# Patient Record
Sex: Female | Born: 1945 | Race: White | Hispanic: No | Smoking: Current every day smoker
Health system: Southern US, Community
[De-identification: ages and names within clinical notes are randomized; demographics above are authoritative.]

## PROBLEM LIST (undated history)

## (undated) DIAGNOSIS — G9340 Encephalopathy, unspecified: Secondary | ICD-10-CM

## (undated) DIAGNOSIS — E785 Hyperlipidemia, unspecified: Secondary | ICD-10-CM

## (undated) DIAGNOSIS — J449 Chronic obstructive pulmonary disease, unspecified: Secondary | ICD-10-CM

## (undated) DIAGNOSIS — J45909 Unspecified asthma, uncomplicated: Secondary | ICD-10-CM

## (undated) DIAGNOSIS — F419 Anxiety disorder, unspecified: Secondary | ICD-10-CM

## (undated) DIAGNOSIS — K209 Esophagitis, unspecified without bleeding: Secondary | ICD-10-CM

## (undated) DIAGNOSIS — Z720 Tobacco use: Secondary | ICD-10-CM

## (undated) DIAGNOSIS — M1990 Unspecified osteoarthritis, unspecified site: Secondary | ICD-10-CM

## (undated) DIAGNOSIS — F0390 Unspecified dementia without behavioral disturbance: Secondary | ICD-10-CM

## (undated) DIAGNOSIS — E871 Hypo-osmolality and hyponatremia: Secondary | ICD-10-CM

## (undated) DIAGNOSIS — F329 Major depressive disorder, single episode, unspecified: Secondary | ICD-10-CM

## (undated) DIAGNOSIS — F32A Depression, unspecified: Secondary | ICD-10-CM

## (undated) DIAGNOSIS — E876 Hypokalemia: Secondary | ICD-10-CM

## (undated) DIAGNOSIS — K219 Gastro-esophageal reflux disease without esophagitis: Secondary | ICD-10-CM

## (undated) DIAGNOSIS — D649 Anemia, unspecified: Secondary | ICD-10-CM

## (undated) DIAGNOSIS — K922 Gastrointestinal hemorrhage, unspecified: Secondary | ICD-10-CM

## (undated) DIAGNOSIS — I10 Essential (primary) hypertension: Secondary | ICD-10-CM

## (undated) DIAGNOSIS — G4700 Insomnia, unspecified: Secondary | ICD-10-CM

## (undated) HISTORY — PX: APPENDECTOMY: SHX54

## (undated) HISTORY — DX: Gastro-esophageal reflux disease without esophagitis: K21.9

## (undated) HISTORY — DX: Essential (primary) hypertension: I10

## (undated) HISTORY — PX: CHOLECYSTECTOMY: SHX55

## (undated) HISTORY — PX: TONSILLECTOMY: SUR1361

## (undated) HISTORY — PX: TUBAL LIGATION: SHX77

## (undated) HISTORY — PX: ABDOMINAL HYSTERECTOMY: SHX81

## (undated) HISTORY — DX: Insomnia, unspecified: G47.00

## (undated) HISTORY — DX: Hyperlipidemia, unspecified: E78.5

## (undated) HISTORY — DX: Tobacco use: Z72.0

## (undated) HISTORY — DX: Unspecified dementia, unspecified severity, without behavioral disturbance, psychotic disturbance, mood disturbance, and anxiety: F03.90

## (undated) HISTORY — DX: Chronic obstructive pulmonary disease, unspecified: J44.9

---

## 2014-05-25 ENCOUNTER — Emergency Department (HOSPITAL_COMMUNITY): Payer: Medicare Other

## 2014-05-25 ENCOUNTER — Encounter (HOSPITAL_COMMUNITY): Payer: Self-pay | Admitting: Emergency Medicine

## 2014-05-25 ENCOUNTER — Inpatient Hospital Stay (HOSPITAL_COMMUNITY)
Admission: EM | Admit: 2014-05-25 | Discharge: 2014-05-30 | DRG: 871 | Disposition: A | Discharge status: Skilled Nursing Facility | Payer: Medicare Other | Attending: Internal Medicine | Admitting: Internal Medicine

## 2014-05-25 DIAGNOSIS — A6004 Herpesviral vulvovaginitis: Secondary | ICD-10-CM | POA: Diagnosis present

## 2014-05-25 DIAGNOSIS — F17200 Nicotine dependence, unspecified, uncomplicated: Secondary | ICD-10-CM | POA: Diagnosis present

## 2014-05-25 DIAGNOSIS — A419 Sepsis, unspecified organism: Secondary | ICD-10-CM | POA: Diagnosis present

## 2014-05-25 DIAGNOSIS — G936 Cerebral edema: Secondary | ICD-10-CM | POA: Diagnosis present

## 2014-05-25 DIAGNOSIS — J449 Chronic obstructive pulmonary disease, unspecified: Secondary | ICD-10-CM | POA: Diagnosis present

## 2014-05-25 DIAGNOSIS — I611 Nontraumatic intracerebral hemorrhage in hemisphere, cortical: Secondary | ICD-10-CM

## 2014-05-25 DIAGNOSIS — Z7902 Long term (current) use of antithrombotics/antiplatelets: Secondary | ICD-10-CM

## 2014-05-25 DIAGNOSIS — I639 Cerebral infarction, unspecified: Secondary | ICD-10-CM | POA: Diagnosis present

## 2014-05-25 DIAGNOSIS — W19XXXA Unspecified fall, initial encounter: Secondary | ICD-10-CM | POA: Diagnosis present

## 2014-05-25 DIAGNOSIS — I610 Nontraumatic intracerebral hemorrhage in hemisphere, subcortical: Secondary | ICD-10-CM

## 2014-05-25 DIAGNOSIS — E871 Hypo-osmolality and hyponatremia: Secondary | ICD-10-CM

## 2014-05-25 DIAGNOSIS — R1310 Dysphagia, unspecified: Secondary | ICD-10-CM | POA: Diagnosis present

## 2014-05-25 DIAGNOSIS — G9340 Encephalopathy, unspecified: Secondary | ICD-10-CM | POA: Diagnosis present

## 2014-05-25 DIAGNOSIS — R6521 Severe sepsis with septic shock: Secondary | ICD-10-CM

## 2014-05-25 DIAGNOSIS — I619 Nontraumatic intracerebral hemorrhage, unspecified: Secondary | ICD-10-CM | POA: Diagnosis present

## 2014-05-25 DIAGNOSIS — Z8673 Personal history of transient ischemic attack (TIA), and cerebral infarction without residual deficits: Secondary | ICD-10-CM | POA: Diagnosis not present

## 2014-05-25 DIAGNOSIS — E876 Hypokalemia: Secondary | ICD-10-CM | POA: Diagnosis present

## 2014-05-25 DIAGNOSIS — I616 Nontraumatic intracerebral hemorrhage, multiple localized: Secondary | ICD-10-CM

## 2014-05-25 DIAGNOSIS — J4489 Other specified chronic obstructive pulmonary disease: Secondary | ICD-10-CM | POA: Diagnosis present

## 2014-05-25 DIAGNOSIS — A0472 Enterocolitis due to Clostridium difficile, not specified as recurrent: Secondary | ICD-10-CM | POA: Diagnosis present

## 2014-05-25 DIAGNOSIS — R4182 Altered mental status, unspecified: Secondary | ICD-10-CM | POA: Diagnosis present

## 2014-05-25 HISTORY — DX: Encephalopathy, unspecified: G93.40

## 2014-05-25 HISTORY — DX: Gastrointestinal hemorrhage, unspecified: K92.2

## 2014-05-25 HISTORY — DX: Unspecified osteoarthritis, unspecified site: M19.90

## 2014-05-25 HISTORY — DX: Hypo-osmolality and hyponatremia: E87.1

## 2014-05-25 HISTORY — DX: Anemia, unspecified: D64.9

## 2014-05-25 HISTORY — DX: Anxiety disorder, unspecified: F41.9

## 2014-05-25 HISTORY — DX: Esophagitis, unspecified: K20.9

## 2014-05-25 HISTORY — DX: Unspecified asthma, uncomplicated: J45.909

## 2014-05-25 HISTORY — DX: Major depressive disorder, single episode, unspecified: F32.9

## 2014-05-25 HISTORY — DX: Depression, unspecified: F32.A

## 2014-05-25 HISTORY — DX: Hypokalemia: E87.6

## 2014-05-25 HISTORY — DX: Esophagitis, unspecified without bleeding: K20.90

## 2014-05-25 HISTORY — DX: Chronic obstructive pulmonary disease, unspecified: J44.9

## 2014-05-25 LAB — COMPREHENSIVE METABOLIC PANEL
ALT: 20 U/L (ref 0–35)
ANION GAP: 19 — AB (ref 5–15)
AST: 36 U/L (ref 0–37)
Albumin: 3.4 g/dL — ABNORMAL LOW (ref 3.5–5.2)
Alkaline Phosphatase: 87 U/L (ref 39–117)
BUN: 11 mg/dL (ref 6–23)
CO2: 25 mEq/L (ref 19–32)
Calcium: 9.9 mg/dL (ref 8.4–10.5)
Chloride: 78 mEq/L — ABNORMAL LOW (ref 96–112)
Creatinine, Ser: 0.78 mg/dL (ref 0.50–1.10)
GFR calc non Af Amer: 85 mL/min — ABNORMAL LOW (ref >90)
GLUCOSE: 113 mg/dL — AB (ref 70–99)
Potassium: 3.3 mEq/L — ABNORMAL LOW (ref 3.7–5.3)
Sodium: 122 mEq/L — ABNORMAL LOW (ref 137–147)
Total Bilirubin: 1 mg/dL (ref 0.3–1.2)
Total Protein: 7.5 g/dL (ref 6.0–8.3)

## 2014-05-25 LAB — URINALYSIS, ROUTINE W REFLEX MICROSCOPIC
Bilirubin Urine: NEGATIVE
GLUCOSE, UA: NEGATIVE mg/dL
Hgb urine dipstick: NEGATIVE
Ketones, ur: NEGATIVE mg/dL
LEUKOCYTES UA: NEGATIVE
Nitrite: NEGATIVE
PROTEIN: 30 mg/dL — AB
Specific Gravity, Urine: 1.018 (ref 1.005–1.030)
UROBILINOGEN UA: 0.2 mg/dL (ref 0.0–1.0)
pH: 7.5 (ref 5.0–8.0)

## 2014-05-25 LAB — CBC WITH DIFFERENTIAL/PLATELET
Basophils Absolute: 0 10*3/uL (ref 0.0–0.1)
Basophils Relative: 0 % (ref 0–1)
EOS ABS: 0 10*3/uL (ref 0.0–0.7)
Eosinophils Relative: 0 % (ref 0–5)
HCT: 35 % — ABNORMAL LOW (ref 36.0–46.0)
Hemoglobin: 12.6 g/dL (ref 12.0–15.0)
LYMPHS ABS: 0.8 10*3/uL (ref 0.7–4.0)
Lymphocytes Relative: 4 % — ABNORMAL LOW (ref 12–46)
MCH: 29.4 pg (ref 26.0–34.0)
MCHC: 36 g/dL (ref 30.0–36.0)
MCV: 81.6 fL (ref 78.0–100.0)
Monocytes Absolute: 1 10*3/uL (ref 0.1–1.0)
Monocytes Relative: 5 % (ref 3–12)
Neutro Abs: 18.4 10*3/uL — ABNORMAL HIGH (ref 1.7–7.7)
Neutrophils Relative %: 91 % — ABNORMAL HIGH (ref 43–77)
Platelets: 480 10*3/uL — ABNORMAL HIGH (ref 150–400)
RBC: 4.29 MIL/uL (ref 3.87–5.11)
RDW: 14.6 % (ref 11.5–15.5)
WBC: 20.1 10*3/uL — ABNORMAL HIGH (ref 4.0–10.5)

## 2014-05-25 LAB — AMMONIA: Ammonia: 22 umol/L (ref 11–60)

## 2014-05-25 LAB — URINE MICROSCOPIC-ADD ON

## 2014-05-25 LAB — I-STAT CG4 LACTIC ACID, ED: Lactic Acid, Venous: 2.12 mmol/L (ref 0.5–2.2)

## 2014-05-25 MED ORDER — VANCOMYCIN HCL IN DEXTROSE 750-5 MG/150ML-% IV SOLN: 750.0000 mg | Freq: Two times a day (BID) | INTRAVENOUS | Status: DC

## 2014-05-25 MED ORDER — SODIUM CHLORIDE 0.9 % IV BOLUS (SEPSIS)
30.0000 mL/kg | Freq: Once | INTRAVENOUS | Status: AC
  Administered 2014-05-25: 1100 mL via INTRAVENOUS

## 2014-05-25 MED ORDER — PIPERACILLIN-TAZOBACTAM 3.375 G IVPB 30 MIN
3.3750 g | Freq: Once | INTRAVENOUS | Status: AC
Start: 2014-05-25 — End: 2014-05-25
  Administered 2014-05-25: 3.375 g via INTRAVENOUS
  Filled 2014-05-25: qty 50

## 2014-05-25 MED ORDER — SODIUM CHLORIDE 0.9 % IV BOLUS (SEPSIS)
1000.0000 mL | Freq: Once | INTRAVENOUS | Status: AC
  Administered 2014-05-25: 1000 mL via INTRAVENOUS

## 2014-05-25 MED ORDER — LORAZEPAM 2 MG/ML IJ SOLN
1.0000 mg | Freq: Once | INTRAMUSCULAR | Status: AC
  Administered 2014-05-25: 1 mg via INTRAVENOUS
  Filled 2014-05-25: qty 1

## 2014-05-25 MED ORDER — PIPERACILLIN-TAZOBACTAM 3.375 G IVPB
3.3750 g | Freq: Three times a day (TID) | INTRAVENOUS | Status: DC
  Filled 2014-05-25: qty 50

## 2014-05-25 MED ORDER — VANCOMYCIN HCL IN DEXTROSE 1-5 GM/200ML-% IV SOLN
1000.0000 mg | Freq: Once | INTRAVENOUS | Status: AC
  Administered 2014-05-25: 1000 mg via INTRAVENOUS
  Filled 2014-05-25: qty 200

## 2014-05-25 MED ORDER — SODIUM CHLORIDE 0.9 % IV SOLN
1000.0000 mL | INTRAVENOUS | Status: DC
  Administered 2014-05-25: 1000 mL via INTRAVENOUS

## 2014-05-25 MED ORDER — ACETAMINOPHEN 650 MG RE SUPP
975.0000 mg | Freq: Once | RECTAL | Status: AC
  Administered 2014-05-25: 975 mg via RECTAL
  Filled 2014-05-25 (×2): qty 1

## 2014-05-25 NOTE — ED Notes (Signed)
EDPA aware of temperature.

## 2014-05-25 NOTE — ED Notes (Signed)
Pt to radiology.

## 2014-05-25 NOTE — H&P (Signed)
PULMONARY / CRITICAL CARE MEDICINE   Name: Danielle [name] MRN: [id number] DOB: 1945/12/27    ADMISSION DATE:  05/25/2014 CONSULTATION DATE:  05/25/2014  REFERRING MD :  EDP  CHIEF COMPLAINT:  AMS, fever  INITIAL PRESENTATION: 68 y.o. F brought to [facility] on 9/3 from [facility] assisted living with complaints of AMS and fever to 101.3.  Pt was transferred from [facility] to [facility] earlier in the evening and apparently suffered a fall around 730 AM day of presentation.  She was cleared by a physician following fall.  In ED, pt was found to have a small ICH.  Neurology was consulted and [facility].  PCCM was consulted for admission.  STUDIES:  CT Head 9/3 >>> Acute inferior frontal hemorrhage affecting the left gyrus  rectus, roughly 9 x 19 mm, with a small halo of surrounding edema suggesting this bleed could be hours or even a few days old. No visible subarachnoid blood, skull fracture, or sinus air-fluid level. Chronic changes with generalized atrophy.   SIGNIFICANT EVENTS: 9/3 - presented to [place] ED, transferred to [facility] ICU.  HISTORY OF PRESENT ILLNESS:  Pt is encephalopathic; therefore, this HPI is obtained from chart review. Danielle [name] is a 68 y.o. F with PMH as outlined below and who resides at [facility] assisted living facility.  She was a resident at [facility]; however, was transferred to [facility] evening of 9/3.  Per EDP notes, pt suffered a fall at 7:30 AM day of presentation but was cleared by a physician thereafter. Later that evening after transfer to [facility], pt became febrile to 101.3 and had AMS.  She was subsequently send to the [facility] ED for further evaluation.  Per pt's daughter, pt is usually A&O x 3 at baseline.  She was treated for PNA a few weeks ago as well as C.Diff roughly 6 weeks ago.  In ED, pt noted to have hyponatremia (122) with WBC count of 20.  Note, pt has hypoosmolar hyponatremia listed on her PMH.  In addition, CT  Head revealed a small ICH with mild surrounding edema.  Neurology was consulted and requested transfer to [facility] for which PCCM was consulted for admission.  On arrival to ICU, she denies headache, confusion, dizziness, vision changes, chest pain, SOB, cough, abd pain, N/V/D, fevers/chills/sweats.  PAST MEDICAL HISTORY :  Past Medical History  Diagnosis Date  . Encephalopathy   . Hyposmolality and/or hyponatremia   . GIB (gastrointestinal bleeding)   . Anemia   . Chronic airway obstruction   . Hypopotassemia   . Depression   . Anxiety   . Esophagitis    History reviewed. No pertinent past surgical history. Prior to Admission medications   Medication Sig Start Date End Date Taking? Authorizing Provider  atorvastatin (LIPITOR) 40 MG tablet Take 40 mg by mouth daily.   Yes Historical Provider, MD  calcium carbonate (OS-CAL) 600 MG TABS tablet Take 600 mg by mouth 2 (two) times daily with a meal.   Yes Historical Provider, MD  clopidogrel (PLAVIX) 75 MG tablet Take 75 mg by mouth daily.   Yes Historical Provider, MD  Fluticasone-Salmeterol (ADVAIR) 250-50 MCG/DOSE AEPB Inhale 1 puff into the lungs 2 (two) times daily.   Yes Historical Provider, MD  iron polysaccharides (NIFEREX) 150 MG capsule Take 150 mg by mouth 2 (two) times daily.   Yes Historical Provider, MD  loratadine (CLARITIN) 10 MG tablet Take 10 mg by mouth daily.   Yes Historical Provider,  MD  metoprolol tartrate (LOPRESSOR) 25 MG tablet Take 25 mg by mouth 2 (two) times daily.   Yes Historical Provider, MD  mirtazapine (REMERON) 30 MG tablet Take 30 mg by mouth at bedtime.   Yes Historical Provider, MD  Multiple Vitamin (MULTIVITAMIN WITH MINERALS) TABS tablet Take 1 tablet by mouth daily.   Yes Historical Provider, MD  pantoprazole (PROTONIX) 40 MG tablet Take 40 mg by mouth 2 (two) times daily.   Yes Historical Provider, MD  potassium chloride SA (K-DUR,KLOR-CON) 20 MEQ tablet Take 20 mEq by mouth daily.   Yes Historical  Provider, MD  sertraline (ZOLOFT) 50 MG tablet Take 50 mg by mouth daily.   Yes Historical Provider, MD  Thiamine Mononitrate (VITAMIN B1 PO) Take 1 tablet by mouth daily.   Yes Historical Provider, MD  zinc oxide 20 % ointment Apply 1 application topically as needed for irritation.   Yes Historical Provider, MD   No Known Allergies  FAMILY HISTORY:  No family history on file. SOCIAL HISTORY:  has no tobacco, alcohol, and drug history on file.  REVIEW OF SYSTEMS:   All negative; except for those that are bolded, which indicate positives.  Constitutional: weight loss, weight gain, night sweats, fevers, chills, fatigue, weakness.  HEENT: headaches, sore throat, sneezing, nasal congestion, post nasal drip, difficulty swallowing, tooth/dental problems, visual complaints, visual changes, ear aches. Neuro: difficulty with speech, weakness, numbness, ataxia. CV:  chest pain, orthopnea, PND, swelling in lower extremities, dizziness, palpitations, syncope.  Resp: cough, hemoptysis, dyspnea, wheezing. GI  heartburn, indigestion, abdominal pain, nausea, vomiting, diarrhea, constipation, change in bowel habits, loss of appetite, hematemesis, melena, hematochezia.  GU: dysuria, change in color of urine, urgency or frequency, flank pain, hematuria. MSK: joint pain or swelling, decreased range of motion. Psych: change in mood or affect, depression, anxiety, suicidal ideations, homicidal ideations. Skin: rash, itching, bruising.  SUBJECTIVE:  Is tired, wants to sleep.  Denies any symptoms.  VITAL SIGNS: Temp:  [100.9 F (38.3 C)-101.7 F (38.7 C)] 101.7 F (38.7 C) (09/03 2120) Pulse Rate:  [110-131] 110 (09/03 2230) Resp:  [20-29] 29 (09/03 2030) BP: (108-159)/(46-89) 128/58 mmHg (09/03 2230) SpO2:  [94 %-97 %] 97 % (09/03 2230) Weight:  [70 kg (154 lb 5.2 oz)] 70 kg (154 lb 5.2 oz) (09/03 2028) HEMODYNAMICS:   VENTILATOR SETTINGS:   INTAKE / OUTPUT: Intake/Output   None     PHYSICAL  EXAMINATION: General: Elderly female, in NAD. Neuro: Somnolent but easily arouseable.  A&O x 3, non-focal.  HEENT: [place]/AT. PERRL, sclerae anicteric. Cardiovascular: RRR, no M/R/G.  Lungs: Respirations even and unlabored.  CTA bilaterally, No W/R/R.  Abdomen: BS x 4, soft, NT/ND.  GU: extensive vaginal herpes Musculoskeletal: No gross deformities, no edema.  Skin: Intact, warm, no rashes. 4cm in diameter round erythematous macule just lateral and distal to left knee.  LABS:  CBC  Recent Labs Lab 05/25/14 1832  WBC 20.1*  HGB 12.6  HCT 35.0*  PLT 480*   Coag's No results found for this basename: APTT, INR,  in the last 168 hours BMET  Recent Labs Lab 05/25/14 1832  NA 122*  K 3.3*  CL 78*  CO2 25  BUN 11  CREATININE 0.78  GLUCOSE 113*   Electrolytes  Recent Labs Lab 05/25/14 1832  CALCIUM 9.9   Sepsis Markers  Recent Labs Lab 05/25/14 1906  LATICACIDVEN 2.12   ABG No results found for this basename: PHART, PCO2ART, PO2ART,  in the last 168 hours Liver  Enzymes  Recent Labs Lab 05/25/14 1832  AST 36  ALT 20  ALKPHOS 87  BILITOT 1.0  ALBUMIN 3.4*   Cardiac Enzymes No results found for this basename: TROPONINI, PROBNP,  in the last 168 hours Glucose No results found for this basename: GLUCAP,  in the last 168 hours  Imaging Dg Chest 1 View  05/25/2014   CLINICAL DATA:  Fever, altered mental status  EXAM: CHEST - 1 VIEW  COMPARISON:  Chest radiograph 12/28/2013  FINDINGS: Multiple leads overlie the patient. Stable cardiac and mediastinal contours. No consolidative pulmonary opacities. Stable calcified granuloma left mid lung. No pleural effusion pneumothorax.  IMPRESSION: No acute cardiopulmonary process.   Electronically Signed   By: Annia Belt M.D.   On: 05/25/2014 20:02   Ct Head Wo Contrast  05/25/2014   CLINICAL DATA:  Fever, altered mental status, combative.  EXAM: CT HEAD WITHOUT CONTRAST  TECHNIQUE: Contiguous axial images were obtained from  the base of the skull through the vertex without contrast.  COMPARISON:  CT head 12/28/2013.  CT head 07/01/2010.  FINDINGS: Generalized atrophy. Chronic microvascular ischemic change. Remote areas of lacunar cerebral infarction are redemonstrated similar in distribution to prior MR.  There is an acute inferior frontal hemorrhage affecting the LEFT gyrus rectus. This measures roughly 9 x 19 mm cross-section as a small halo of surrounding edema. No features to suggest vasogenic edema. No subfrontal masses are evident. There is no visible adjacent subarachnoid blood. There is advanced vascular calcification in the LEFT carotid siphon but no suggestion of a berry aneurysm. I do not see a skull fracture or significant scalp hematoma. There is no sinus air-fluid level to suggest hemorrhage or sinus opacities to suggest inflammation. Other than trauma, with a parenchymal hematoma, a hyperdense metastasis could have this appearance but is not felt likely. The inferior frontal region is an unusual location for lobar hypertensive bleed.  Compared with prior scans, no abnormality was seen in this level previously.  IMPRESSION: Acute LEFT inferior frontal hemorrhage affecting the LEFT gyrus rectus, roughly 9 x 19 mm cross-section, with a small halo of surrounding edema suggesting this bleed could be hours or even a few days old. No visible subarachnoid blood, skull fracture, or sinus air-fluid level. No similar abnormality on prior CT scans.  Chronic changes as described with generalized atrophy, small vessel disease, and numerous areas of remote infarction.  Critical Value/emergent results were called by telephone at the time of interpretation on 05/25/2014 at 9:33 pm to Lorre Nick, MD, who verbally acknowledged these results.   Electronically Signed   By: Davonna Belling M.D.   On: 05/25/2014 21:37    ASSESSMENT / PLAN:  PULMONARY A: Protecting airway At risk respiratory failure if ICH / edema increases ? COPD P:    Low threshold for intubation if respiratory status declines or inability to protect airway. Continue outpatient Advair Elwin Sleight per hospital formulary).  CARDIOVASCULAR A:  BP control P:  Given ICH, Labetalol PRN to maintain SBP < 160. Hold outpatient atorvastatin, plavix, lopressor.  RENAL A:   Hx hypoosmolar hyponatremia Hypokalemia P:   NS @ 100. Potassium x 4 runs Check serum and urine osmolality. BMP in AM.  GASTROINTESTINAL A:   Nutrition Hx Esophagitis P:   NPO. Pantoprazole.  HEMATOLOGIC A:   VTE Prophylaxis P:  SCD's only. CBC in AM.  INFECTIOUS A:   Possible sepsis - though not impressed at this point.  Fever and leukocytosis could represent acute phase reactants in  setting of ICH.  P:   BCx2 9/3 >>> UCx 9/3 >>> Abx: Vanc, start date 9/3, day 1/x. Abx: Zosyn, start date 9/3, day 1/x. PCT algorithm to limit abx exposure with low threshold to stop.  ENDOCRINE A:   R/o hypothyroidism (as secondary cause of hyponatremia) P:   Check TSH.  NEUROLOGIC A:   ICH - unclear whether secondary to trauma from fall vs CVA Acute encephalopathy Hx Depression  Hx Anxiety P:   Per neuro recs. Hold outpatient remeron, zoloft.   TODAY'S SUMMARY: 68 y.o. F from assisted living, brought to ED with AMS and fever.  Found to have small ICH.  Unclear whether this was result from fall earlier on day of presentation, or from CVA.  Empirically treating for sepsis due to fever and leukocytosis, though not impressed.  Checking PCT, if unremarkable, consider stopping abx. Neuro following for further evaluation / management of ICH.   Rutherford Guys, [place] [facility] Pgr: [phone]  or [phone] 05/25/2014, 11:13 PM  Reviewed above, and examined.  68 yo from assisted living with fever, and acute encephalopathy.  Found to have ICH, hyponatremia.  Appreciate assistance from neurology.  Continue Abx pending cx results.   Continue NS IV fluids and f/u urine studies.  Monitor mental status in ICU.  CC time 60 minutes.  Coralyn Helling, MD [facility] Pulmonary/Critical Care 05/26/2014, 1:47 AM Pager:  [phone] After 3pm call: [phone]

## 2014-05-25 NOTE — ED Notes (Addendum)
PER EMS - pt from [facility] with c/o fever and AMS x1 day.  Pt is a new resident there.  Pt c/o abd pain. Pt mildly uncooperative per EMS.    NS  Tylenol PO

## 2014-05-25 NOTE — ED Notes (Signed)
Pt to CT

## 2014-05-25 NOTE — ED Provider Notes (Signed)
CSN: [id number]     Arrival date & time 05/25/14  1803 History   First MD Initiated Contact with Patient 05/25/14 1822     Chief Complaint  Patient presents with  . Fever  . Altered Mental Status     (Consider location/radiation/quality/duration/timing/severity/associated sxs/prior Treatment) HPI Comments: The patient is a 68 year old female past no history of anemia, cephalopathy, depression, coming from [facility] assisted-living facility due to fever and altered mental status.  Per the patient's daughter the patient was transferred from [facility] to [facility] a few hours ago.  Per facility note the patient's temperature is 101.3 with a productive cough and increased confusion. Per the patient's daughter the patient is usually alert, oriented x3 at baseline. She reports she has not seen the patient in several weeks however she was treated for pneumonia a few weeks ago and C. difficile approximately 6 weeks ago. The patient's daughter also reports she was contacted due to to a fall earlier this morning at 0730 and was evaluated by a physician without concerning exam. Little 5 caveat applies due to altered mental status  The history is provided by the patient. No language interpreter was used.    Past Medical History  Diagnosis Date  . Encephalopathy   . Hyposmolality and/or hyponatremia   . GIB (gastrointestinal bleeding)   . Anemia   . Chronic airway obstruction   . Hypopotassemia   . Depression   . Anxiety   . Esophagitis    History reviewed. No pertinent past surgical history. No family history on file. History  Substance Use Topics  . Smoking status: Not on file  . Smokeless tobacco: Not on file  . Alcohol Use: Not on file   OB History   Grav Para Term Preterm Abortions TAB SAB Ect Mult Living                 Review of Systems  Unable to perform ROS: Mental status change      Allergies  Review of patient's allergies indicates no known allergies.  Home  Medications   Prior to Admission medications   Not on File   BP 141/89  Pulse 126  Temp(Src) 100.9 F (38.3 C) (Rectal)  Resp 20  SpO2 96% Physical Exam  Nursing note and vitals reviewed. Constitutional: She appears well-developed. She appears ill.  HENT:  Head: Normocephalic and atraumatic.  Mouth/Throat: Uvula is midline. Mucous membranes are dry.  Neck: Normal range of motion. Neck supple.  Cardiovascular: Regular rhythm.  Tachycardia present.   Pulmonary/Chest: Effort normal. She has rales.  Abdominal: Soft. There is no tenderness. There is no rebound and no guarding.  Genitourinary: There is lesion on the right labia. There is lesion on the left labia.  Numerous genital warts, severe peritoneal disease. Mild erythremic.   Neurological: She is alert. GCS eye subscore is 4. GCS verbal subscore is 2. GCS motor subscore is 6.  Patient moans in response. Moves all 4 extremities.  Skin: Skin is warm. She is not diaphoretic.    ED Course  Procedures (including critical care time) CRITICAL CARE Performed by: Clabe Seal   Total critical care time: 45  Critical care time was exclusive of separately billable procedures and treating other patients.  Critical care was necessary to treat or prevent imminent or life-threatening deterioration.  Critical care was time spent personally by me on the following activities: development of treatment plan with patient and/or surrogate as well as nursing, discussions with consultants, evaluation of  patient's response to treatment, examination of patient, obtaining history from patient or surrogate, ordering and performing treatments and interventions, ordering and review of laboratory studies, ordering and review of radiographic studies, pulse oximetry and re-evaluation of patient's condition.  Labs Review Labs Reviewed - No data to display  Results for orders placed during the hospital encounter of 05/25/14  CBC WITH DIFFERENTIAL       Result Value Ref Range   WBC 20.1 (*) 4.0 - 10.5 K/uL   RBC 4.29  3.87 - 5.11 MIL/uL   Hemoglobin 12.6  12.0 - 15.0 g/dL   HCT 16.1 (*) 09.6 - 04.5 %   MCV 81.6  78.0 - 100.0 fL   MCH 29.4  26.0 - 34.0 pg   MCHC 36.0  30.0 - 36.0 g/dL   RDW 40.9  81.1 - 91.4 %   Platelets 480 (*) 150 - 400 K/uL   Neutrophils Relative % 91 (*) 43 - 77 %   Neutro Abs 18.4 (*) 1.7 - 7.7 K/uL   Lymphocytes Relative 4 (*) 12 - 46 %   Lymphs Abs 0.8  0.7 - 4.0 K/uL   Monocytes Relative 5  3 - 12 %   Monocytes Absolute 1.0  0.1 - 1.0 K/uL   Eosinophils Relative 0  0 - 5 %   Eosinophils Absolute 0.0  0.0 - 0.7 K/uL   Basophils Relative 0  0 - 1 %   Basophils Absolute 0.0  0.0 - 0.1 K/uL  COMPREHENSIVE METABOLIC PANEL      Result Value Ref Range   Sodium 122 (*) 137 - 147 mEq/L   Potassium 3.3 (*) 3.7 - 5.3 mEq/L   Chloride 78 (*) 96 - 112 mEq/L   CO2 25  19 - 32 mEq/L   Glucose, Bld 113 (*) 70 - 99 mg/dL   BUN 11  6 - 23 mg/dL   Creatinine, Ser 7.82  0.50 - 1.10 mg/dL   Calcium 9.9  8.4 - 95.6 mg/dL   Total Protein 7.5  6.0 - 8.3 g/dL   Albumin 3.4 (*) 3.5 - 5.2 g/dL   AST 36  0 - 37 U/L   ALT 20  0 - 35 U/L   Alkaline Phosphatase 87  39 - 117 U/L   Total Bilirubin 1.0  0.3 - 1.2 mg/dL   GFR calc non Af Amer 85 (*) >90 mL/min   GFR calc Af Amer >90  >90 mL/min   Anion gap 19 (*) 5 - 15  URINALYSIS, ROUTINE W REFLEX MICROSCOPIC      Result Value Ref Range   Color, Urine AMBER (*) YELLOW   APPearance CLEAR  CLEAR   Specific Gravity, Urine 1.018  1.005 - 1.030   pH 7.5  5.0 - 8.0   Glucose, UA NEGATIVE  NEGATIVE mg/dL   Hgb urine dipstick NEGATIVE  NEGATIVE   Bilirubin Urine NEGATIVE  NEGATIVE   Ketones, ur NEGATIVE  NEGATIVE mg/dL   Protein, ur 30 (*) NEGATIVE mg/dL   Urobilinogen, UA 0.2  0.0 - 1.0 mg/dL   Nitrite NEGATIVE  NEGATIVE   Leukocytes, UA NEGATIVE  NEGATIVE  AMMONIA      Result Value Ref Range   Ammonia 22  11 - 60 umol/L  URINE MICROSCOPIC-ADD ON      Result Value Ref  Range   Squamous Epithelial / LPF RARE  RARE   WBC, UA 0-2  <3 WBC/hpf   RBC / HPF 0-2  <3 RBC/hpf   Bacteria,  UA FEW (*) RARE   Casts HYALINE CASTS (*) NEGATIVE  I-STAT CG4 LACTIC ACID, ED      Result Value Ref Range   Lactic Acid, Venous 2.12  0.5 - 2.2 mmol/L    Imaging Review Dg Chest 1 View  05/25/2014   CLINICAL DATA:  Fever, altered mental status  EXAM: CHEST - 1 VIEW  COMPARISON:  Chest radiograph 12/28/2013  FINDINGS: Multiple leads overlie the patient. Stable cardiac and mediastinal contours. No consolidative pulmonary opacities. Stable calcified granuloma left mid lung. No pleural effusion pneumothorax.  IMPRESSION: No acute cardiopulmonary process.   Electronically Signed   By: Annia Belt M.D.   On: 05/25/2014 20:02   Ct Head Wo Contrast  05/25/2014   CLINICAL DATA:  Fever, altered mental status, combative.  EXAM: CT HEAD WITHOUT CONTRAST  TECHNIQUE: Contiguous axial images were obtained from the base of the skull through the vertex without contrast.  COMPARISON:  CT head 12/28/2013.  CT head 07/01/2010.  FINDINGS: Generalized atrophy. Chronic microvascular ischemic change. Remote areas of lacunar cerebral infarction are redemonstrated similar in distribution to prior MR.  There is an acute inferior frontal hemorrhage affecting the LEFT gyrus rectus. This measures roughly 9 x 19 mm cross-section as a small halo of surrounding edema. No features to suggest vasogenic edema. No subfrontal masses are evident. There is no visible adjacent subarachnoid blood. There is advanced vascular calcification in the LEFT carotid siphon but no suggestion of a berry aneurysm. I do not see a skull fracture or significant scalp hematoma. There is no sinus air-fluid level to suggest hemorrhage or sinus opacities to suggest inflammation. Other than trauma, with a parenchymal hematoma, a hyperdense metastasis could have this appearance but is not felt likely. The inferior frontal region is an unusual location  for lobar hypertensive bleed.  Compared with prior scans, no abnormality was seen in this level previously.  IMPRESSION: Acute LEFT inferior frontal hemorrhage affecting the LEFT gyrus rectus, roughly 9 x 19 mm cross-section, with a small halo of surrounding edema suggesting this bleed could be hours or even a few days old. No visible subarachnoid blood, skull fracture, or sinus air-fluid level. No similar abnormality on prior CT scans.  Chronic changes as described with generalized atrophy, small vessel disease, and numerous areas of remote infarction.  Critical Value/emergent results were called by telephone at the time of interpretation on 05/25/2014 at 9:33 pm to Lorre Nick, MD, who verbally acknowledged these results.   Electronically Signed   By: Davonna Belling M.D.   On: 05/25/2014 21:37     EKG Interpretation   Date/Time:  Thursday May 25 2014 18:13:54 EDT Ventricular Rate:  136 PR Interval:  116 QRS Duration: 89 QT Interval:  310 QTC Calculation: 466 R Axis:   -83 Text Interpretation:  Sinus tachycardia Ventricular premature complex  Abnormal R-wave progression, late transition Inferior infarct, old  Baseline wander in lead(s) II III aVF Confirmed by ALLEN  MD, ANTHONY  ([id number]) on 05/25/2014 7:18:40 PM      MDM   Final diagnoses:  CVA (cerebral vascular accident)  Sepsis(995.91)  Hyponatremia   Patient presents from assisted living facility with altered metal status and fever of 100.9, 1 g Tylenol given by EMS. Sepsis labs, fluids, urine, x-ray per protocol ordered. Call to pts daughter. 7:09 PM Discussed pt history with Henriette Combs at [facility] and reports  patient had increased agitation was given Phenergan and Ativan. 7:54 PM Discussed patient history with Lorin Mercy, [facility],  she reports increase in agitation over the last several days, and was upset about recent relocation. reports she had bowel and bladder incontinence today. Was evaluated by a nurse  practitioner this morning denies fall but reports patient was sitting on the floor. And was given Ativan 0.5 IM for agitation at 11:50am, Phenergan and suppository 0845, Large BM in room after suppository. Vomit x1 today Dr. Isaias Cowman also evaluated the patient during this encounter.   UA negative, x-ray negative, leukocytosis at 21.0 no obvious source of infection plan to treat her with IV Vanc and Zosyn.  9:38 PM discussed patient history, condition with hospitalist to advises weight for neurology consult prior to admission. Re-evaluation: Patient with persistent fever, rectal tylenol given, persistently tachycardic at 110's despite 2L.  Discussed pt history, condition, results with Dr. Roseanne Reno, neurology who agrees to consult the patient advises admission to critical care. Call to daughter x2 and left message. Discussed pt history condition with Dr. Molli Knock who agrees to the admission.  Patient's daughter was contacted with results and plan to move the patient to [facility]. Meds given in ED:  Medications  sodium chloride 0.9 % bolus 2,100 mL (1,100 mLs Intravenous New Bag/Given 05/25/14 2123)    Followed by  0.9 %  sodium chloride infusion (1,000 mLs Intravenous New Bag/Given 05/25/14 2124)  piperacillin-tazobactam (ZOSYN) IVPB 3.375 g (not administered)  vancomycin (VANCOCIN) IVPB 750 mg/150 ml premix (not administered)  sodium chloride 0.9 % bolus 1,000 mL (0 mLs Intravenous Stopped 05/25/14 2120)  piperacillin-tazobactam (ZOSYN) IVPB 3.375 g (0 g Intravenous Stopped 05/25/14 2202)  vancomycin (VANCOCIN) IVPB 1000 mg/200 mL premix (0 mg Intravenous Stopped 05/25/14 2343)  LORazepam (ATIVAN) injection 1 mg (1 mg Intravenous Given 05/25/14 2041)  acetaminophen (TYLENOL) suppository 975 mg (975 mg Rectal Given 05/25/14 2124)    New Prescriptions   No medications on file     Mellody Drown, PA-C 05/26/14 0017

## 2014-05-25 NOTE — ED Notes (Addendum)
Initial Contact - pt agitated, restless, awake/alert, but unable to follow directions or answer questions.  Skin p/hot/dry.  Approx 2" diameter sized red area to L lateral calf, red areas to toes noted, what appears to be genital warts present on labia.  RR even/un-lab. LS congested, difficult to assess 2/2 poor inspiratory effort.  Congested cough noted.  ST on monitor.  MAEI, +csm/+pulses.  Changed to hospital gown, placed to cardiac/02 monitor.  NAD.

## 2014-05-25 NOTE — ED Notes (Signed)
Bed: WA17 Expected date:  Expected time:  Means of arrival:  Comments: AMS, fever

## 2014-05-25 NOTE — Consult Note (Signed)
Referring Physician: Claudette Head    Chief Complaint: Encephalopathic state with acute intracerebral hemorrhage.  HPI: Danielle [name] is an 68 y.o. female history of gastrointestinal bleeding, anemia, depression and anxiety, as well as hyperlipidemia, who was brought to the emergency room from assisted living facility with altered mental status. It's unclear when patient was last seen well. CT scan of her head showed a 9 X 19 mm left inferior frontal hemorrhage with surrounding edema. No subarachnoid blood is seen. Patient also is hyponatremic with sodium of 122. She was febrile and WBC count was 20.1K. Urinalysis was unremarkable. Serum ammonia was normal. Blood cultures are pending. Patient was started on Zosyn and vancomycin for antibiotic coverage.  LSN: Unclear tPA Given: No: Acute/subacute ICH mRankin: 4  Past Medical History  Diagnosis Date  . Encephalopathy   . Hyposmolality and/or hyponatremia   . GIB (gastrointestinal bleeding)   . Anemia   . Chronic airway obstruction   . Hypopotassemia   . Depression   . Anxiety   . Esophagitis     No family history on file.   Medications: I have reviewed the patient's current medications.  ROS: History obtained from unobtainable from patient due to mental status   Physical Examination: Blood pressure 128/58, pulse 110, temperature 102 F (38.9 C), temperature source Rectal, resp. rate 29, weight 70 kg (154 lb 5.2 oz), SpO2 97.00%.  Neurologic Examination: Patient was stuporous, agitated and uncooperative. Speech output was unintelligible. Pupils were equal and reacted normally to light. Extraocular movements were full and conjugate on right and left lateral gaze. No facial weakness was noted. Muscle tone was flaccid at rest with full range of motion of extremities. Strength was normal and equal throughout. Deep tendon reflexes were 1+ in the upper extremities and at the knees absent at the ankles. Plantar responses were mute  bilaterally.  Dg Chest 1 View  05/25/2014   CLINICAL DATA:  Fever, altered mental status  EXAM: CHEST - 1 VIEW  COMPARISON:  Chest radiograph 12/28/2013  FINDINGS: Multiple leads overlie the patient. Stable cardiac and mediastinal contours. No consolidative pulmonary opacities. Stable calcified granuloma left mid lung. No pleural effusion pneumothorax.  IMPRESSION: No acute cardiopulmonary process.   Electronically Signed   By: Annia Belt M.D.   On: 05/25/2014 20:02   Ct Head Wo Contrast  05/25/2014   CLINICAL DATA:  Fever, altered mental status, combative.  EXAM: CT HEAD WITHOUT CONTRAST  TECHNIQUE: Contiguous axial images were obtained from the base of the skull through the vertex without contrast.  COMPARISON:  CT head 12/28/2013.  CT head 07/01/2010.  FINDINGS: Generalized atrophy. Chronic microvascular ischemic change. Remote areas of lacunar cerebral infarction are redemonstrated similar in distribution to prior MR.  There is an acute inferior frontal hemorrhage affecting the LEFT gyrus rectus. This measures roughly 9 x 19 mm cross-section as a small halo of surrounding edema. No features to suggest vasogenic edema. No subfrontal masses are evident. There is no visible adjacent subarachnoid blood. There is advanced vascular calcification in the LEFT carotid siphon but no suggestion of a berry aneurysm. I do not see a skull fracture or significant scalp hematoma. There is no sinus air-fluid level to suggest hemorrhage or sinus opacities to suggest inflammation. Other than trauma, with a parenchymal hematoma, a hyperdense metastasis could have this appearance but is not felt likely. The inferior frontal region is an unusual location for lobar hypertensive bleed.  Compared with prior scans, no abnormality was seen in this level previously.  IMPRESSION: Acute LEFT inferior frontal hemorrhage affecting the LEFT gyrus rectus, roughly 9 x 19 mm cross-section, with a small halo of surrounding edema suggesting  this bleed could be hours or even a few days old. No visible subarachnoid blood, skull fracture, or sinus air-fluid level. No similar abnormality on prior CT scans.  Chronic changes as described with generalized atrophy, small vessel disease, and numerous areas of remote infarction.  Critical Value/emergent results were called by telephone at the time of interpretation on 05/25/2014 at 9:33 pm to Lorre Nick, MD, who verbally acknowledged these results.   Electronically Signed   By: Davonna Belling M.D.   On: 05/25/2014 21:37    Assessment: 68 y.o. female presenting with encephalopathic state secondary to multiple factors, including febrile state with possible sepsis, as well as hyponatremia and acute/subacute left inferior frontal cerebral hemorrhage.  Stroke Risk Factors - hyperlipidemia and hypertension  Plan: 1. Repeat CT scan of the head without contrast and 24 hours 2. MRI, MRA  of the brain without contrast 3. PT consult, OT consult, Speech consult 4. Echocardiogram 5. Carotid dopplers 6. Prophylactic therapy-None 7. HgbA1c, fasting lipid panel 8. Telemetry monitoring and neuro ICU setting  This patient presented with encephalopathic state secondary to multiple etiologies including intracerebral hemorrhage as well in this likely sepsis and hyponatremia, requiring complex decision-making as well as management in intensive care unit setting. Total critical care time was 60 minutes.   C.R. Roseanne Reno, MD [facility]  05/25/2014, 11:39 PM

## 2014-05-25 NOTE — ED Notes (Signed)
Per Dr. Freida Busman, holding on further tx at this time, Dr. Freida Busman to attempt LP at this time.

## 2014-05-25 NOTE — Progress Notes (Signed)
ANTIBIOTIC CONSULT NOTE - INITIAL  Pharmacy Consult for:  Vancomycin and Zosyn Indication:  Sepsis  No Known Allergies  Patient Measurements: 9/3 - Weight 154 lbs  Height (not available)  Vital Signs: Temp: 100.9 F (38.3 C) (09/03 1806) Temp src: Rectal (09/03 1806) BP: 159/83 mmHg (09/03 1908) Pulse Rate: 131 (09/03 1908)   Labs:  Recent Labs  05/25/14 1832  WBC 20.1*  HGB 12.6  PLT 480*  CREATININE 0.78   The estimated CrCl is 81 ml/min/1.59m*2 using SCr 0.78.    Microbiology: No results found for this or any previous visit (from the past 720 hour(s)).  Medical History: Past Medical History  Diagnosis Date  . Encephalopathy   . Hyposmolality and/or hyponatremia   . GIB (gastrointestinal bleeding)   . Anemia   . Chronic airway obstruction   . Hypopotassemia   . Depression   . Anxiety   . Esophagitis     Medications:  Pending  Assessment:  Asked to assist with antibiotic therapy -- Vancomycin and Zosyn -- for this 68 year-old female with sepsis.  Patient was transferred from [facility] to [facility] assisted living today, where she was found to have temp 101.3, productive cough, and increased confusion.  First doses of the antibiotics were ordered in the ED.  Goals of Therapy:   Vancomycin trough level 15-20 mcg/ml  Eradication of infection  Plan:   Vancomycin 1000 mg IV x 1 dose, then 750 mg IV every 12 hours.  Zosyn 3.375 grams IV x 1 over 30 minutes, then 3.375 grams every 8 hours, each dose infused over 4 hours.  Danielle [name] R.Ph. 05/25/2014 8:00 PM

## 2014-05-25 NOTE — ED Provider Notes (Signed)
Medical screening examination/treatment/procedure(s) were conducted as a shared visit with non-physician practitioner(s) and myself.  I personally evaluated the patient during the encounter.   EKG Interpretation   Date/Time:  Thursday May 25 2014 18:13:54 EDT Ventricular Rate:  136 PR Interval:  116 QRS Duration: 89 QT Interval:  310 QTC Calculation: 466 R Axis:   -83 Text Interpretation:  Sinus tachycardia Ventricular premature complex  Abnormal R-wave progression, late transition Inferior infarct, old  Baseline wander in lead(s) II III aVF Confirmed by Cinderella Christoffersen  MD, Duward Allbritton  ([id number]) on 05/25/2014 7:18:40 PM     Patient here with altered mental status times one day. She is also hyponatremic with sodium 122. Urine and chest x-ray without evidence of infection. Does have leukocytosis. Will attempt lumbar puncture. Patient started on antibiotics.  Toy Baker, MD 05/25/14 2031

## 2014-05-25 NOTE — ED Notes (Signed)
[facility] called to provide transport to [facility]

## 2014-05-25 NOTE — ED Notes (Addendum)
EDPA aware we are unable to weigh pt at this time 2/2 no scale available and pt continues to be confused, restless and agitated. EDPA also aware first documented temperature 100.9 was obtained rectally.

## 2014-05-26 ENCOUNTER — Inpatient Hospital Stay (HOSPITAL_COMMUNITY): Payer: Medicare Other

## 2014-05-26 DIAGNOSIS — A419 Sepsis, unspecified organism: Secondary | ICD-10-CM | POA: Diagnosis present

## 2014-05-26 DIAGNOSIS — G9340 Encephalopathy, unspecified: Secondary | ICD-10-CM | POA: Diagnosis present

## 2014-05-26 DIAGNOSIS — I6350 Cerebral infarction due to unspecified occlusion or stenosis of unspecified cerebral artery: Secondary | ICD-10-CM

## 2014-05-26 DIAGNOSIS — I619 Nontraumatic intracerebral hemorrhage, unspecified: Secondary | ICD-10-CM

## 2014-05-26 DIAGNOSIS — I517 Cardiomegaly: Secondary | ICD-10-CM

## 2014-05-26 DIAGNOSIS — E871 Hypo-osmolality and hyponatremia: Secondary | ICD-10-CM

## 2014-05-26 DIAGNOSIS — R6521 Severe sepsis with septic shock: Secondary | ICD-10-CM

## 2014-05-26 LAB — RPR

## 2014-05-26 LAB — CBC
HCT: 29.9 % — ABNORMAL LOW (ref 36.0–46.0)
Hemoglobin: 10.3 g/dL — ABNORMAL LOW (ref 12.0–15.0)
MCH: 28.5 pg (ref 26.0–34.0)
MCHC: 34.4 g/dL (ref 30.0–36.0)
MCV: 82.6 fL (ref 78.0–100.0)
Platelets: 368 10*3/uL (ref 150–400)
RBC: 3.62 MIL/uL — AB (ref 3.87–5.11)
RDW: 14.7 % (ref 11.5–15.5)
WBC: 16.2 10*3/uL — AB (ref 4.0–10.5)

## 2014-05-26 LAB — BASIC METABOLIC PANEL
ANION GAP: 15 (ref 5–15)
BUN: 11 mg/dL (ref 6–23)
CHLORIDE: 90 meq/L — AB (ref 96–112)
CO2: 21 meq/L (ref 19–32)
Calcium: 8.1 mg/dL — ABNORMAL LOW (ref 8.4–10.5)
Creatinine, Ser: 0.61 mg/dL (ref 0.50–1.10)
GFR calc Af Amer: >90 mL/min (ref >90)
GFR calc non Af Amer: >90 mL/min (ref >90)
Glucose, Bld: 81 mg/dL (ref 70–99)
Potassium: 3.3 mEq/L — ABNORMAL LOW (ref 3.7–5.3)
SODIUM: 126 meq/L — AB (ref 137–147)

## 2014-05-26 LAB — HIV ANTIBODY (ROUTINE TESTING W REFLEX): HIV: NONREACTIVE

## 2014-05-26 LAB — OSMOLALITY, URINE: OSMOLALITY UR: 232 mosm/kg — AB (ref 390–1090)

## 2014-05-26 LAB — PHOSPHORUS: Phosphorus: 3.1 mg/dL (ref 2.3–4.6)

## 2014-05-26 LAB — POCT I-STAT 3, ART BLOOD GAS (G3+)
Acid-base deficit: 1 mmol/L (ref 0.0–2.0)
Bicarbonate: 22.3 mEq/L (ref 20.0–24.0)
O2 Saturation: 93 %
PCO2 ART: 32.3 mmHg — AB (ref 35.0–45.0)
TCO2: 23 mmol/L (ref 0–100)
pH, Arterial: 7.448 (ref 7.350–7.450)
pO2, Arterial: 62 mmHg — ABNORMAL LOW (ref 80.0–100.0)

## 2014-05-26 LAB — OSMOLALITY: OSMOLALITY: 249 mosm/kg — AB (ref 275–300)

## 2014-05-26 LAB — TSH: TSH: 1.09 u[IU]/mL (ref 0.350–4.500)

## 2014-05-26 LAB — PROCALCITONIN: Procalcitonin: 0.66 ng/mL

## 2014-05-26 LAB — MRSA PCR SCREENING: MRSA by PCR: NEGATIVE

## 2014-05-26 LAB — SODIUM, URINE, RANDOM: Sodium, Ur: 115 mEq/L

## 2014-05-26 LAB — MAGNESIUM: MAGNESIUM: 1.2 mg/dL — AB (ref 1.5–2.5)

## 2014-05-26 MED ORDER — SODIUM CHLORIDE 0.9 % IV SOLN
INTRAVENOUS | Status: DC
  Administered 2014-05-26 (×3): via INTRAVENOUS
  Administered 2014-05-28: 100 mL via INTRAVENOUS
  Administered 2014-05-28 – 2014-05-29 (×2): via INTRAVENOUS

## 2014-05-26 MED ORDER — VANCOMYCIN HCL IN DEXTROSE 750-5 MG/150ML-% IV SOLN
750.0000 mg | Freq: Two times a day (BID) | INTRAVENOUS | Status: DC
  Filled 2014-05-26 (×2): qty 150

## 2014-05-26 MED ORDER — CETYLPYRIDINIUM CHLORIDE 0.05 % MT LIQD
7.0000 mL | Freq: Two times a day (BID) | OROMUCOSAL | Status: DC
Start: 2014-05-26 — End: 2014-05-30
  Administered 2014-05-26 – 2014-05-29 (×6): 7 mL via OROMUCOSAL

## 2014-05-26 MED ORDER — SODIUM CHLORIDE 0.9 % IV SOLN: 250.0000 mL | INTRAVENOUS | Status: DC | PRN

## 2014-05-26 MED ORDER — ONDANSETRON HCL 4 MG/2ML IJ SOLN
4.0000 mg | Freq: Three times a day (TID) | INTRAMUSCULAR | Status: DC | PRN
Start: 2014-05-26 — End: 2014-05-30
  Administered 2014-05-26: 4 mg via INTRAVENOUS

## 2014-05-26 MED ORDER — PANTOPRAZOLE SODIUM 40 MG IV SOLR
40.0000 mg | Freq: Two times a day (BID) | INTRAVENOUS | Status: DC
  Administered 2014-05-26 – 2014-05-28 (×7): 40 mg via INTRAVENOUS
  Filled 2014-05-26 (×11): qty 40

## 2014-05-26 MED ORDER — LABETALOL HCL 5 MG/ML IV SOLN
5.0000 mg | INTRAVENOUS | Status: DC | PRN
  Administered 2014-05-26 – 2014-05-29 (×2): 5 mg via INTRAVENOUS
  Filled 2014-05-26 (×2): qty 4

## 2014-05-26 MED ORDER — MAGNESIUM SULFATE 50 % IJ SOLN
6.0000 g | Freq: Once | INTRAMUSCULAR | Status: AC
  Administered 2014-05-26: 6 g via INTRAVENOUS
  Filled 2014-05-26: qty 12

## 2014-05-26 MED ORDER — ONDANSETRON HCL 4 MG/2ML IJ SOLN
INTRAMUSCULAR | Status: AC
  Filled 2014-05-26: qty 2

## 2014-05-26 MED ORDER — POTASSIUM CHLORIDE 10 MEQ/100ML IV SOLN
10.0000 meq | INTRAVENOUS | Status: AC
  Administered 2014-05-26 (×4): 10 meq via INTRAVENOUS
  Filled 2014-05-26 (×4): qty 100

## 2014-05-26 MED ORDER — CHLORHEXIDINE GLUCONATE 0.12 % MT SOLN
15.0000 mL | Freq: Two times a day (BID) | OROMUCOSAL | Status: DC
  Administered 2014-05-26 – 2014-05-30 (×8): 15 mL via OROMUCOSAL
  Filled 2014-05-26 (×12): qty 15

## 2014-05-26 MED ORDER — HALOPERIDOL LACTATE 5 MG/ML IJ SOLN
INTRAMUSCULAR | Status: AC
  Filled 2014-05-26: qty 2

## 2014-05-26 MED ORDER — HALOPERIDOL LACTATE 5 MG/ML IJ SOLN
10.0000 mg | Freq: Once | INTRAMUSCULAR | Status: AC
  Administered 2014-05-26: 10 mg via INTRAVENOUS

## 2014-05-26 MED ORDER — ALBUTEROL SULFATE (2.5 MG/3ML) 0.083% IN NEBU
2.5000 mg | INHALATION_SOLUTION | RESPIRATORY_TRACT | Status: DC | PRN
  Administered 2014-05-27 – 2014-05-29 (×5): 2.5 mg via RESPIRATORY_TRACT
  Filled 2014-05-26 (×5): qty 3

## 2014-05-26 MED ORDER — HALOPERIDOL LACTATE 5 MG/ML IJ SOLN
5.0000 mg | Freq: Four times a day (QID) | INTRAMUSCULAR | Status: DC | PRN
  Administered 2014-05-26: 5 mg via INTRAVENOUS
  Filled 2014-05-26: qty 1

## 2014-05-26 MED ORDER — FENTANYL CITRATE 0.05 MG/ML IJ SOLN
25.0000 ug | INTRAMUSCULAR | Status: DC | PRN
  Administered 2014-05-26 (×2): 25 ug via INTRAVENOUS
  Filled 2014-05-26 (×2): qty 2

## 2014-05-26 MED ORDER — MOMETASONE FURO-FORMOTEROL FUM 100-5 MCG/ACT IN AERO
2.0000 | INHALATION_SPRAY | Freq: Two times a day (BID) | RESPIRATORY_TRACT | Status: DC
  Filled 2014-05-26: qty 8.8

## 2014-05-26 MED ORDER — PIPERACILLIN-TAZOBACTAM 3.375 G IVPB
3.3750 g | Freq: Three times a day (TID) | INTRAVENOUS | Status: DC
  Administered 2014-05-26 – 2014-05-28 (×7): 3.375 g via INTRAVENOUS
  Filled 2014-05-26 (×10): qty 50

## 2014-05-26 NOTE — Progress Notes (Signed)
PULMONARY / CRITICAL CARE MEDICINE   Name: Danielle [name] MRN: [id number] DOB: [birth year]    ADMISSION DATE:  05/25/2014 CONSULTATION DATE:  05/26/2014  REFERRING MD :  EDP  CHIEF COMPLAINT:  AMS, fever  INITIAL PRESENTATION: 68 y.o. F brought to [facility] on 9/3 from [facility] assisted [facility] with complaints of AMS and fever to 101.3.  Pt was transferred from [facility] to [facility] place earlier in the evening and apparently suffered a fall around 730 AM day of presentation.  She was cleared by a physician following fall.  In ED, pt was found to have a small ICH.  Neurology was consulted and [facility]  PCCM was consulted for admission.  STUDIES:  CT Head 9/3 >>> Acute inferior frontal hemorrhage affecting the left gyrus  rectus, roughly 9 x 19 mm, with a small halo of surrounding edema suggesting this bleed could be hours or even a few days old. No visible subarachnoid blood, skull fracture, or sinus air-fluid level. Chronic changes with generalized atrophy.  SIGNIFICANT EVENTS: 9/3 - presented to [facility] ED, transferred to [facility] ICU. 9/4- some lethargy, made alert  SUBJECTIVE: some lethargy  VITAL SIGNS: Temp:  [98.7 F (37.1 C)-102 F (38.9 C)] 98.7 F (37.1 C) (09/04 0400) Pulse Rate:  [76-133] 104 (09/04 0900) Resp:  [15-34] 15 (09/04 0900) BP: (100-159)/(46-89) 139/89 mmHg (09/04 0900) SpO2:  [94 %-100 %] 95 % (09/04 0900) Weight:  [63.9 kg (140 lb 14 oz)-70 kg (154 lb 5.2 oz)] 63.9 kg (140 lb 14 oz) (09/04 0100) HEMODYNAMICS:   VENTILATOR SETTINGS:   INTAKE / OUTPUT: Intake/Output     09/03 0701 - 09/04 0700 09/04 0701 - 09/05 0700   I.V. (mL/kg) 565 (8.8) 200 (3.1)   IV Piggyback 450    Total Intake(mL/kg) 1015 (15.9) 200 (3.1)   Urine (mL/kg/hr) 1775 350 (2)   Total Output 1775 350   Net -760 -150          PHYSICAL EXAMINATION: General: Elderly female, in NAD. Neuro: Somnolent but easily arouseable, nonfocal HEENT: PERRL, sclerae  anicteric. Cardiovascular: RRR, no M/R/G  Lungs: CTA  Abdomen: BS x 4, soft, NT/ND.  GU: extensive vaginal herpes Musculoskeletal: No gross deformities, no edema.  Skin: Intact, warm, no rashes. 4cm in diameter round erythematous macule just lateral and distal to left knee.  LABS:  CBC  Recent Labs Lab 05/25/14 1832 05/26/14 0215  WBC 20.1* 16.2*  HGB 12.6 10.3*  HCT 35.0* 29.9*  PLT 480* 368   Coag's No results found for this basename: APTT, INR,  in the last 168 hours BMET  Recent Labs Lab 05/25/14 1832 05/26/14 0215  NA 122* 126*  K 3.3* 3.3*  CL 78* 90*  CO2 25 21  BUN 11 11  CREATININE 0.78 0.61  GLUCOSE 113* 81   Electrolytes  Recent Labs Lab 05/25/14 1832 05/26/14 0215  CALCIUM 9.9 8.1*  MG  --  1.2*  PHOS  --  3.1   Sepsis Markers  Recent Labs Lab 05/25/14 1906 05/26/14 0215  LATICACIDVEN 2.12  --   PROCALCITON  --  0.66   ABG No results found for this basename: PHART, PCO2ART, PO2ART,  in the last 168 hours Liver Enzymes  Recent Labs Lab 05/25/14 1832  AST 36  ALT 20  ALKPHOS 87  BILITOT 1.0  ALBUMIN 3.4*   Cardiac Enzymes No results found for this basename: TROPONINI, PROBNP,  in the last 168 hours Glucose No results found for this  basename: GLUCAP,  in the last 168 hours  Imaging Dg Chest 1 View  05/25/2014   CLINICAL DATA:  Fever, altered mental status  EXAM: CHEST - 1 VIEW  COMPARISON:  Chest radiograph 12/28/2013  FINDINGS: Multiple leads overlie the patient. Stable cardiac and mediastinal contours. No consolidative pulmonary opacities. Stable calcified granuloma left mid lung. No pleural effusion pneumothorax.  IMPRESSION: No acute cardiopulmonary process.   Electronically Signed   By: Annia Belt M.D.   On: 05/25/2014 20:02   Ct Head Wo Contrast  05/25/2014   CLINICAL DATA:  Fever, altered mental status, combative.  EXAM: CT HEAD WITHOUT CONTRAST  TECHNIQUE: Contiguous axial images were obtained from the base of the skull  through the vertex without contrast.  COMPARISON:  CT head 12/28/2013.  CT head 07/01/2010.  FINDINGS: Generalized atrophy. Chronic microvascular ischemic change. Remote areas of lacunar cerebral infarction are redemonstrated similar in distribution to prior MR.  There is an acute inferior frontal hemorrhage affecting the LEFT gyrus rectus. This measures roughly 9 x 19 mm cross-section as a small halo of surrounding edema. No features to suggest vasogenic edema. No subfrontal masses are evident. There is no visible adjacent subarachnoid blood. There is advanced vascular calcification in the LEFT carotid siphon but no suggestion of a berry aneurysm. I do not see a skull fracture or significant scalp hematoma. There is no sinus air-fluid level to suggest hemorrhage or sinus opacities to suggest inflammation. Other than trauma, with a parenchymal hematoma, a hyperdense metastasis could have this appearance but is not felt likely. The inferior frontal region is an unusual location for lobar hypertensive bleed.  Compared with prior scans, no abnormality was seen in this level previously.  IMPRESSION: Acute LEFT inferior frontal hemorrhage affecting the LEFT gyrus rectus, roughly 9 x 19 mm cross-section, with a small halo of surrounding edema suggesting this bleed could be hours or even a few days old. No visible subarachnoid blood, skull fracture, or sinus air-fluid level. No similar abnormality on prior CT scans.  Chronic changes as described with generalized atrophy, small vessel disease, and numerous areas of remote infarction.  Critical Value/emergent results were called by telephone at the time of interpretation on 05/25/2014 at 9:33 pm to Lorre Nick, MD, who verbally acknowledged these results.   Electronically Signed   By: Davonna Belling M.D.   On: 05/25/2014 21:37    ASSESSMENT / PLAN:  PULMONARY A: Protecting airway At risk respiratory failure if ICH / edema increases ? COPD P:   abg with some lethargy  to ensure co2  / ph Continue outpatient Advair Elwin Sleight per hospital formulary).  CARDIOVASCULAR A:  BP controlled P:  Given ICH, Labetalol PRN to maintain SBP < 160. Hold outpatient atorvastatin, plavix, lopressor until swallow safe  RENAL A:   Hx hypoosmolar hyponatremia (serum osm 249,, volume status euvolemic or hypo) Hypokalemia hypomagnesemia P:   NS @ 100 would continue this, appearing like hypovolemic, send urine na Potassium x 4 runs BMP in AM.  GASTROINTESTINAL A:   Nutrition Hx Esophagitis P:   NPO, slp needed Pantoprazole.  HEMATOLOGIC A:   VTE Prophylaxis P:  SCD's only. CBC in AM.  INFECTIOUS A:   Possible sepsis - though not impressed at this point.  Fever and leukocytosis could represent acute phase reactants in setting of ICH.  P:   BCx2 9/3 >>> UCx 9/3 >>> Abx: Vanc, start date 9/3 >>>9/4 Abx: Zosyn, start date 9/3, day 1/x. PCT algorithm to limit abx  exposure with low threshold to stop. 0.66, repeat in am then narrow or dc if remains low and clinically proegrssing  ENDOCRINE A:   R/o hypothyroidism (as secondary cause of hyponatremia) P:   Check TSH - 1 , wnl cbg in am   NEUROLOGIC A:   ICH - unclear whether secondary to trauma from fall vs CVA Acute encephalopathy Hx Depression  Hx Anxiety P:   Per neuro recs. Hold outpatient remeron, zoloft. Consider repeat CT head HOB elevated   TODAY'S SUMMARY:  Ct repeat if stable to tele, abg, supp, k, mag  Mathius Birkeland J. Tyson Alias, MD, FACP Pgr: [phone] [facility]

## 2014-05-26 NOTE — Progress Notes (Signed)
VASCULAR LAB PRELIMINARY  PRELIMINARY  PRELIMINARY  PRELIMINARY  Carotid duplex  completed.    Preliminary report:  Bilateral:  1-39% ICA stenosis.  Vertebral artery flow is antegrade.    Limited by patient cooperation.   Nieves Barberi, RVT 05/26/2014, 12:58 PM

## 2014-05-26 NOTE — Evaluation (Signed)
Clinical/Bedside Swallow Evaluation Patient Details  Name: Danielle [name] MRN: [id number] Date of Birth: Feb 02, 1946  Today's Date: 05/26/2014 Time: 1350-1420 SLP Time Calculation (min): 30 min  Past Medical History:  Past Medical History  Diagnosis Date  . Encephalopathy   . Hyposmolality and/or hyponatremia   . GIB (gastrointestinal bleeding)   . Anemia   . Chronic airway obstruction   . Hypopotassemia   . Depression   . Anxiety   . Esophagitis    Past Surgical History: History reviewed. No pertinent past surgical history. HPI:  Danielle [name] brought to [facility] on 9/3 from [facility] with complaints of AMS and fever to 101.3.  Pt was transferred from [facility] to [facility] earlier in the evening and apparently suffered a fall around 730 AM day of presentation.  She was cleared by a physician following fall.  In ED, pt was found to have a small ICH.  Neurology was consulted and [facility].  PCCM was consulted for admission. MRI head: "Acute LEFT inferior frontal hemorrhage affecting the LEFT gyrus rectus, roughly 9 x 19 mm cross-section, with a small halo of surrounding edema suggesting this bleed could be hours or even a few days old. No visible subarachnoid blood, skull fracture, or sinus air-fluid level. No similar abnormality on prior CT scans. Chronic changes as described with generalized atrophy, small vessel disease, and numerous areas of remote infarction" CXR:" No acute cardiopulmonary processes" PMH: encephalopathy, GIB, Anemia, depression, anxiety, esophagitis, chronic airway obstruction.   Assessment / Plan / Recommendation Clinical Impression   Patient presents with a moderate oropharyngeal swallow characterized by decreased oral manipulation and transit of puree bolus, decreased swallow initiation of puree solids and thin liquids. Current swallow function is significantly impacted by patient's current respiratory and cognitive status.  She exhibited a congested cough prior to PO's and exhibited immediate throat clearing and delayed coughing during this bedside swallow eval. Difficult to distinguish, but suspect that cough response is secondary to decreased respiratory status. She exhibited increased WOB, with respiratory rate increasing from 22 to 40.  Daughter stated that patient has had cough for a long time. Recommend that patient be initiated on thin liquids, with puree solids for intake of medication only. Secondary to her current respiratory status, cognitive status, and energy level (fatigued quickly during evaluation and fell asleep after eval), patient is not currently ready to start PO's for meals/nutritional intake. SLP will continue to follow patient and determine readiness to initiate PO's for meals.    Aspiration Risk  Moderate    Diet Recommendation Thin liquid (rec: thin liquids, and puree solids with meds crushed. Not ready for meal secondary to respiratory and cognitive status at this time.)   Liquid Administration via: Cup;No straw Medication Administration: Crushed with puree Supervision: Full supervision/cueing for compensatory strategies Compensations: Slow rate;Small sips/bites    Other  Recommendations Oral Care Recommendations: Oral care BID   Follow Up Recommendations       Frequency and Duration min 2x/week  2 weeks   Pertinent Vitals/Pain     SLP Swallow Goals  1. Patient will tolerate trials of PO's for determination of readiness to initiate PO's for meals.    Swallow Study Prior Functional Status       General Date of Onset: 05/25/14 HPI: Danielle [name] brought to [facility] on 9/3 from [facility] assisted living with complaints of AMS and fever to 101.3.  Pt was transferred from [facility] to [place] [facility] earlier  in the evening and apparently suffered a fall around 730 AM day of presentation.  She was cleared by a physician following fall.  In ED, pt was found to have a small ICH.   Neurology was consulted and [facility] to [facility].  PCCM was consulted for admission. Type of Study: Bedside swallow evaluation Previous Swallow Assessment: N/A Diet Prior to this Study: Regular;Thin liquids Temperature Spikes Noted: No Respiratory Status: Room air History of Recent Intubation: No Behavior/Cognition: Confused;Requires cueing;Decreased sustained attention;Impulsive Oral Cavity - Dentition: Adequate natural dentition Self-Feeding Abilities: Needs assist;Total assist Patient Positioning: Upright in bed Baseline Vocal Quality: Low vocal intensity Volitional Cough: Weak    Oral/Motor/Sensory Function Overall Oral Motor/Sensory Function: Appears within functional limits for tasks assessed   Ice Chips Ice chips: Not tested   Thin Liquid Thin Liquid: Impaired Presentation: Cup Pharyngeal  Phase Impairments: Suspected delayed Swallow;Cough - Delayed;Throat Clearing - Immediate Other Comments: Patient with [name], and per daughter, the coughing and congestion is normal for her.    Nectar Thick Nectar Thick Liquid: Not tested   Honey Thick Honey Thick Liquid: Not tested   Puree Puree: Impaired Presentation: Spoon Oral Phase Impairments: Reduced lingual movement/coordination Oral Phase Functional Implications: Prolonged oral transit Other Comments: No overt s/s aspiration with puree solids.    Solid   GO    Solid: Not tested       Danielle [name] 05/26/2014,3:51 PM  Angela Nevin, MA, CCC-SLP [facility] Speech-Language Pathologist

## 2014-05-26 NOTE — Progress Notes (Addendum)
STROKE TEAM PROGRESS NOTE   HISTORY Chief Complaint: Encephalopathic state with acute intracerebral hemorrhage.  HPI: Danielle [name] is an 68 y.o. female history of gastrointestinal bleeding, anemia, depression and anxiety, as well as hyperlipidemia, who was brought to the emergency room from assisted living facility with altered mental status. It's unclear when patient was last seen well. CT scan of her head showed a 9 X 19 mm left inferior frontal hemorrhage with surrounding edema. No subarachnoid blood is seen. Patient also is hyponatremic with sodium of 122. She was febrile and WBC count was 20.1K. Urinalysis was unremarkable. Serum ammonia was normal. Blood cultures are pending. Patient was started on Zosyn and vancomycin for antibiotic coverage.  LSN: Unclear  tPA Given: No: Acute/subacute ICH  mRankin: 4  SUBJECTIVE (INTERVAL HISTORY) No family is at the bedside.  Overall she feels her condition is stable. She was very sleepy, open eyes on voice and then closed. Not cooperative on any question or commands. Do not want to be bothered.   As per nurse, she was sent to SNF after a stroke in April. It seems she was gradually improving and was planned to go to ALF. Yesterday she had a fall and observed in SNF was considered OK and then he was transport to ALF. However, in ALF, she was found to be confused and EMS was called. CT showed mesial frontal small bleeding. Also found to have hyponatremia at 122 and WBC high at 20s. She was admitted to ICU for further work up. She is current smoker.   OBJECTIVE Temp:  [97.9 F (36.6 C)-102 F (38.9 C)] 98.4 F (36.9 C) (09/04 1958) Pulse Rate:  [76-133] 96 (09/04 1958) Cardiac Rhythm:  [-] Sinus tachycardia (09/04 2039) Resp:  [15-34] 18 (09/04 1958) BP: (100-169)/(46-139) 116/56 mmHg (09/04 1958) SpO2:  [93 %-100 %] 98 % (09/04 1958) Weight:  [136 lb 14.5 oz (62.1 kg)-140 lb 14 oz (63.9 kg)] 136 lb 14.5 oz (62.1 kg) (09/04 1958)  No results  found for this basename: GLUCAP,  in the last 168 hours  Recent Labs Lab 05/25/14 1832 05/26/14 0215  NA 122* 126*  K 3.3* 3.3*  CL 78* 90*  CO2 25 21  GLUCOSE 113* 81  BUN 11 11  CREATININE 0.78 0.61  CALCIUM 9.9 8.1*  MG  --  1.2*  PHOS  --  3.1    Recent Labs Lab 05/25/14 1832  AST 36  ALT 20  ALKPHOS 87  BILITOT 1.0  PROT 7.5  ALBUMIN 3.4*    Recent Labs Lab 05/25/14 1832 05/26/14 0215  WBC 20.1* 16.2*  NEUTROABS 18.4*  --   HGB 12.6 10.3*  HCT 35.0* 29.9*  MCV 81.6 82.6  PLT 480* 368   No results found for this basename: CKTOTAL, CKMB, CKMBINDEX, TROPONINI,  in the last 168 hours No results found for this basename: LABPROT, INR,  in the last 72 hours  Recent Labs  05/25/14 1855  COLORURINE AMBER*  LABSPEC 1.018  PHURINE 7.5  GLUCOSEU NEGATIVE  HGBUR NEGATIVE  BILIRUBINUR NEGATIVE  KETONESUR NEGATIVE  PROTEINUR 30*  UROBILINOGEN 0.2  NITRITE NEGATIVE  LEUKOCYTESUR NEGATIVE    No results found for this basename: chol, trig, hdl, cholhdl, vldl, ldlcalc   No results found for this basename: HGBA1C   No results found for this basename: labopia, cocainscrnur, labbenz, amphetmu, thcu, labbarb    No results found for this basename: ETH,  in the last 168 hours  Dg Chest 1 View  05/25/2014    IMPRESSION: No acute cardiopulmonary process.     Ct Head Wo Contrast  05/26/2014   IMPRESSION: 19 x 9 mm hematoma in the left gyrus rectus is size stable. Surrounding edema has mildly increased.      Ct Head Wo Contrast  05/25/2014   IMPRESSION: Acute LEFT inferior frontal hemorrhage affecting the LEFT gyrus rectus, roughly 9 x 19 mm cross-section, with a small halo of surrounding edema suggesting this bleed could be hours or even a few days old. No visible subarachnoid blood, skull fracture, or sinus air-fluid level. No similar abnormality on prior CT scans.  Chronic changes as described with generalized atrophy, small vessel disease, and numerous areas of  remote infarction.    Previous MRI 12/2013 IMPRESSION: 1. Acute nonhemorrhagic infarcts involving the white matter adjacent to the atrium of the left lateral ventricle. 2. Remote lacunar infarcts are present in the basal ganglia bilaterally. 3. Moderate atrophy and white matter disease likely reflects the sequela of chronic microvascular ischemia in the setting of other ischemic disease. 4. Left mastoid effusion. No obstructing nasopharyngeal lesion is evident.  Previous MRI 06/2010 IMPRESSION: Recent, subacute infarct extending from the posterior left basal ganglia to the deep white matter of the left centrum semiovale.  2D ehco -  - Left ventricle: The cavity size was normal. There was moderate concentric hypertrophy. Systolic function was vigorous. The estimated ejection fraction was in the range of 65% to 70%. Wall motion was normal; there were no regional wall motion abnormalities.   CUS- Bilateral: 1-39% ICA stenosis. Vertebral artery flow is antegrade.   PHYSICAL EXAM  Temp:  [97.9 F (36.6 C)-102 F (38.9 C)] 98.4 F (36.9 C) (09/04 1958) Pulse Rate:  [76-133] 96 (09/04 1958) Resp:  [15-34] 18 (09/04 1958) BP: (100-169)/(46-139) 116/56 mmHg (09/04 1958) SpO2:  [93 %-100 %] 98 % (09/04 1958) Weight:  [136 lb 14.5 oz (62.1 kg)-140 lb 14 oz (63.9 kg)] 136 lb 14.5 oz (62.1 kg) (09/04 1958)  General - thin, well developed, drowsy and sleepy, do not want to be bothered.  Ophthalmologic - not able to exam.  Cardiovascular - Regular rate and rhythm with no murmur.  Neck - supple  Neuro - pt sleepy drowsy but arousable. Open eyes on voice but close off right away without further stimulation. Do not want to answer questions, but able to tell her name and daughters name but no further answers. Keep in fetal position and do not want to move. PERRL, spontaneous movement of eyes, facial symmetrical, tongue in middle inside mouth, move all extremities spontaneously, do not  cooperate on sensory and reflex and coordination exams. Gait not able to test.   ASSESSMENT/PLAN  Ms. Danielle [name] is a 68 y.o. 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 CT showed left mesial frontal lobe small bleeding. Not sure if pt has acute change of mental status or pt at her mental baseline as the reports paradoxical. No family available for history. Pt bleeding is atypical in location for hypertensive bleed or CAA bleed. As per report, she had a fall on the day of admission, wondering if this is traumatic bleeding but no external lesion at skull. Repeat CT showed stable bleeding. Pt can not keep still for MRI.   Left mesial frontal small ICH - etiology not clear  1. Less likely to be HTN bleeding as no clear hx of HTN and  BP stable during admission and not typical location for HTN ICH 2. Could be CAA bleed, but a little young age, and also not typical location. However, it seems like pt has cognitive impairment at baseline. 3. Could be traumatic ICH, as pt reportedly had a fall, but no external lesion at the skull. 4. Hemorrhagic transfusion less likely from appearance but pt does have extensive stroke history as shown in previous MRIs. 5. Hemorrhage due to tumor, AVM, CVT needs further work up and ruled out.      clopidogrel 75 mg orally every day prior to admission, now on no antiplatelet or anticoagulation  MRI and MRA not able to perform due to not lying still  Carotid Doppler  negative  2D Echo  unremarkable  Stroke lab pending  SCDs for VTE prophylaxis  NPO    Bedrest  Repeat CT head showed stable hematoma  Transfer to floor  Leukocytosis - trending down from 20.1 to 16.2 - on zosyn  - CCM on board - cultures NGTD - afebrile  - neck supple and low suspicious for meningitis and encephalitis at this time. If pt continued to have AMS, or developed fever,  headache, stiff neck, please consider LP to evaluate.   Other Stroke Risk Factors Advanced age Cigarette smoker, advised to stop smoking Consider nicotine patch Hx of recurrent CVAs with WM ischemic changes Stroke risk factor modification needed  Other active issue - hyponatremia, CCM on board, on NS  Other Pertinent History  ? Dementia  ? Hx of migraine  May consider Notch gene testing to rule out CADASIL   Hospital day # 1  Marvel Plan, MD PhD Stroke Neurology 05/26/2014 10:02 PM     To contact Stroke Continuity provider, please refer to [email or web address]. After hours, contact General Neurology

## 2014-05-26 NOTE — Progress Notes (Signed)
Pt very anxious and wants to go outside to smoke.  Daughter said pt used a nicotine patch during her last hospitalization.

## 2014-05-26 NOTE — Progress Notes (Signed)
Echocardiogram 2D Echocardiogram has been performed.  Alexiz Sustaita 05/26/2014, 11:06 AM

## 2014-05-26 NOTE — Progress Notes (Signed)
UR completed.  Steph Cheadle, RN BSN MHA CCM Trauma/Neuro ICU Case Manager [phone]  

## 2014-05-26 NOTE — Progress Notes (Addendum)
SLP cleared pt to take meds crushed in puree and thin liquids under full supervision.  The SLP did not compete the yellow sheet with these instructions to hang above the head of bed.  A diet order has not been placed.

## 2014-05-27 ENCOUNTER — Inpatient Hospital Stay (HOSPITAL_COMMUNITY): Payer: Medicare Other

## 2014-05-27 LAB — CBC WITH DIFFERENTIAL/PLATELET
BASOS ABS: 0 10*3/uL (ref 0.0–0.1)
Basophils Relative: 0 % (ref 0–1)
EOS ABS: 0 10*3/uL (ref 0.0–0.7)
Eosinophils Relative: 0 % (ref 0–5)
HCT: 28.8 % — ABNORMAL LOW (ref 36.0–46.0)
Hemoglobin: 9.8 g/dL — ABNORMAL LOW (ref 12.0–15.0)
Lymphocytes Relative: 8 % — ABNORMAL LOW (ref 12–46)
Lymphs Abs: 1.1 10*3/uL (ref 0.7–4.0)
MCH: 29.3 pg (ref 26.0–34.0)
MCHC: 34 g/dL (ref 30.0–36.0)
MCV: 86.2 fL (ref 78.0–100.0)
Monocytes Absolute: 0.7 10*3/uL (ref 0.1–1.0)
Monocytes Relative: 5 % (ref 3–12)
NEUTROS PCT: 87 % — AB (ref 43–77)
Neutro Abs: 11.1 10*3/uL — ABNORMAL HIGH (ref 1.7–7.7)
PLATELETS: 359 10*3/uL (ref 150–400)
RBC: 3.34 MIL/uL — ABNORMAL LOW (ref 3.87–5.11)
RDW: 15.3 % (ref 11.5–15.5)
WBC: 12.9 10*3/uL — AB (ref 4.0–10.5)

## 2014-05-27 LAB — COMPREHENSIVE METABOLIC PANEL
ALT: 22 U/L (ref 0–35)
AST: 33 U/L (ref 0–37)
Albumin: 2.3 g/dL — ABNORMAL LOW (ref 3.5–5.2)
Alkaline Phosphatase: 74 U/L (ref 39–117)
Anion gap: 15 (ref 5–15)
BILIRUBIN TOTAL: 0.6 mg/dL (ref 0.3–1.2)
BUN: 7 mg/dL (ref 6–23)
CALCIUM: 7.9 mg/dL — AB (ref 8.4–10.5)
CHLORIDE: 101 meq/L (ref 96–112)
CO2: 20 meq/L (ref 19–32)
Creatinine, Ser: 0.45 mg/dL — ABNORMAL LOW (ref 0.50–1.10)
GLUCOSE: 64 mg/dL — AB (ref 70–99)
Potassium: 2.8 mEq/L — CL (ref 3.7–5.3)
Sodium: 136 mEq/L — ABNORMAL LOW (ref 137–147)
Total Protein: 5.7 g/dL — ABNORMAL LOW (ref 6.0–8.3)

## 2014-05-27 LAB — LIPID PANEL
CHOLESTEROL: 104 mg/dL (ref 0–200)
HDL: 19 mg/dL — AB (ref >39)
LDL CALC: 67 mg/dL (ref 0–99)
TRIGLYCERIDES: 88 mg/dL (ref <150)
Total CHOL/HDL Ratio: 5.5 RATIO
VLDL: 18 mg/dL (ref 0–40)

## 2014-05-27 LAB — HEMOGLOBIN A1C
Hgb A1c MFr Bld: 5.6 % (ref <5.7)
Mean Plasma Glucose: 114 mg/dL (ref <117)

## 2014-05-27 LAB — URINE CULTURE
Colony Count: NO GROWTH
Culture: NO GROWTH

## 2014-05-27 LAB — PROCALCITONIN: Procalcitonin: 0.25 ng/mL

## 2014-05-27 LAB — VITAMIN B12: VITAMIN B 12: 438 pg/mL (ref 211–911)

## 2014-05-27 MED ORDER — LORAZEPAM 2 MG/ML IJ SOLN
1.0000 mg | Freq: Once | INTRAMUSCULAR | Status: AC
  Administered 2014-05-27: 1 mg via INTRAVENOUS
  Filled 2014-05-27: qty 1

## 2014-05-27 MED ORDER — POTASSIUM CHLORIDE 10 MEQ/100ML IV SOLN
10.0000 meq | INTRAVENOUS | Status: AC
  Administered 2014-05-27 (×4): 10 meq via INTRAVENOUS
  Filled 2014-05-27 (×4): qty 100

## 2014-05-27 MED ORDER — MIDAZOLAM HCL 2 MG/2ML IJ SOLN: 0.5000 mg | INTRAMUSCULAR | Status: DC | PRN

## 2014-05-27 MED ORDER — ALPRAZOLAM 0.25 MG PO TABS
0.2500 mg | ORAL_TABLET | Freq: Once | ORAL | Status: AC
  Administered 2014-05-27: 0.25 mg via ORAL
  Filled 2014-05-27: qty 1

## 2014-05-27 MED ORDER — POTASSIUM CHLORIDE CRYS ER 20 MEQ PO TBCR
40.0000 meq | EXTENDED_RELEASE_TABLET | Freq: Three times a day (TID) | ORAL | Status: AC
  Administered 2014-05-27 (×3): 40 meq via ORAL
  Filled 2014-05-27 (×3): qty 2

## 2014-05-27 MED ORDER — IOHEXOL 350 MG/ML SOLN
50.0000 mL | Freq: Once | INTRAVENOUS | Status: AC | PRN
  Administered 2014-05-27: 50 mL via INTRAVENOUS

## 2014-05-27 NOTE — Progress Notes (Signed)
eLink Physician-Brief Progress Note Patient Name: Danielle [name] DOB: April 01, 1946 MRN: [id number]   Date of Service  05/27/2014  HPI/Events of Note  Low k   eICU Interventions  k iv     Intervention Category Intermediate Interventions: Electrolyte abnormality - evaluation and management  Jayanth Szczesniak J. 05/27/2014, 5:13 AM

## 2014-05-27 NOTE — Progress Notes (Signed)
[facility] PROGRESS NOTE Interim History: 68 y.o. F brought to [facility] on 9/3 from [facility] assisted living with complaints of AMS and fever to 101.3. Pt was transferred from [facility] to [facility] earlier in the evening and apparently suffered a fall around 730 AM day of presentation. She was cleared by a physician following fall. In ED, pt was found to have a small ICH. Neurology was consulted and requested transfer to [facility]. PCCM was consulted for admission.  Assessment/Plan: ICH (intracerebral hemorrhage)/Acute encephalopathy - neurology on board. - no obvious infectious source. - Dys 1 diet thin liq.  Hyponatremia/Hypokalemia: - hypo osmolar/hyponatremia. - Improving with IV fluids. - replete K. Recheck a b-met most likely due to decrease intravascular vol.  Most likely SIRS: - Fever leukocytosis and procalcitonin acting like acute phase reactant. - BCx2 9/3 >>>  - UCx 9/3 >>>  - Abx: Vanc and zosyn,  9/3 >>>9/4  - No source of infection, d/c antibiotics.   Code Status: full Family Communication: none Disposition Plan:  inpatinet    Consultants:  Neurology  Procedures: CT Head 9/3 >>> Acute inferior frontal hemorrhage affecting the left gyrus rectus, roughly 9 x 19 mm, with a small halo of surrounding edema suggesting this bleed could be hours or even a few days old. No visible subarachnoid blood, skull fracture, or sinus air-fluid level. Chronic changes with generalized atrophy CT head 9.4: 19 x 9 mm hematoma in the left gyrus rectus is size stable.  Surrounding edema has mildly increased.  Antibiotics:  As above.  HPI/Subjective: complaining of mitten being on.  Objective: Filed Vitals:   05/26/14 1900 05/26/14 1911 05/26/14 1958 05/27/14 0530  BP: 169/134  116/56 129/71  Pulse: 118  96 103  Temp:  97.9 F (36.6 C) 98.4 F (36.9 C) 98.2 F (36.8 C)  TempSrc:  Oral Oral Oral  Resp: _0 Height:   _1  (1.575 m)   Weight:    62.1 kg (136 lb 14.5 oz) 62.3 kg (137 lb 5.6 oz)  SpO2: 98%  98% 98%    Intake/Output Summary (Last 24 hours) at 05/27/14 0918 Last data filed at 05/27/14 0488  Gross per 24 hour  Intake 2477.08 ml  Output    955 ml  Net 1522.08 ml   Filed Weights   05/26/14 0100 05/26/14 1958 05/27/14 0530  Weight: 63.9 kg (140 lb 14 oz) 62.1 kg (136 lb 14.5 oz) 62.3 kg (137 lb 5.6 oz)    Exam:  General: Alert, awake, oriented x1, in no acute distress.  HEENT: No bruits, no goiter.  Heart: Regular rate and rhythm. Lungs: Good air movement, clear Abdomen: Soft, nontender, nondistended, positive bowel sounds.    Data Reviewed: Basic Metabolic Panel:  Recent Labs Lab 05/25/14 1832 05/26/14 0215 05/27/14 0320  NA 122* 126* 136*  K 3.3* 3.3* 2.8*  CL 78* 90* 101  CO2 _2 GLUCOSE 113* 81 64*  BUN _3 CREATININE 0.78 0.61 0.45*  CALCIUM 9.9 8.1* 7.9*  MG  --  1.2*  --   PHOS  --  3.1  --    Liver Function Tests:  Recent Labs Lab 05/25/14 1832 05/27/14 0320  AST 36 33  ALT 20 22  ALKPHOS 87 74  BILITOT 1.0 0.6  PROT 7.5 5.7*  ALBUMIN 3.4* 2.3*   No results found for this basename: LIPASE, AMYLASE,  in the last 168 hours  Recent Labs Lab 05/25/14 2050  AMMONIA 22   CBC:  Recent Labs Lab 05/25/14 1832 05/26/14 0215 05/27/14 0320  WBC 20.1* 16.2* 12.9*  NEUTROABS 18.4*  --  11.1*  HGB 12.6 10.3* 9.8*  HCT 35.0* 29.9* 28.8*  MCV 81.6 82.6 86.2  PLT 480* 368 359   Cardiac Enzymes: No results found for this basename: CKTOTAL, CKMB, CKMBINDEX, TROPONINI,  in the last 168 hours BNP (last 3 results) No results found for this basename: PROBNP,  in the last 8760 hours CBG: No results found for this basename: GLUCAP,  in the last 168 hours  Recent Results (from the past 240 hour(s))  CULTURE, BLOOD (ROUTINE X 2)     Status: None   Collection Time    05/25/14  6:32 PM      Result Value Ref Range Status   Specimen Description BLOOD LEFT ANTECUBITAL    Final   Special Requests BOTTLES DRAWN AEROBIC AND ANAEROBIC 5ML   Final   Culture  Setup Time     Final   Value: 05/25/2014 23:05     Performed at [facility]   Culture     Final   Value:        BLOOD CULTURE RECEIVED NO GROWTH TO DATE CULTURE WILL BE HELD FOR 5 DAYS BEFORE ISSUING A FINAL NEGATIVE REPORT     Performed at [facility]   Report Status PENDING   Incomplete  MRSA PCR SCREENING     Status: None   Collection Time    05/26/14 12:58 AM      Result Value Ref Range Status   MRSA by PCR NEGATIVE  NEGATIVE Final   Comment:            The GeneXpert MRSA Assay (FDA     approved for NASAL specimens     only), is one component of a     comprehensive MRSA colonization     surveillance program. It is not     intended to diagnose MRSA     infection nor to guide or     monitor treatment for     MRSA infections.     Studies: Dg Chest 1 View  05/25/2014   CLINICAL DATA:  Fever, altered mental status  EXAM: CHEST - 1 VIEW  COMPARISON:  Chest radiograph 12/28/2013  FINDINGS: Multiple leads overlie the patient. Stable cardiac and mediastinal contours. No consolidative pulmonary opacities. Stable calcified granuloma left mid lung. No pleural effusion pneumothorax.  IMPRESSION: No acute cardiopulmonary process.   Electronically Signed   By: Lovey Newcomer M.D.   On: 05/25/2014 20:02   Ct Head Wo Contrast  05/26/2014   CLINICAL DATA:  Rule out hematoma expansion  EXAM: CT HEAD WITHOUT CONTRAST  TECHNIQUE: Contiguous axial images were obtained from the base of the skull through the vertex without intravenous contrast.  COMPARISON:  05/25/2014  FINDINGS: Even when accounting for repeat imaging, there is motion limitation, resulting in artifact at the mid portion of the scan.  The inferior, parasagittal left frontal lobe parenchymal hemorrhage is size stable at 19 mm in length by 9 mm in width. Surrounding edema has mildly increased, with local mass effect. No interval subarachnoid  extension. No new hemorrhage.  Focal mild thickening at the level of the mid falx appears stable from previous, arguing against trace subdural hemorrhage.  Remote lacunar infarcts in the right thalamus, right putamen, and periatrial white matter on the left. No evidence of shift, herniation, or acute infarct.  IMPRESSION: 19  x 9 mm hematoma in the left gyrus rectus is size stable. Surrounding edema has mildly increased.   Electronically Signed   By: Jorje Guild M.D.   On: 05/26/2014 12:40   Ct Head Wo Contrast  05/25/2014   CLINICAL DATA:  Fever, altered mental status, combative.  EXAM: CT HEAD WITHOUT CONTRAST  TECHNIQUE: Contiguous axial images were obtained from the base of the skull through the vertex without contrast.  COMPARISON:  CT head 12/28/2013.  CT head 07/01/2010.  FINDINGS: Generalized atrophy. Chronic microvascular ischemic change. Remote areas of lacunar cerebral infarction are redemonstrated similar in distribution to prior MR.  There is an acute inferior frontal hemorrhage affecting the LEFT gyrus rectus. This measures roughly 9 x 19 mm cross-section as a small halo of surrounding edema. No features to suggest vasogenic edema. No subfrontal masses are evident. There is no visible adjacent subarachnoid blood. There is advanced vascular calcification in the LEFT carotid siphon but no suggestion of a berry aneurysm. I do not see a skull fracture or significant scalp hematoma. There is no sinus air-fluid level to suggest hemorrhage or sinus opacities to suggest inflammation. Other than trauma, with a parenchymal hematoma, a hyperdense metastasis could have this appearance but is not felt likely. The inferior frontal region is an unusual location for lobar hypertensive bleed.  Compared with prior scans, no abnormality was seen in this level previously.  IMPRESSION: Acute LEFT inferior frontal hemorrhage affecting the LEFT gyrus rectus, roughly 9 x 19 mm cross-section, with a small halo of  surrounding edema suggesting this bleed could be hours or even a few days old. No visible subarachnoid blood, skull fracture, or sinus air-fluid level. No similar abnormality on prior CT scans.  Chronic changes as described with generalized atrophy, small vessel disease, and numerous areas of remote infarction.  Critical Value/emergent results were called by telephone at the time of interpretation on 05/25/2014 at 9:33 pm to Lacretia Leigh, MD, who verbally acknowledged these results.   Electronically Signed   By: Rolla Flatten M.D.   On: 05/25/2014 21:37   Dg Chest Port 1 View  05/27/2014   CLINICAL DATA:  Assess edema  EXAM: PORTABLE CHEST - 1 VIEW  COMPARISON:  05/25/2014  FINDINGS: Normal heart size.  Clear lungs.  No pneumothorax.  IMPRESSION: No active disease.   Electronically Signed   By: Maryclare Bean M.D.   On: 05/27/2014 07:13    Scheduled Meds: . antiseptic oral rinse  7 mL Mouth Rinse q12n4p  . chlorhexidine  15 mL Mouth Rinse BID  . pantoprazole (PROTONIX) IV  40 mg Intravenous Q12H  . piperacillin-tazobactam (ZOSYN)  IV  3.375 g Intravenous Q8H  . potassium chloride  10 mEq Intravenous Q1 Hr x 4   Continuous Infusions: . sodium chloride 100 mL/hr at 05/26/14 Bynum, ABRAHAM  [facility] Pager [phone]. If 8PM-8AM, please contact night-coverage at [email or web address], password [facility] 05/27/2014, 9:18 AM  LOS: 2 days      **Disclaimer: This note may have been dictated with voice recognition software. Similar sounding words can inadvertently be transcribed and this note may contain transcription errors which may not have been corrected upon publication of note.**

## 2014-05-27 NOTE — Progress Notes (Signed)
eLink Physician-Brief Progress Note Patient Name: Danielle [name] DOB: 1946/03/31 MRN: [id number]   Date of Service  05/27/2014  HPI/Events of Note  aggitation  eICU Interventions  Avoid haldol with ich and risk seziure threshold Add low dose versed prn     Intervention Category Minor Interventions: Routine modifications to care plan (e.g. PRN medications for pain, fever);Agitation / anxiety - evaluation and management  FEINSTEIN,DANIEL J. 05/27/2014, 2:00 AM

## 2014-05-27 NOTE — Progress Notes (Signed)
STROKE TEAM PROGRESS NOTE   HISTORY Chief Complaint: Encephalopathic state with acute intracerebral hemorrhage.  HPI: Danielle [name] is an 68 y.o. female history of gastrointestinal bleeding, anemia, depression and anxiety, as well as hyperlipidemia, who was brought to the emergency room from assisted living facility with altered mental status. It's unclear when patient was last seen well. CT scan of her head showed a 9 X 19 mm left inferior frontal hemorrhage with surrounding edema. No subarachnoid blood is seen. Patient also is hyponatremic with sodium of 122. She was febrile and WBC count was 20.1K. Urinalysis was unremarkable. Serum ammonia was normal. Blood cultures are pending. Patient was started on Zosyn and vancomycin for antibiotic coverage.  LSN: Unclear  tPA Given: No: Acute/subacute ICH  mRankin: 4  SUBJECTIVE (INTERVAL HISTORY) No family members present. The patient's nurse and a sister are in the room. The patient is confused and restrained. She does follow simple commands. Dr. Pearlean Brownie will talk to the patient's family.  As per nurse, she was sent to SNF after a stroke in April. It seems she was gradually improving and was planned to go to ALF. Yesterday she had a fall and observed in SNF was considered OK and then he was transport to ALF. However, in ALF, she was found to be confused and EMS was called. CT showed mesial frontal small bleeding. Also found to have hyponatremia at 122 and WBC high at 20s. She was admitted to ICU for further work up. She is current smoker.   OBJECTIVE Temp:  [97.9 F (36.6 C)-99.5 F (37.5 C)] 98.2 F (36.8 C) (09/05 0530) Pulse Rate:  [96-132] 103 (09/05 0530) Cardiac Rhythm:  [-] Sinus tachycardia (09/04 2039) Resp:  [15-29] 20 (09/05 0530) BP: (116-169)/(48-139) 129/71 mmHg (09/05 0530) SpO2:  [93 %-100 %] 98 % (09/05 0530) Weight:  [136 lb 14.5 oz (62.1 kg)-137 lb 5.6 oz (62.3 kg)] 137 lb 5.6 oz (62.3 kg) (09/05 0530)  No results found for  this basename: GLUCAP,  in the last 168 hours  Recent Labs Lab 05/25/14 1832 05/26/14 0215 05/27/14 0320  NA 122* 126* 136*  K 3.3* 3.3* 2.8*  CL 78* 90* 101  CO2 GLUCOSE 113* 81 64*  BUN CREATININE 0.78 0.61 0.45*  CALCIUM 9.9 8.1* 7.9*  MG  --  1.2*  --   PHOS  --  3.1  --     Recent Labs Lab 05/25/14 1832 05/27/14 0320  AST 36 33  ALT 20 22  ALKPHOS 87 74  BILITOT 1.0 0.6  PROT 7.5 5.7*  ALBUMIN 3.4* 2.3*    Recent Labs Lab 05/25/14 1832 05/26/14 0215 05/27/14 0320  WBC 20.1* 16.2* 12.9*  NEUTROABS 18.4*  --  11.1*  HGB 12.6 10.3* 9.8*  HCT 35.0* 29.9* 28.8*  MCV 81.6 82.6 86.2  PLT 480* 368 359   No results found for this basename: CKTOTAL, CKMB, CKMBINDEX, TROPONINI,  in the last 168 hours No results found for this basename: LABPROT, INR,  in the last 72 hours  Recent Labs  05/25/14 1855  COLORURINE AMBER*  LABSPEC 1.018  PHURINE 7.5  GLUCOSEU NEGATIVE  HGBUR NEGATIVE  BILIRUBINUR NEGATIVE  KETONESUR NEGATIVE  PROTEINUR 30*  UROBILINOGEN 0.2  NITRITE NEGATIVE  LEUKOCYTESUR NEGATIVE       Component Value Date/Time   CHOL 104 05/27/2014 0320   No results found for this basename: HGBA1C   No results found for this basename: labopia,  cocainscrnur,  labbenz,  amphetmu,  thcu,  labbarb    No results found for this basename: ETH,  in the last 168 hours  Dg Chest 1 View   05/25/2014    IMPRESSION: No acute cardiopulmonary process.     Ct Head Wo Contrast  05/26/2014   IMPRESSION: 19 x 9 mm hematoma in the left gyrus rectus is size stable. Surrounding edema has mildly increased.      Ct Head Wo Contrast  05/25/2014   IMPRESSION: Acute LEFT inferior frontal hemorrhage affecting the LEFT gyrus rectus, roughly 9 x 19 mm cross-section, with a small halo of surrounding edema suggesting this bleed could be hours or even a few days old. No visible subarachnoid blood, skull fracture, or sinus air-fluid level. No similar abnormality on  prior CT scans.  Chronic changes as described with generalized atrophy, small vessel disease, and numerous areas of remote infarction.    Previous MRI 12/2013 IMPRESSION: 1. Acute nonhemorrhagic infarcts involving the white matter adjacent to the atrium of the left lateral ventricle. 2. Remote lacunar infarcts are present in the basal ganglia bilaterally. 3. Moderate atrophy and white matter disease likely reflects the sequela of chronic microvascular ischemia in the setting of other ischemic disease. 4. Left mastoid effusion. No obstructing nasopharyngeal lesion is evident.  Previous MRI 06/2010 IMPRESSION: Recent, subacute infarct extending from the posterior left basal ganglia to the deep white matter of the left centrum semiovale.  2D ehco -  - Left ventricle: The cavity size was normal. There was moderate concentric hypertrophy. Systolic function was vigorous. The estimated ejection fraction was in the range of 65% to 70%. Wall motion was normal; there were no regional wall motion abnormalities.   CUS- Bilateral: 1-39% ICA stenosis. Vertebral artery flow is antegrade.   PHYSICAL EXAM  Temp:  [97.9 F (36.6 C)-99.5 F (37.5 C)] 98.2 F (36.8 C) (09/05 0530) Pulse Rate:  [96-132] 103 (09/05 0530) Resp:  [15-29] 20 (09/05 0530) BP: (116-169)/(48-139) 129/71 mmHg (09/05 0530) SpO2:  [93 %-100 %] 98 % (09/05 0530) Weight:  [136 lb 14.5 oz (62.1 kg)-137 lb 5.6 oz (62.3 kg)] 137 lb 5.6 oz (62.3 kg) (09/05 0530)  General - in bed and restrained with a sitter present.  Ophthalmologic - not able to examine.  Cardiovascular - Regular rate and rhythm with no murmur.  Neck - supple  Neuro -  the patient answers questions however she is confused. She follows simple commands. She has diminished attention, registration and recall No focal weakness is noted. The patient moves all extremities within the bounds of her constraints. Gait not able to test.   ASSESSMENT/PLAN  Ms.  Danielle [name] is a 68 y.o. 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 CT showed left mesial frontal lobe small bleeding. Not sure if pt has acute change of mental status or pt at her mental baseline as the reports paradoxical. No family available for history. Pt bleeding is atypical in location for hypertensive bleed or CAA bleed. As per report, she had a fall on the day of admission, wondering if this is traumatic bleeding but no external lesion at skull. Repeat CT showed stable bleeding. Pt can not keep still for MRI.   Left mesial frontal small ICH - etiology not clear  1. Less likely to be HTN bleeding as no clear hx of HTN and BP stable during admission and not typical location for HTN  ICH 2. Could be CAA bleed, but a little young age, and also not typical location. However, it seems like pt has cognitive impairment at baseline. 3. Could be traumatic ICH, as pt reportedly had a fall, but no external lesion at the skull. 4. Hemorrhagic transfusion less likely from appearance but pt does have extensive stroke history as shown in previous MRIs. 5. Hemorrhage due to tumor, AVM, CVT needs further work up and ruled out.      clopidogrel 75 mg orally every day prior to admission, now on no antiplatelet or anticoagulation  MRI and MRA not able to perform due to not lying still - plan CT angiogram  Carotid Doppler  negative  2D Echo  unremarkable  Stroke lab pending  SCDs for VTE prophylaxis  NPO    Bedrest  Repeat CT head showed stable hematoma  Transfer to floor  Leukocytosis - trending down from 20.1 to 16.2 - on zosyn  - CCM on board - cultures NGTD - afebrile  - neck supple and low suspicious for meningitis and encephalitis at this time. If pt continued to have AMS, or developed fever, headache, stiff neck, please consider LP to evaluate.   Other Stroke Risk  Factors Advanced age Cigarette smoker, advised to stop smoking Consider nicotine patch Hx of recurrent CVAs with WM ischemic changes Stroke risk factor modification needed  Other active issue  Hyponatremia, CCM on board, on NS  Hypokalemia - supplementation ordered  Other Pertinent History  ? Dementia  ? Hx of migraine  May consider Notch gene testing to rule out CADASIL   Hospital day # 2  Delton See PA-C [facility] Pager [phone] 05/27/2014, 8:03 AM I have personally examined this patient, reviewed notes, independently viewed imaging studies, participated in medical decision making and plan of care. I have made any additions or clarifications directly to the above note. Agree with note above. . I. had a long discussion with the patient's daughter over the phone and discussed prognosis, plan for evaluation and answered questions.  Delia Heady, MD Medical Director [facility] Pager: [phone] 05/27/2014 12:57 PM     To contact Stroke Continuity provider, please refer to [email or web address]. After hours, contact General Neurology

## 2014-05-27 NOTE — Progress Notes (Signed)
Called to administer PRN breathing tx for increased SOB. Pt noted to be dyspneic upon RT arrival, pulling off leads, but not necessarily agitated. Pt with audible rhonchi, encouraged to cough, but had weak, non productive cough. HR 111bpm, SpO2 90% on RA, RR 28bpm. Rhonchi and course crackles heard bilaterally. CXR done this am was clear. Albuterol 2.5mg  administed via 21% RA. After tx, pt still sounding coarse, encouraged to cough again, this time cough was stronger, but still nonproductive. No real change is status post tx. 2L [employer] [place] applied due to lower O2 sats. RT will continue to monitor.

## 2014-05-28 LAB — RENAL FUNCTION PANEL
Albumin: 2.2 g/dL — ABNORMAL LOW (ref 3.5–5.2)
Anion gap: 12 (ref 5–15)
BUN: 7 mg/dL (ref 6–23)
CALCIUM: 7.9 mg/dL — AB (ref 8.4–10.5)
CO2: 20 meq/L (ref 19–32)
CREATININE: 0.39 mg/dL — AB (ref 0.50–1.10)
Chloride: 103 mEq/L (ref 96–112)
GFR calc Af Amer: >90 mL/min (ref >90)
GLUCOSE: 129 mg/dL — AB (ref 70–99)
Phosphorus: 1.2 mg/dL — ABNORMAL LOW (ref 2.3–4.6)
Potassium: 3.9 mEq/L (ref 3.7–5.3)
Sodium: 135 mEq/L — ABNORMAL LOW (ref 137–147)

## 2014-05-28 LAB — PROCALCITONIN: Procalcitonin: 0.13 ng/mL

## 2014-05-28 LAB — CLOSTRIDIUM DIFFICILE BY PCR: Toxigenic C. Difficile by PCR: POSITIVE — AB

## 2014-05-28 MED ORDER — SERTRALINE HCL 50 MG PO TABS
50.0000 mg | ORAL_TABLET | Freq: Every day | ORAL | Status: DC
  Administered 2014-05-28 – 2014-05-30 (×3): 50 mg via ORAL
  Filled 2014-05-28 (×3): qty 1

## 2014-05-28 MED ORDER — METRONIDAZOLE 500 MG PO TABS
500.0000 mg | ORAL_TABLET | Freq: Three times a day (TID) | ORAL | Status: DC
  Administered 2014-05-28 – 2014-05-30 (×6): 500 mg via ORAL
  Filled 2014-05-28 (×9): qty 1

## 2014-05-28 MED ORDER — MIRTAZAPINE 30 MG PO TABS
30.0000 mg | ORAL_TABLET | Freq: Every day | ORAL | Status: DC
  Administered 2014-05-28 – 2014-05-29 (×2): 30 mg via ORAL
  Filled 2014-05-28 (×3): qty 1

## 2014-05-28 MED ORDER — LORAZEPAM 2 MG/ML IJ SOLN
1.0000 mg | Freq: Once | INTRAMUSCULAR | Status: AC
  Administered 2014-05-29: 0.5 mg via INTRAVENOUS
  Filled 2014-05-28: qty 1

## 2014-05-28 NOTE — ED Provider Notes (Signed)
Medical screening examination/treatment/procedure(s) were conducted as a shared visit with non-physician practitioner(s) and myself.  I personally evaluated the patient during the encounter.   EKG Interpretation   Date/Time:  Thursday May 25 2014 18:13:54 EDT Ventricular Rate:  136 PR Interval:  116 QRS Duration: 89 QT Interval:  310 QTC Calculation: 466 R Axis:   -83 Text Interpretation:  Sinus tachycardia Ventricular premature complex  Abnormal R-wave progression, late transition Inferior infarct, old  Baseline wander in lead(s) II III aVF Confirmed by Freida Busman  MD, Kaylani Fromme  ([id number]) on 05/25/2014 7:18:40 PM       Toy Baker, MD 05/28/14 [phone]

## 2014-05-28 NOTE — Progress Notes (Addendum)
[facility] PROGRESS NOTE Interim History: 68 y.o. F brought to [facility] on 9/3 from [facility] assisted [facility] with complaints of AMS and fever to 101.3. Pt was transferred from [facility] to [facility] earlier in the evening and apparently suffered a fall around 730 AM day of presentation. She was cleared by a physician following fall. In ED, pt was found to have a small ICH. Neurology was consulted and requested transfer to [facility]. PCCM was consulted for admission.  Assessment/Plan: ICH (intracerebral hemorrhage)/Acute encephalopathy - neurology on board. - no obvious infectious source. - Dys 1 diet thin liq. - CT angio head Stable 1.9 x 0.9 x 0.8 cm hematoma inferior medial left frontal lobe with surrounding vasogenic edema.   Hyponatremia/Hypokalemia: - hypo osmolar/hyponatremia. - Improving with IV fluids. - pre-renal etiology. - check a renal panel  Most likely SIRS: - Fever leukocytosis and procalcitonin acting like acute phase reactant to ICH. - BCx2 9/3 UCx remained negative off antibiotics.  C. Dif colitis: - flagyl orally.  Code Status: full Family Communication: none Disposition Plan:  inpatinet    Consultants:  Neurology  Procedures: CT Head 9/3 >>> Acute inferior frontal hemorrhage affecting the left gyrus rectus, roughly 9 x 19 mm, with a small halo of surrounding edema suggesting this bleed could be hours or even a few days old. No visible subarachnoid blood, skull fracture, or sinus air-fluid level. Chronic changes with generalized atrophy CT head 9.4: 19 x 9 mm hematoma in the left gyrus rectus is size stable.  Surrounding edema has mildly increased.  Antibiotics:  As above.  HPI/Subjective: No complains todays, want to get up and walk.  Objective: Filed Vitals:   05/28/14 0014 05/28/14 0500 05/28/14 0509 05/28/14 0532  BP: 136/69   132/64  Pulse: 105   97  Temp: 98.4 F (36.9 C)   98.1 F (36.7 C)  TempSrc: Oral   Oral  Resp:  20   20  Height:      Weight:  63.5 kg (139 lb 15.9 oz)    SpO2: 97%  96% 98%    Intake/Output Summary (Last 24 hours) at 05/28/14 0921 Last data filed at 05/28/14 0606  Gross per 24 hour  Intake      0 ml  Output    700 ml  Net   -700 ml   Filed Weights   05/26/14 1958 05/27/14 0530 05/28/14 0500  Weight: 62.1 kg (136 lb 14.5 oz) 62.3 kg (137 lb 5.6 oz) 63.5 kg (139 lb 15.9 oz)    Exam:  General: Alert, awake, oriented x3, in no acute distress.  HEENT: No bruits, no goiter.  Heart: Regular rate and rhythm. Lungs: Good air movement, clear Abdomen: Soft, nontender, nondistended, positive bowel sounds.    Data Reviewed: Basic Metabolic Panel:  Recent Labs Lab 05/25/14 1832 05/26/14 0215 05/27/14 0320  NA 122* 126* 136*  K 3.3* 3.3* 2.8*  CL 78* 90* 101  CO2 GLUCOSE 113* 81 64*  BUN CREATININE 0.78 0.61 0.45*  CALCIUM 9.9 8.1* 7.9*  MG  --  1.2*  --   PHOS  --  3.1  --    Liver Function Tests:  Recent Labs Lab 05/25/14 1832 05/27/14 0320  AST 36 33  ALT 20 22  ALKPHOS 87 74  BILITOT 1.0 0.6  PROT 7.5 5.7*  ALBUMIN 3.4* 2.3*   No results found for this basename: LIPASE, AMYLASE,  in the last 168  hours  Recent Labs Lab 05/25/14 2050  AMMONIA 22   CBC:  Recent Labs Lab 05/25/14 1832 05/26/14 0215 05/27/14 0320  WBC 20.1* 16.2* 12.9*  NEUTROABS 18.4*  --  11.1*  HGB 12.6 10.3* 9.8*  HCT 35.0* 29.9* 28.8*  MCV 81.6 82.6 86.2  PLT 480* 368 359   Cardiac Enzymes: No results found for this basename: CKTOTAL, CKMB, CKMBINDEX, TROPONINI,  in the last 168 hours BNP (last 3 results) No results found for this basename: PROBNP,  in the last 8760 hours CBG: No results found for this basename: GLUCAP,  in the last 168 hours  Recent Results (from the past 240 hour(s))  CULTURE, BLOOD (ROUTINE X 2)     Status: None   Collection Time    05/25/14  6:32 PM      Result Value Ref Range Status   Specimen Description BLOOD LEFT  FOREARM   Final   Special Requests BOTTLES DRAWN AEROBIC AND ANAEROBIC 5 ML   Final   Culture  Setup Time     Final   Value: 05/26/2014 00:36     Performed at [facility]   Culture     Final   Value:        BLOOD CULTURE RECEIVED NO GROWTH TO DATE CULTURE WILL BE HELD FOR 5 DAYS BEFORE ISSUING A FINAL NEGATIVE REPORT     Performed at [facility]   Report Status PENDING   Incomplete  CULTURE, BLOOD (ROUTINE X 2)     Status: None   Collection Time    05/25/14  6:32 PM      Result Value Ref Range Status   Specimen Description BLOOD LEFT ANTECUBITAL   Final   Special Requests BOTTLES DRAWN AEROBIC AND ANAEROBIC   Final   Culture  Setup Time     Final   Value: 05/25/2014 23:05     Performed at [facility]   Culture     Final   Value:        BLOOD CULTURE RECEIVED NO GROWTH TO DATE CULTURE WILL BE HELD FOR 5 DAYS BEFORE ISSUING A FINAL NEGATIVE REPORT     Performed at [facility]   Report Status PENDING   Incomplete  URINE CULTURE     Status: None   Collection Time    05/25/14  6:55 PM      Result Value Ref Range Status   Specimen Description URINE, RANDOM   Final   Special Requests NONE   Final   Culture  Setup Time     Final   Value: 05/26/2014 01:15     Performed at [facility] Count     Final   Value: NO GROWTH     Performed at [facility]   Culture     Final   Value: NO GROWTH     Performed at [facility]   Report Status 05/27/2014 FINAL   Final  MRSA PCR SCREENING     Status: None   Collection Time    05/26/14 12:58 AM      Result Value Ref Range Status   MRSA by PCR NEGATIVE  NEGATIVE Final   Comment:            The GeneXpert MRSA Assay (FDA     approved for NASAL specimens     only), is one component of a     comprehensive MRSA colonization  surveillance program. It is not     intended to diagnose MRSA     infection nor to guide or     monitor treatment for     MRSA infections.      Studies: Ct Angio Head W/cm &/or Wo Cm  05/27/2014   CLINICAL DATA:  68 year old female with history multiple infarcts. Hyperlipidemia. Follow-up intracranial hemorrhage. Encephalopathy.  EXAM: CT ANGIOGRAPHY HEAD  TECHNIQUE: Multidetector CT imaging of the head was performed using the standard protocol during bolus administration of intravenous contrast. Multiplanar CT image reconstructions and MIPs were obtained to evaluate the vascular anatomy.  CONTRAST:  50mL OMNIPAQUE IOHEXOL 350 MG/ML SOLN  COMPARISON:  05/26/2014 and 05/25/2014 head CT. 12/23/2013 brain MR.  FINDINGS: Stable 1.9 x 0.9 x 0.8 cm hematoma inferior medial left frontal lobe with surrounding vasogenic edema.  Prominent vessel along the undersurface of the hematoma may represent slow flow within an engorged vein versus result of small dural fistula.  Remote bilateral basal ganglia infarcts. Remote infarct anterior right thalamus.  Small vessel disease type changes.  Calcified plaque cavernous segment of the internal carotid artery bilaterally with moderate narrowing on the right and moderate to marked narrowing on the left.  Mild narrowing and irregularity of the M1 segment and A1 segment of the anterior cerebral artery bilaterally.  Fetal type contribution to the posterior cerebral artery.  Right vertebral artery is dominant. Narrowing of the distal vertebral arteries. Attenuated basilar artery partially explained by the fetal type circulation but also with findings suggestive of superimposed atherosclerotic type changes.  No intracranial enhancing lesion separate from the above described findings.  Review of the MIP images confirms the above findings.  IMPRESSION: Stable 1.9 x 0.9 x 0.8 cm hematoma inferior medial left frontal lobe with surrounding vasogenic edema.  Prominent vessel along the undersurface of the hematoma may represent slow flow within an engorged vein versus result of small dural fistula.  Remote infarcts, small vessel  disease type changes and intracranial atherosclerotic disease (most notable cavernous segment internal carotid arteries) as detailed above.   Electronically Signed   By: Bridgett Larsson M.D.   On: 05/27/2014 16:24   Ct Head Wo Contrast  05/26/2014   CLINICAL DATA:  Rule out hematoma expansion  EXAM: CT HEAD WITHOUT CONTRAST  TECHNIQUE: Contiguous axial images were obtained from the base of the skull through the vertex without intravenous contrast.  COMPARISON:  05/25/2014  FINDINGS: Even when accounting for repeat imaging, there is motion limitation, resulting in artifact at the mid portion of the scan.  The inferior, parasagittal left frontal lobe parenchymal hemorrhage is size stable at 19 mm in length by 9 mm in width. Surrounding edema has mildly increased, with local mass effect. No interval subarachnoid extension. No new hemorrhage.  Focal mild thickening at the level of the mid falx appears stable from previous, arguing against trace subdural hemorrhage.  Remote lacunar infarcts in the right thalamus, right putamen, and periatrial white matter on the left. No evidence of shift, herniation, or acute infarct.  IMPRESSION: 19 x 9 mm hematoma in the left gyrus rectus is size stable. Surrounding edema has mildly increased.   Electronically Signed   By: Tiburcio Pea M.D.   On: 05/26/2014 12:40   Dg Chest Port 1 View  05/28/2014   CLINICAL DATA:  Respiratory distress.  Productive cough.  EXAM: PORTABLE CHEST - 1 VIEW  COMPARISON:  05/27/2014  FINDINGS: Normal heart size and pulmonary vascularity. No focal airspace disease or consolidation  in the lungs. No blunting of costophrenic angles. No pneumothorax. Mediastinal contours appear intact. Degenerative changes in the shoulders.  IMPRESSION: No active disease.   Electronically Signed   By: Burman Nieves M.D.   On: 05/28/2014 00:47   Dg Chest Port 1 View  05/27/2014   CLINICAL DATA:  Assess edema  EXAM: PORTABLE CHEST - 1 VIEW  COMPARISON:  05/25/2014   FINDINGS: Normal heart size.  Clear lungs.  No pneumothorax.  IMPRESSION: No active disease.   Electronically Signed   By: Maryclare Bean M.D.   On: 05/27/2014 07:13    Scheduled Meds: . antiseptic oral rinse  7 mL Mouth Rinse q12n4p  . chlorhexidine  15 mL Mouth Rinse BID  . pantoprazole (PROTONIX) IV  40 mg Intravenous Q12H  . piperacillin-tazobactam (ZOSYN)  IV  3.375 g Intravenous Q8H   Continuous Infusions: . sodium chloride 100 mL/hr at 05/28/14 0502     Marinda Elk  [facility] Pager [phone]. If 8PM-8AM, please contact night-coverage at [email or web address], password [facility] 05/28/2014, 9:21 AM  LOS: 3 days      **Disclaimer: This note may have been dictated with voice recognition software. Similar sounding words can inadvertently be transcribed and this note may contain transcription errors which may not have been corrected upon publication of note.**

## 2014-05-28 NOTE — Progress Notes (Signed)
STROKE TEAM PROGRESS NOTE   HISTORY Chief Complaint: Encephalopathic state with acute intracerebral hemorrhage.  HPI: Danielle [name] is an 68 y.o. female history of gastrointestinal bleeding, anemia, depression and anxiety, as well as hyperlipidemia, who was brought to the emergency room from assisted living facility with altered mental status. It's unclear when patient was last seen well. CT scan of her head showed a 9 X 19 mm left inferior frontal hemorrhage with surrounding edema. No subarachnoid blood is seen. Patient also is hyponatremic with sodium of 122. She was febrile and WBC count was 20.1K. Urinalysis was unremarkable. Serum ammonia was normal. Blood cultures are pending. Patient was started on Zosyn and vancomycin for antibiotic coverage.  LSN: Unclear  tPA Given: No: Acute/subacute ICH  mRankin: 4  SUBJECTIVE (INTERVAL HISTORY) No family members present.   . The patient is confused and restrained. She does follow simple commands.   As per nurse, she was sent to SNF after a stroke in April. It seems she was gradually improving and was planned to go to ALF. Yesterday she had a fall and observed in SNF was considered OK and then he was transport to ALF. However, in ALF, she was found to be confused and EMS was called. CT showed mesial frontal small bleeding. Also found to have hyponatremia at 122 and WBC high at 20s. She was admitted to ICU for further work up. She is current smoker.   OBJECTIVE Temp:  [98 F (36.7 C)-98.6 F (37 C)] 98 F (36.7 C) (09/06 1324) Pulse Rate:  [97-114] 98 (09/06 1324) Cardiac Rhythm:  [-] Normal sinus rhythm;Sinus tachycardia (09/06 0831) Resp:  [16-20] 19 (09/06 1324) BP: (120-141)/(52-69) 141/61 mmHg (09/06 1324) SpO2:  [90 %-100 %] 100 % (09/06 1324) Weight:  [139 lb 15.9 oz (63.5 kg)] 139 lb 15.9 oz (63.5 kg) (09/06 0500)  No results found for this basename: GLUCAP,  in the last 168 hours  Recent Labs Lab 05/25/14 1832 05/26/14 0215  05/27/14 0320 05/28/14 1013  NA 122* 126* 136* 135*  K 3.3* 3.3* 2.8* 3.9  CL 78* 90* 101 103  CO2 GLUCOSE 113* 81 64* 129*  BUN CREATININE 0.78 0.61 0.45* 0.39*  CALCIUM 9.9 8.1* 7.9* 7.9*  MG  --  1.2*  --   --   PHOS  --  3.1  --  1.2*    Recent Labs Lab 05/25/14 1832 05/27/14 0320 05/28/14 1013  AST 36 33  --   ALT 20 22  --   ALKPHOS 87 74  --   BILITOT 1.0 0.6  --   PROT 7.5 5.7*  --   ALBUMIN 3.4* 2.3* 2.2*    Recent Labs Lab 05/25/14 1832 05/26/14 0215 05/27/14 0320  WBC 20.1* 16.2* 12.9*  NEUTROABS 18.4*  --  11.1*  HGB 12.6 10.3* 9.8*  HCT 35.0* 29.9* 28.8*  MCV 81.6 82.6 86.2  PLT 480* 368 359   No results found for this basename: CKTOTAL, CKMB, CKMBINDEX, TROPONINI,  in the last 168 hours No results found for this basename: LABPROT, INR,  in the last 72 hours  Recent Labs  05/25/14 1855  COLORURINE AMBER*  LABSPEC 1.018  PHURINE 7.5  GLUCOSEU NEGATIVE  HGBUR NEGATIVE  BILIRUBINUR NEGATIVE  KETONESUR NEGATIVE  PROTEINUR 30*  UROBILINOGEN 0.2  NITRITE NEGATIVE  LEUKOCYTESUR NEGATIVE       Component Value Date/Time   CHOL 104 05/27/2014 0320   Lab Results  Component Value Date   HGBA1C 5.6 05/27/2014   No results found for this basename: labopia,  cocainscrnur,  labbenz,  amphetmu,  thcu,  labbarb    No results found for this basename: ETH,  in the last 168 hours  Dg Chest 1 View   05/25/2014    IMPRESSION: No acute cardiopulmonary process.     Ct Head Wo Contrast  05/26/2014   IMPRESSION: 19 x 9 mm hematoma in the left gyrus rectus is size stable. Surrounding edema has mildly increased.      Ct Head Wo Contrast  05/25/2014   IMPRESSION: Acute LEFT inferior frontal hemorrhage affecting the LEFT gyrus rectus, roughly 9 x 19 mm cross-section, with a small halo of surrounding edema suggesting this bleed could be hours or even a few days old. No visible subarachnoid blood, skull fracture, or sinus air-fluid level. No  similar abnormality on prior CT scans.  Chronic changes as described with generalized atrophy, small vessel disease, and numerous areas of remote infarction.    Previous MRI 12/2013 IMPRESSION: 1. Acute nonhemorrhagic infarcts involving the white matter adjacent to the atrium of the left lateral ventricle. 2. Remote lacunar infarcts are present in the basal ganglia bilaterally. 3. Moderate atrophy and white matter disease likely reflects the sequela of chronic microvascular ischemia in the setting of other ischemic disease. 4. Left mastoid effusion. No obstructing nasopharyngeal lesion is evident.  Previous MRI 06/2010 IMPRESSION: Recent, subacute infarct extending from the posterior left basal ganglia to the deep white matter of the left centrum semiovale.  2D ehco -  - Left ventricle: The cavity size was normal. There was moderate concentric hypertrophy. Systolic function was vigorous. The estimated ejection fraction was in the range of 65% to 70%. Wall motion was normal; there were no regional wall motion abnormalities.   CUS- Bilateral: 1-39% ICA stenosis. Vertebral artery flow is antegrade. CT angiogram brain 05/27/14 : Stable 1.9 x 0.9 x 0.8 cm hematoma inferior medial left frontal lobe  with surrounding vasogenic edema.  Prominent vessel along the undersurface of the hematoma may represent slow flow within an engorged vein versus result of small dural fistula.   PHYSICAL EXAM  Temp:  [98 F (36.7 C)-98.6 F (37 C)] 98 F (36.7 C) (09/06 1324) Pulse Rate:  [97-114] 98 (09/06 1324) Resp:  [16-20] 19 (09/06 1324) BP: (120-141)/(52-69) 141/61 mmHg (09/06 1324) SpO2:  [90 %-100 %] 100 % (09/06 1324) Weight:  [139 lb 15.9 oz (63.5 kg)] 139 lb 15.9 oz (63.5 kg) (09/06 0500)  General - in bed and restrained with a sitter present.  Ophthalmologic - not able to examine.  Cardiovascular - Regular rate and rhythm with no murmur.  Neck - supple  Neuro -  the patient answers  questions however she is confused. She follows simple commands. She has diminished attention, registration and recall No focal weakness is noted. The patient moves all extremities within the bounds of her constraints. Gait not able to test.   ASSESSMENT/PLAN  Ms. Danielle [name] is a 68 y.o. 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 CT showed left mesial frontal lobe small bleeding. Not sure if pt has acute change of mental status or pt at her mental baseline as the reports paradoxical. No family available for history. Pt bleeding is atypical in location for hypertensive bleed or CAA bleed. As per report, she had a fall on the day of  admission, wondering if this is traumatic bleeding but no external lesion at skull. Repeat CT showed stable bleeding. Pt can not keep still for MRI.   Left mesial frontal small ICH - etiology not clear   CT angiogram shows no definite aneurysm or AVM but engorged vessel inferior to the hematoma which may be a dural AVM or dilated vein and will need followup       clopidogrel 75 mg orally every day prior to admission, now on no antiplatelet or anticoagulation  MRI and MRA not able to perform due to not lying still - plan CT angiogram  Carotid Doppler  negative  2D Echo  unremarkable  Stroke lab pending  SCDs for VTE prophylaxis  Dysphagia    Bedrest  Repeat CT head showed stable hematoma  Transfer to floor  Leukocytosis - trending down from 20.1 to 16.2 - on zosyn  - CCM on board - cultures NGTD - afebrile  - neck supple and low suspicious for meningitis and encephalitis at this time. If pt continued to have AMS, or developed fever, headache, stiff neck, please consider LP to evaluate.   Other Stroke Risk Factors Advanced age Cigarette smoker, advised to stop smoking Consider nicotine patch Hx of recurrent CVAs with WM ischemic  changes Stroke risk factor modification needed  Other active issue  Hyponatremia, CCM on board, on NS  Hypokalemia - supplementation ordered  Other Pertinent History  ? Dementia  ? Hx of migraine   Hospital day # 3    I have personally examined this patient, reviewed notes, independently viewed imaging studies, participated in medical decision making and plan of care. I have made any additions or clarifications directly to the above note. Agree with note above. . I. had a long discussion with the patient's daughter over the phone and discussed prognosis, plan for evaluation and answered questions.  Delia Heady, MD Medical Director [facility] Pager: [phone] 05/28/2014 1:28 PM     To contact Stroke Continuity provider, please refer to [email or web address]. After hours, contact General Neurology

## 2014-05-29 ENCOUNTER — Encounter (HOSPITAL_COMMUNITY): Payer: Self-pay | Admitting: General Practice

## 2014-05-29 MED ORDER — PANTOPRAZOLE SODIUM 40 MG PO TBEC
40.0000 mg | DELAYED_RELEASE_TABLET | Freq: Two times a day (BID) | ORAL | Status: DC
  Administered 2014-05-29 – 2014-05-30 (×3): 40 mg via ORAL
  Filled 2014-05-29 (×3): qty 1

## 2014-05-29 MED ORDER — HALOPERIDOL LACTATE 5 MG/ML IJ SOLN
2.0000 mg | Freq: Four times a day (QID) | INTRAMUSCULAR | Status: DC | PRN
  Administered 2014-05-29: 2 mg via INTRAVENOUS
  Filled 2014-05-29: qty 1

## 2014-05-29 MED ORDER — QUETIAPINE FUMARATE 50 MG PO TABS
50.0000 mg | ORAL_TABLET | Freq: Every day | ORAL | Status: DC
  Administered 2014-05-29: 50 mg via ORAL
  Filled 2014-05-29 (×2): qty 1

## 2014-05-29 MED ORDER — NICOTINE 14 MG/24HR TD PT24
14.0000 mg | MEDICATED_PATCH | Freq: Every day | TRANSDERMAL | Status: DC
  Administered 2014-05-29 – 2014-05-30 (×2): 14 mg via TRANSDERMAL
  Filled 2014-05-29 (×3): qty 1

## 2014-05-29 MED ORDER — HALOPERIDOL LACTATE 5 MG/ML IJ SOLN
5.0000 mg | Freq: Four times a day (QID) | INTRAMUSCULAR | Status: DC | PRN
  Administered 2014-05-29: 5 mg via INTRAVENOUS
  Filled 2014-05-29: qty 1

## 2014-05-29 MED ORDER — HALOPERIDOL LACTATE 5 MG/ML IJ SOLN
4.0000 mg | Freq: Once | INTRAMUSCULAR | Status: AC
  Administered 2014-05-29: 4 mg via INTRAVENOUS
  Filled 2014-05-29: qty 1

## 2014-05-29 NOTE — Progress Notes (Signed)
[facility] PROGRESS NOTE Interim History: 68 y.o. F brought to [facility] on 9/3 from [facility] with complaints of AMS and fever to 101.3. Pt was transferred from [facility] to [facility] place earlier in the evening and apparently suffered a fall around 730 AM day of presentation. She was cleared by a physician following fall. In ED, pt was found to have a small ICH. Neurology was consulted and requested transfer to [facility]. PCCM was consulted for admission.  Assessment/Plan: ICH (intracerebral hemorrhage)/Acute encephalopathy: - unclear etiology of ICH, pt was on plavix on admsision. - neurology on board. - no obvious infectious source. - Dys 1 diet thin liq. - CT angio head Stable 1.9 x 0.9 x 0.8 cm hematoma inferior medial left frontal lobe with surrounding vasogenic edema.  Carotid Doppler negative 2D Echo unremarkable. - Awaiting PT evaluation.  Hyponatremia/Hypokalemia: - hypo osmolar/hyponatremia. - Resolved with IV fluids. - pre-renal etiology.  Most likely SIRS: - Fever leukocytosis and procalcitonin acting like acute phase reactant to ICH. - BCx2 9/3 UCx remained negative off antibiotics.  C. Dif colitis: - flagyl orally.  Code Status: full Family Communication: none Disposition Plan:  inpatinet    Consultants:  Neurology  Procedures: CT Head 9/3 >>> Acute inferior frontal hemorrhage affecting the left gyrus rectus, roughly 9 x 19 mm, with a small halo of surrounding edema suggesting this bleed could be hours or even a few days old. No visible subarachnoid blood, skull fracture, or sinus air-fluid level. Chronic changes with generalized atrophy CT head 9.4: 19 x 9 mm hematoma in the left gyrus rectus is size stable.  Surrounding edema has mildly increased.  Antibiotics:  As above.  HPI/Subjective: No complains todays, want to get up and walk.  Objective: Filed Vitals:   05/28/14 1324 05/28/14 1953 05/28/14 2349 05/29/14 0526  BP:  141/61 171/67 166/76 155/71  Pulse: 98 103 86 88  Temp: 98 F (36.7 C) 98.1 F (36.7 C) 98.7 F (37.1 C) 98.2 F (36.8 C)  TempSrc: Oral Oral Oral Oral  Resp: Height:      Weight:    63.6 kg (140 lb 3.4 oz)  SpO2: 100% 99% 96% 97%    Intake/Output Summary (Last 24 hours) at 05/29/14 1021 Last data filed at 05/29/14 0900  Gross per 24 hour  Intake 5148.33 ml  Output   3700 ml  Net 1448.33 ml   Filed Weights   05/27/14 0530 05/28/14 0500 05/29/14 0526  Weight: 62.3 kg (137 lb 5.6 oz) 63.5 kg (139 lb 15.9 oz) 63.6 kg (140 lb 3.4 oz)    Exam:  General: Alert, awake, oriented x3, in no acute distress.  HEENT: No bruits, no goiter.  Heart: Regular rate and rhythm. Lungs: Good air movement, clear Abdomen: Soft, nontender, nondistended, positive bowel sounds.    Data Reviewed: Basic Metabolic Panel:  Recent Labs Lab 05/25/14 1832 05/26/14 0215 05/27/14 0320 05/28/14 1013  NA 122* 126* 136* 135*  K 3.3* 3.3* 2.8* 3.9  CL 78* 90* 101 103  CO2 GLUCOSE 113* 81 64* 129*  BUN CREATININE 0.78 0.61 0.45* 0.39*  CALCIUM 9.9 8.1* 7.9* 7.9*  MG  --  1.2*  --   --   PHOS  --  3.1  --  1.2*   Liver Function Tests:  Recent Labs Lab 05/25/14 1832 05/27/14 0320 05/28/14 1013  AST 36 33  --   ALT  20 22  --   ALKPHOS 87 74  --   BILITOT 1.0 0.6  --   PROT 7.5 5.7*  --   ALBUMIN 3.4* 2.3* 2.2*   No results found for this basename: LIPASE, AMYLASE,  in the last 168 hours  Recent Labs Lab 05/25/14 2050  AMMONIA 22   CBC:  Recent Labs Lab 05/25/14 1832 05/26/14 0215 05/27/14 0320  WBC 20.1* 16.2* 12.9*  NEUTROABS 18.4*  --  11.1*  HGB 12.6 10.3* 9.8*  HCT 35.0* 29.9* 28.8*  MCV 81.6 82.6 86.2  PLT 480* 368 359   Cardiac Enzymes: No results found for this basename: CKTOTAL, CKMB, CKMBINDEX, TROPONINI,  in the last 168 hours BNP (last 3 results) No results found for this basename: PROBNP,  in the last 8760  hours CBG: No results found for this basename: GLUCAP,  in the last 168 hours  Recent Results (from the past 240 hour(s))  CULTURE, BLOOD (ROUTINE X 2)     Status: None   Collection Time    05/25/14  6:32 PM      Result Value Ref Range Status   Specimen Description BLOOD LEFT FOREARM   Final   Special Requests BOTTLES DRAWN AEROBIC AND ANAEROBIC 5 ML   Final   Culture  Setup Time     Final   Value: 05/26/2014 00:36     Performed at [facility]   Culture     Final   Value:        BLOOD CULTURE RECEIVED NO GROWTH TO DATE CULTURE WILL BE HELD FOR 5 DAYS BEFORE ISSUING A FINAL NEGATIVE REPORT     Performed at [facility]   Report Status PENDING   Incomplete  CULTURE, BLOOD (ROUTINE X 2)     Status: None   Collection Time    05/25/14  6:32 PM      Result Value Ref Range Status   Specimen Description BLOOD LEFT ANTECUBITAL   Final   Special Requests BOTTLES DRAWN AEROBIC AND ANAEROBIC   Final   Culture  Setup Time     Final   Value: 05/25/2014 23:05     Performed at [facility]   Culture     Final   Value:        BLOOD CULTURE RECEIVED NO GROWTH TO DATE CULTURE WILL BE HELD FOR 5 DAYS BEFORE ISSUING A FINAL NEGATIVE REPORT     Performed at [facility]   Report Status PENDING   Incomplete  URINE CULTURE     Status: None   Collection Time    05/25/14  6:55 PM      Result Value Ref Range Status   Specimen Description URINE, RANDOM   Final   Special Requests NONE   Final   Culture  Setup Time     Final   Value: 05/26/2014 01:15     Performed at [facility] Count     Final   Value: NO GROWTH     Performed at [facility]   Culture     Final   Value: NO GROWTH     Performed at [facility]   Report Status 05/27/2014 FINAL   Final  MRSA PCR SCREENING     Status: None   Collection Time    05/26/14 12:58 AM      Result Value Ref Range Status   MRSA by PCR NEGATIVE  NEGATIVE Final  Comment:            The  GeneXpert MRSA Assay (FDA     approved for NASAL specimens     only), is one component of a     comprehensive MRSA colonization     surveillance program. It is not     intended to diagnose MRSA     infection nor to guide or     monitor treatment for     MRSA infections.  CLOSTRIDIUM DIFFICILE BY PCR     Status: Abnormal   Collection Time    05/27/14 10:55 PM      Result Value Ref Range Status   C difficile by pcr POSITIVE (*) NEGATIVE Final   Comment: CRITICAL RESULT CALLED TO, READ BACK BY AND VERIFIED WITH:     Roxy Cedar RN AT 1610 05/28/14 BY Miquel Dunn     Studies: Ct Angio Head W/cm &/or Wo Cm  05/27/2014   CLINICAL DATA:  68 year old female with history multiple infarcts. Hyperlipidemia. Follow-up intracranial hemorrhage. Encephalopathy.  EXAM: CT ANGIOGRAPHY HEAD  TECHNIQUE: Multidetector CT imaging of the head was performed using the standard protocol during bolus administration of intravenous contrast. Multiplanar CT image reconstructions and MIPs were obtained to evaluate the vascular anatomy.  CONTRAST:  50mL OMNIPAQUE IOHEXOL 350 MG/ML SOLN  COMPARISON:  05/26/2014 and 05/25/2014 head CT. 12/23/2013 brain MR.  FINDINGS: Stable 1.9 x 0.9 x 0.8 cm hematoma inferior medial left frontal lobe with surrounding vasogenic edema.  Prominent vessel along the undersurface of the hematoma may represent slow flow within an engorged vein versus result of small dural fistula.  Remote bilateral basal ganglia infarcts. Remote infarct anterior right thalamus.  Small vessel disease type changes.  Calcified plaque cavernous segment of the internal carotid artery bilaterally with moderate narrowing on the right and moderate to marked narrowing on the left.  Mild narrowing and irregularity of the M1 segment and A1 segment of the anterior cerebral artery bilaterally.  Fetal type contribution to the posterior cerebral artery.  Right vertebral artery is dominant. Narrowing of the distal vertebral arteries.  Attenuated basilar artery partially explained by the fetal type circulation but also with findings suggestive of superimposed atherosclerotic type changes.  No intracranial enhancing lesion separate from the above described findings.  Review of the MIP images confirms the above findings.  IMPRESSION: Stable 1.9 x 0.9 x 0.8 cm hematoma inferior medial left frontal lobe with surrounding vasogenic edema.  Prominent vessel along the undersurface of the hematoma may represent slow flow within an engorged vein versus result of small dural fistula.  Remote infarcts, small vessel disease type changes and intracranial atherosclerotic disease (most notable cavernous segment internal carotid arteries) as detailed above.   Electronically Signed   By: Bridgett Larsson M.D.   On: 05/27/2014 16:24   Dg Chest Port 1 View  05/28/2014   CLINICAL DATA:  Respiratory distress.  Productive cough.  EXAM: PORTABLE CHEST - 1 VIEW  COMPARISON:  05/27/2014  FINDINGS: Normal heart size and pulmonary vascularity. No focal airspace disease or consolidation in the lungs. No blunting of costophrenic angles. No pneumothorax. Mediastinal contours appear intact. Degenerative changes in the shoulders.  IMPRESSION: No active disease.   Electronically Signed   By: Burman Nieves M.D.   On: 05/28/2014 00:47    Scheduled Meds: . antiseptic oral rinse  7 mL Mouth Rinse q12n4p  . chlorhexidine  15 mL Mouth Rinse BID  . metroNIDAZOLE  500 mg Oral 3 times per day  .  mirtazapine  30 mg Oral QHS  . pantoprazole  40 mg Oral BID AC  . sertraline  50 mg Oral Daily   Continuous Infusions: . sodium chloride 100 mL (05/28/14 1413)     Radonna Ricker Rosine Beat  [facility] Pager [phone]. If 8PM-8AM, please contact night-coverage at [email or web address], password [email or web address] 05/29/2014, 10:21 AM  LOS: 4 days      **Disclaimer: This note may have been dictated with voice recognition software. Similar sounding words can inadvertently be transcribed and this  note may contain transcription errors which may not have been corrected upon publication of note.**

## 2014-05-29 NOTE — Progress Notes (Signed)
STROKE TEAM PROGRESS NOTE   HISTORY Chief Complaint: Encephalopathic state with acute intracerebral hemorrhage.  HPI: Danielle [name] is an 68 y.o. female history of gastrointestinal bleeding, anemia, depression and anxiety, as well as hyperlipidemia, who was brought to the emergency room from assisted living facility with altered mental status. It's unclear when patient was last seen well. CT scan of her head showed a 9 X 19 mm left inferior frontal hemorrhage with surrounding edema. No subarachnoid blood is seen. Patient also is hyponatremic with sodium of 122. She was febrile and WBC count was 20.1K. Urinalysis was unremarkable. Serum ammonia was normal. Blood cultures are pending. Patient was started on Zosyn and vancomycin for antibiotic coverage.  LSN: Unclear  tPA Given: No: Acute/subacute ICH  mRankin: 4  SUBJECTIVE (INTERVAL HISTORY) No family members present.   . The patient is confused and restrained. She does follow simple commands.   As per nurse, she was sent to SNF after a stroke in April. It seems she was gradually improving and was planned to go to ALF. Yesterday she had a fall and observed in SNF was considered OK and then he was transport to ALF. However, in ALF, she was found to be confused and EMS was called. CT showed mesial frontal small bleeding. Also found to have hyponatremia at 122 and WBC high at 20s. She was admitted to ICU for further work up. She is current smoker.   OBJECTIVE Temp:  [98.1 F (36.7 C)-98.7 F (37.1 C)] 98.3 F (36.8 C) (09/07 1300) Pulse Rate:  [86-103] 95 (09/07 1300) Cardiac Rhythm:  [-] Sinus tachycardia (09/07 0820) Resp:  [18-20] 20 (09/07 1300) BP: (155-182)/(67-90) 182/90 mmHg (09/07 1300) SpO2:  [96 %-100 %] 100 % (09/07 1300) Weight:  [140 lb 3.4 oz (63.6 kg)] 140 lb 3.4 oz (63.6 kg) (09/07 0526)  No results found for this basename: GLUCAP,  in the last 168 hours  Recent Labs Lab 05/25/14 1832 05/26/14 0215 05/27/14 0320  05/28/14 1013  NA 122* 126* 136* 135*  K 3.3* 3.3* 2.8* 3.9  CL 78* 90* 101 103  CO2 GLUCOSE 113* 81 64* 129*  BUN CREATININE 0.78 0.61 0.45* 0.39*  CALCIUM 9.9 8.1* 7.9* 7.9*  MG  --  1.2*  --   --   PHOS  --  3.1  --  1.2*    Recent Labs Lab 05/25/14 1832 05/27/14 0320 05/28/14 1013  AST 36 33  --   ALT 20 22  --   ALKPHOS 87 74  --   BILITOT 1.0 0.6  --   PROT 7.5 5.7*  --   ALBUMIN 3.4* 2.3* 2.2*    Recent Labs Lab 05/25/14 1832 05/26/14 0215 05/27/14 0320  WBC 20.1* 16.2* 12.9*  NEUTROABS 18.4*  --  11.1*  HGB 12.6 10.3* 9.8*  HCT 35.0* 29.9* 28.8*  MCV 81.6 82.6 86.2  PLT 480* 368 359   No results found for this basename: CKTOTAL, CKMB, CKMBINDEX, TROPONINI,  in the last 168 hours No results found for this basename: LABPROT, INR,  in the last 72 hours No results found for this basename: COLORURINE, APPERANCEUR, LABSPEC, PHURINE, GLUCOSEU, HGBUR, BILIRUBINUR, KETONESUR, PROTEINUR, UROBILINOGEN, NITRITE, LEUKOCYTESUR,  in the last 72 hours     Component Value Date/Time   CHOL 104 05/27/2014 0320   Lab Results  Component Value Date   HGBA1C 5.6 05/27/2014   No results found for this basename: labopia,  cocainscrnur,  labbenz,  amphetmu,  thcu,  labbarb    No results found for this basename: ETH,  in the last 168 hours  Dg Chest 1 View   05/25/2014    IMPRESSION: No acute cardiopulmonary process.     Ct Head Wo Contrast  05/26/2014   IMPRESSION: 19 x 9 mm hematoma in the left gyrus rectus is size stable. Surrounding edema has mildly increased.      Ct Head Wo Contrast  05/25/2014   IMPRESSION: Acute LEFT inferior frontal hemorrhage affecting the LEFT gyrus rectus, roughly 9 x 19 mm cross-section, with a small halo of surrounding edema suggesting this bleed could be hours or even a few days old. No visible subarachnoid blood, skull fracture, or sinus air-fluid level. No similar abnormality on prior CT scans.  Chronic changes as described  with generalized atrophy, small vessel disease, and numerous areas of remote infarction.    Previous MRI 12/2013 IMPRESSION: 1. Acute nonhemorrhagic infarcts involving the white matter adjacent to the atrium of the left lateral ventricle. 2. Remote lacunar infarcts are present in the basal ganglia bilaterally. 3. Moderate atrophy and white matter disease likely reflects the sequela of chronic microvascular ischemia in the setting of other ischemic disease. 4. Left mastoid effusion. No obstructing nasopharyngeal lesion is evident.  Previous MRI 06/2010 IMPRESSION: Recent, subacute infarct extending from the posterior left basal ganglia to the deep white matter of the left centrum semiovale.  2D ehco -  - Left ventricle: The cavity size was normal. There was moderate concentric hypertrophy. Systolic function was vigorous. The estimated ejection fraction was in the range of 65% to 70%. Wall motion was normal; there were no regional wall motion abnormalities.   CUS- Bilateral: 1-39% ICA stenosis. Vertebral artery flow is antegrade. CT angiogram brain 05/27/14 : Stable 1.9 x 0.9 x 0.8 cm hematoma inferior medial left frontal lobe  with surrounding vasogenic edema.  Prominent vessel along the undersurface of the hematoma may represent slow flow within an engorged vein versus result of small dural fistula.   PHYSICAL EXAM  Temp:  [98.1 F (36.7 C)-98.7 F (37.1 C)] 98.3 F (36.8 C) (09/07 1300) Pulse Rate:  [86-103] 95 (09/07 1300) Resp:  [18-20] 20 (09/07 1300) BP: (155-182)/(67-90) 182/90 mmHg (09/07 1300) SpO2:  [96 %-100 %] 100 % (09/07 1300) Weight:  [140 lb 3.4 oz (63.6 kg)] 140 lb 3.4 oz (63.6 kg) (09/07 0526)  General - in bed and restrained with a sitter present.  Ophthalmologic - not able to examine.  Cardiovascular - Regular rate and rhythm with no murmur.  Neck - supple  Neuro -  the patient answers questions however she is confused. She follows simple commands.  She has diminished attention, registration and recall No focal weakness is noted. The patient moves all extremities within the bounds of her constraints. Gait not able to test.   ASSESSMENT/PLAN  Ms. Danielle [name] is a 68 y.o. 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 CT showed left mesial frontal lobe small bleeding. Not sure if pt has acute change of mental status or pt at her mental baseline as the reports paradoxical. No family available for history. Pt bleeding is atypical in location for hypertensive bleed or CAA bleed. As per report, she had a fall on the day of admission, wondering if this is traumatic bleeding but no external lesion at skull. Repeat CT showed stable bleeding. Pt can  not keep still for MRI.   Left mesial frontal small ICH - etiology not clear   CT angiogram shows no definite aneurysm or AVM but engorged vessel inferior to the hematoma which may be a dural AVM or dilated vein and will need followup       clopidogrel 75 mg orally every day prior to admission, now on no antiplatelet or anticoagulation  MRI and MRA not able to perform due to not lying still - plan CT angiogram  Carotid Doppler  negative  2D Echo  unremarkable  Stroke lab pending  SCDs for VTE prophylaxis  General    Bedrest  Repeat CT head showed stable hematoma  Transfer to floor  Leukocytosis - trending down from 20.1 to 16.2 - on zosyn  - CCM on board - cultures NGTD - afebrile  - neck supple and low suspicious for meningitis and encephalitis at this time. If pt continued to have AMS, or developed fever, headache, stiff neck, please consider LP to evaluate.   Other Stroke Risk Factors Advanced age Cigarette smoker, advised to stop smoking Consider nicotine patch Hx of recurrent CVAs with WM ischemic changes Stroke risk factor modification needed  Other active  issue  Hyponatremia, CCM on board, on NS  Hypokalemia - supplementation ordered  Other Pertinent History  ? Dementia  ? Hx of migraine   Hospital day # 4   Stroke team will sign off. Family call for questions. Discussed with Dr. Gwenith Daily, MD Medical Director [facility] Pager: [phone] 05/29/2014 2:36 PM     To contact Stroke Continuity provider, please refer to [email or web address]. After hours, contact General Neurology

## 2014-05-29 NOTE — Clinical Social Work Psychosocial (Signed)
Clinical Social Work Department BRIEF PSYCHOSOCIAL ASSESSMENT 05/29/2014  Patient:  Danielle [name],Danielle [name]     Account Number:  [id number]     Admit date:  05/25/2014  Clinical Social Worker:  Lavell Luster  Date/Time:  05/29/2014 04:00 PM  Referred by:  Physician  Date Referred:  05/29/2014 Referred for  SNF Placement   Other Referral:   Interview type:  Family Other interview type:   Patient's daughter interviewed to complete assessment.    PSYCHOSOCIAL DATA Living Status:  FACILITY Admitted from facility:  [facility] Level of care:  Assisted Living Primary support name:  Danielle [name] Primary support relationship to patient:  CHILD, ADULT Degree of support available:   Support is good.    CURRENT CONCERNS Current Concerns  Post-Acute Placement   Other Concerns:   NA    SOCIAL WORK ASSESSMENT / PLAN CSW spoke with patient's daughter Danielle [name] by phone as no family currently at bedside and patient unable to contribute to assessment. Patient's daughter Danielle [name] reports that the patient has used all of her Medicare days at [facility] in [facility] and was actually discharged to [facility]. The patient was sent to the hospital as soon as she arrived at the ALF. Daughter states that she is agreeable to SNF placement but states that patient will have to go to a facility that accepts Medicaid as she is not able to pay privately for patient at SNF. CSW explained SNF search/placement process and answered daughter's questions. Danielle [name] voiced concern about the sitter being removed from her mother's room. CSW encouraged Danielle [name] to share these concerns with the physcian and RN. CSW did explain that the patient would need to be sitter free for 24 hrs prior to discharge. Daughter states that the patient "must" go to a facility in [facility]. CSW explained that this cannot be guaranteed, but referrals would be made to all [facility].   Assessment/plan status:  Psychosocial  Support/Ongoing Assessment of Needs Other assessment/ plan:   Complete Fl2, Fax, PASRR   Information/referral to community resources:   CSW contact information and SNF list given.    PATIENT'S/FAMILY'S RESPONSE TO PLAN OF CARE: Patient's daughter agreeable to SNF placement for patient. CSW will assist.       Roddie Mc MSW, [place], [place], [phone]

## 2014-05-29 NOTE — Care Management Note (Signed)
    Page 1 of 1   05/29/2014     1:35:04 PM CARE MANAGEMENT NOTE 05/29/2014  Patient:  Danielle [name],Danielle [name]   Account Number:  [id number]  Date Initiated:  05/29/2014  Documentation initiated by:  GRAVES-BIGELOW,Stasia Somero  Subjective/Objective Assessment:   Pt admitted for AMS and fever. Per MD notes pt was found to have a small ICH     Action/Plan:   CSW assisting with disposition needs.   Anticipated DC Date:  05/30/2014   Anticipated DC Plan:  SKILLED NURSING FACILITY  In-house referral  Clinical Social Worker      DC Planning Services  CM consult      Choice offered to / List presented to:             Status of service:  Completed, signed off Medicare Important Message given?  YES (If response is "NO", the following Medicare IM given date fields will be blank) Date Medicare IM given:  05/29/2014 Medicare IM given by:  GRAVES-BIGELOW,Bethene Hankinson Date Additional Medicare IM given:   Additional Medicare IM given by:    Discharge Disposition:  SKILLED NURSING FACILITY  Per UR Regulation:  Reviewed for med. necessity/level of care/duration of stay  If discussed at Long Length of Stay Meetings, dates discussed:    Comments:

## 2014-05-29 NOTE — Progress Notes (Signed)
Stopped in on PT at request of charge nurse. PT declined visit. I let her know that chaplains are available should she want or need a visit.  Wille Glaser Resident 05/29/2014 4:30 PM

## 2014-05-29 NOTE — Clinical Social Work Placement (Addendum)
Clinical Social Work Department CLINICAL SOCIAL WORK PLACEMENT NOTE 05/29/2014  Patient:  [name],Danielle  Account Number:  [id number] Admit date:  05/25/2014  Clinical Social Worker:  Lavell Luster  Date/time:  05/29/2014 09:22 PM  Clinical Social Work is seeking post-discharge placement for this patient at the following level of care:   SKILLED NURSING   (*CSW will update this form in Epic as items are completed)   05/29/2014  Patient/family provided with [facility] Department of Clinical Social Work's list of facilities offering this level of care within the geographic area requested by the patient (or if unable, by the patient's family).  05/29/2014  Patient/family informed of their freedom to choose among providers that offer the needed level of care, that participate in Medicare, Medicaid or managed care program needed by the patient, have an available bed and are willing to accept the patient.  05/29/2014  Patient/family informed of [facility]' ownership interest in [facility], as well as of the fact that they are under no obligation to receive care at this facility.  PASARR submitted to EDS on 05/29/2014 PASARR number received on 05/29/2014  FL2 transmitted to all facilities in geographic area requested by pt/family on  05/29/2014 FL2 transmitted to all facilities within larger geographic area on   Patient informed that his/her managed care company has contracts with or will negotiate with  certain facilities, including the following:     Patient/family informed of bed offers received:  05/30/2014 Patient chooses bed at [facility] Physician recommends and patient chooses bed at    Patient to be transferred to [facility]  on  05/30/2014 Patient to be transferred to facility by [facility] Patient and family notified of transfer on 05/30/2014 Name of family member notified:  Joni Reining (pt daughter)  The following  physician request were entered in Epic:   Additional Comments:   Roddie Mc MSW, [place], [place], [phone]  [place], [place] [phone]

## 2014-05-29 NOTE — Progress Notes (Signed)
Speech Language Pathology Treatment: Dysphagia  Patient Details Name: Danielle [name] MRN: [id number] DOB: 04-07-1946 Today's Date: 05/29/2014 Time: 8871-9597 SLP Time Calculation (min): 12 min  Assessment / Plan / Recommendation Clinical Impression  F/u after 9/4 swallow assessment.  Pt with improvements; able to consume all consistencies safely.  Mastication is slow, but functional. There are no overt s/s of aspiration, despite WOB.  Ventilatory/swallowing coordination is WNL.  Recommend advancing diet to regular, thin liquids.  No further SLP f/u is warranted.  Will sign off.    HPI HPI: 68 y.o. F brought to [facility] on 9/3 from [facility] [facility] assisted living with complaints of AMS and fever to 101.3.  Pt was transferred from [facility] to [facility] place earlier in the evening and apparently suffered a fall around 730 AM day of presentation.  She was cleared by a physician following fall.  In ED, pt was found to have a small ICH.  Neurology was consulted and [facility] to [facility].  PCCM was consulted for admission.   Pertinent Vitals Pain Assessment: No/denies pain  SLP Plan  All goals met    Recommendations Diet recommendations: Regular;Thin liquid Liquids provided via: Cup Medication Administration: Whole meds with liquid Supervision: Patient able to self feed (assist as needed) Compensations: Slow rate;Small sips/bites Postural Changes and/or Swallow Maneuvers: Seated upright 90 degrees              Oral Care Recommendations: Oral care BID Follow up Recommendations: None Plan: All goals met    GO     Danielle [name] 05/29/2014, 9:57 AM

## 2014-05-29 NOTE — Evaluation (Addendum)
Physical Therapy Evaluation Patient Details Name: Danielle [name] MRN: [id number] DOB: [birth year] Today's Date: 05/29/2014   History of Present Illness  Patient is a 68 y/o female brought to [facility] ED on 9/3 from [place] [facility] assisted living with complaints of AMS and fever of 101.3. Pt was transferred from [facility] to [facility] place earlier in the evening and apparently suffered a fall around 730 AM day of presentation. She was cleared by a physician following fall. In ED, pt was found to have a small ICH. Neurology was consulted and [facility] to [facility].  CT Head 9/3 - acute inferior frontal hemorrhage affecting the left gyrus rectus, roughly 9 x 19 mm, with a small halo of surrounding edema suggesting this bleed could be hours or even a few days old. CT head 9/4-19 x 9 mm hematoma in the left gyrus rectus is size stable. Surrounding edema has mildly increased.   Clinical Impression  Patient presents with functional limitations due to deficits listed in PT problem list (see below). Pt with impaired cognition and balance deficits limiting safe mobility. Pt with some mild memory deficits and mild impulsivity. Concerned about pt's safety awareness if to return home alone due to above. Pt recently d/ced from SNF prior to admission. Would benefit from skilled acute PT and follow up ST SNF to improve safety awareness, gait and overall mobility so pt can maximize independence, reduce fall risk and return to PLOF.    Follow Up Recommendations SNF;Supervision/Assistance - 24 hour    Equipment Recommendations  Other (comment) (defer to SNF.)    Recommendations for Other Services OT consult     Precautions / Restrictions Precautions Precautions: Fall Restrictions Weight Bearing Restrictions: No      Mobility  Bed Mobility               General bed mobility comments: Received sitting EOB upon arrival.   Transfers Overall transfer level: Needs assistance Equipment used: Rolling  walker (2 wheeled) Transfers: Sit to/from Stand Sit to Stand: Min guard         General transfer comment: Min guard for safety, unsteady initially upon standing.  Ambulation/Gait Ambulation/Gait assistance: Min guard Ambulation Distance (Feet): 150 Feet Assistive device: Rolling walker (2 wheeled) Gait Pattern/deviations: Step-through pattern;Decreased stride length;Trunk flexed;Drifts right/left Gait velocity: decreased   General Gait Details: VC for RW management and safety - pt bumping RW into objects in room/hall, able to problem solve with increased time. Unsteady. Dyspnea present. Sa02 >95% on RA post gait training.  Stairs            Wheelchair Mobility    Modified Rankin (Stroke Patients Only)       Balance Overall balance assessment: Needs assistance Sitting-balance support: No upper extremity supported;Feet supported Sitting balance-Leahy Scale: Fair     Standing balance support: During functional activity;Bilateral upper extremity supported Standing balance-Leahy Scale: Poor Standing balance comment: Requires BUE support on RW during dynamic standing due to cognitive deficits and balance. Able to stand for short periods statically without UE support.                             Pertinent Vitals/Pain Pain Assessment: 0-10 Pain Score:  (not rated on pain scale.) Pain Location: head and neck, "i  just don't feel good." Pain Descriptors / Indicators: Aching;Sore Pain Intervention(s): Monitored during session;Repositioned    Home Living Family/patient expects to be discharged to:: Skilled nursing facility  Additional Comments: Recently d/ced from SNF and transferred to ALF on 9/3, however only there 1 day prior to admission to hospital, s/p fall earlier in day at [facility].    Prior Function Level of Independence: Independent         Comments: Pt reports (I) with ADLs and IADLs PTA however pt in SNF per MD notes prior to  hospital admission. Not sure of accuracy of reported PLOF as pt confused today and perseverating on wanting her jacket, shoes and glasses and wants to find her room where these things are located.     Hand Dominance        Extremity/Trunk Assessment   Upper Extremity Assessment: Generalized weakness           Lower Extremity Assessment: Generalized weakness         Communication   Communication: No difficulties  Cognition Arousal/Alertness: Awake/alert Behavior During Therapy: WFL for tasks assessed/performed Overall Cognitive Status: No family/caregiver present to determine baseline cognitive functioning Area of Impairment: Orientation;Following commands;Safety/judgement;Problem solving Orientation Level: Disoriented to;Place;Situation     Following Commands: Follows one step commands consistently;Follows multi-step commands inconsistently Safety/Judgement: Decreased awareness of safety;Decreased awareness of deficits   Problem Solving: Slow processing;Requires verbal cues General Comments: Pt perseverating on wanting to find her room where all her belongings are located - her shoes, glasses, jacket. Not able to recognize her personal clothes in her room, "I don't know whose those are." Seems confused. Poor historian. Directable. Able to problem solve finding room using room numbers.     General Comments General comments (skin integrity, edema, etc.): Blisters on lips.     Exercises General Exercises - Lower Extremity Ankle Circles/Pumps: Both;10 reps;Seated Long Arc Quad: Both;5 reps;Seated      Assessment/Plan    PT Assessment Patient needs continued PT services  PT Diagnosis Generalized weakness   PT Problem List Decreased strength;Pain;Cardiopulmonary status limiting activity;Decreased cognition;Decreased knowledge of use of DME;Decreased activity tolerance;Decreased balance;Decreased safety awareness;Decreased mobility  PT Treatment Interventions DME  instruction;Balance training;Gait training;Neuromuscular re-education;Therapeutic exercise;Therapeutic activities;Functional mobility training;Patient/family education;Cognitive remediation   PT Goals (Current goals can be found in the Care Plan section) Acute Rehab PT Goals Patient Stated Goal: to find her stuff and go home. PT Goal Formulation: With patient Time For Goal Achievement: 06/12/14 Potential to Achieve Goals: Good    Frequency Min 3X/week   Barriers to discharge Decreased caregiver support Per MD notes, pt lives in ALF. alone?    Co-evaluation               End of Session Equipment Utilized During Treatment: Gait belt Activity Tolerance: Patient limited by fatigue Patient left: in chair;with call bell/phone within reach;with chair alarm set;with nursing/sitter in room Nurse Communication: Mobility status;Precautions         Time: 1020-1046 PT Time Calculation (min): 26 min   Charges:   PT Evaluation $Initial PT Evaluation Tier I: 1 Procedure PT Treatments $Gait Training: 8-22 mins   PT G CodesAlvie Heidelberg A 05/29/2014, 11:04 AM Alvie Heidelberg, PT, DPT [phone]

## 2014-05-30 MED ORDER — ACETAMINOPHEN 325 MG PO TABS
650.0000 mg | ORAL_TABLET | Freq: Four times a day (QID) | ORAL | Status: DC | PRN
  Administered 2014-05-30: 650 mg via ORAL
  Filled 2014-05-30: qty 2

## 2014-05-30 MED ORDER — METRONIDAZOLE 500 MG PO TABS: 500.0000 mg | ORAL_TABLET | Freq: Three times a day (TID) | ORAL | Status: DC

## 2014-05-30 MED ORDER — K PHOS MONO-SOD PHOS DI & MONO 155-852-130 MG PO TABS
500.0000 mg | ORAL_TABLET | Freq: Every day | ORAL | Status: DC
  Filled 2014-05-30: qty 2

## 2014-05-30 NOTE — Discharge Summary (Signed)
Physician Discharge Summary  Danielle [name] ZOX:[id number] DOB: September 09, 1946 DOA: 05/25/2014  PCP: No primary provider on file.  Admit date: 05/25/2014 Discharge date: 05/30/2014  Time spent: 353 minutes  Recommendations for Outpatient Follow-up:  1. Follow up with Dr. Pearlean Brownie  Discharge Diagnoses:  Active Problems:   CVA (cerebral vascular accident)   Intracerebral hemorrhage   ICH (intracerebral hemorrhage)   Hyponatremia   Acute encephalopathy   Sepsis   Discharge Condition: stable  Diet recommendation: heart healthy  Filed Weights   05/28/14 0500 05/29/14 0526 05/30/14 0444  Weight: 63.5 kg (139 lb 15.9 oz) 63.6 kg (140 lb 3.4 oz) 63.6 kg (140 lb 3.4 oz)    History of present illness:  68 y.o. F brought to [facility] on 9/3 from [facility] assisted [facility] with complaints of AMS and fever to 101.3. Pt was transferred from [facility] to [facility] earlier in the evening and apparently suffered a fall around 730 AM day of presentation. She was cleared by a physician following fall. In ED, pt was found to have a small ICH. Neurology was consulted and [facility]. PCCM was consulted for admission   Hospital Course:  ICH (intracerebral hemorrhage)/Acute encephalopathy:  - unclear etiology of ICH, pt was on plavix on admsision.  - neurology on board.  - no obvious infectious source.  - CT angio head Stable 1.9 x 0.9 x 0.8 cm hematoma inferior medial left frontal lobe with surrounding vasogenic edema.  Carotid Doppler negative 2D Echo unremarkable.  - PT rec SNF.   Hyponatremia/Hypokalemia:  - hypo osmolar/hyponatremia.  - Resolved with IV fluids.  - pre-renal etiology.   Most likely SIRS:  - Fever leukocytosis and procalcitonin acting like acute phase reactant to ICH.  - BCx2 9/3 UCx remained negative off antibiotics.   C. Dif colitis:  - c. Dif pcr positive - started flagyl for 14 days.    Procedures:  MRI  CT angio  CT head  CXR  Carotid  doppler  ECHO  Consultations:  neurology  Discharge Exam: Filed Vitals:   05/30/14 0444  BP: 161/63  Pulse: 96  Temp: 98.4 F (36.9 C)  Resp: 18    General: A&O x3 Cardiovascular: RRR Respiratory: good air movement CTA B/L  Discharge Instructions You were cared for by a hospitalist during your hospital stay. If you have any questions about your discharge medications or the care you received while you were in the hospital after you are discharged, you can call the unit and asked to speak with the hospitalist on call if the hospitalist that took care of you is not available. Once you are discharged, your primary care physician will handle any further medical issues. Please note that NO REFILLS for any discharge medications will be authorized once you are discharged, as it is imperative that you return to your primary care physician (or establish a relationship with a primary care physician if you do not have one) for your aftercare needs so that they can reassess your need for medications and monitor your lab values.  Discharge Instructions   Diet - low sodium heart healthy    Complete by:  As directed      Increase activity slowly    Complete by:  As directed           Current Discharge Medication List    START taking these medications   Details  metroNIDAZOLE (FLAGYL) 500 MG tablet Take 1 tablet (500 mg total) by mouth every 8 (  eight) hours. Qty: 26 tablet, Refills: 0      CONTINUE these medications which have NOT CHANGED   Details  atorvastatin (LIPITOR) 40 MG tablet Take 40 mg by mouth daily.    calcium carbonate (OS-CAL) 600 MG TABS tablet Take 600 mg by mouth 2 (two) times daily with a meal.    Fluticasone-Salmeterol (ADVAIR) 250-50 MCG/DOSE AEPB Inhale 1 puff into the lungs 2 (two) times daily.    iron polysaccharides (NIFEREX) 150 MG capsule Take 150 mg by mouth 2 (two) times daily.    loratadine (CLARITIN) 10 MG tablet Take 10 mg by mouth daily.     metoprolol tartrate (LOPRESSOR) 25 MG tablet Take 25 mg by mouth 2 (two) times daily.    mirtazapine (REMERON) 30 MG tablet Take 30 mg by mouth at bedtime.    Multiple Vitamin (MULTIVITAMIN WITH MINERALS) TABS tablet Take 1 tablet by mouth daily.    pantoprazole (PROTONIX) 40 MG tablet Take 40 mg by mouth 2 (two) times daily.    potassium chloride SA (K-DUR,KLOR-CON) 20 MEQ tablet Take 20 mEq by mouth daily.    sertraline (ZOLOFT) 50 MG tablet Take 50 mg by mouth daily.    Thiamine Mononitrate (VITAMIN B1 PO) Take 1 tablet by mouth daily.    zinc oxide 20 % ointment Apply 1 application topically as needed for irritation.      STOP taking these medications     clopidogrel (PLAVIX) 75 MG tablet        No Known Allergies    The results of significant diagnostics from this hospitalization (including imaging, microbiology, ancillary and laboratory) are listed below for reference.    Significant Diagnostic Studies: Ct Angio Head W/cm &/or Wo Cm  05/27/2014   CLINICAL DATA:  68 year old female with history multiple infarcts. Hyperlipidemia. Follow-up intracranial hemorrhage. Encephalopathy.  EXAM: CT ANGIOGRAPHY HEAD  TECHNIQUE: Multidetector CT imaging of the head was performed using the standard protocol during bolus administration of intravenous contrast. Multiplanar CT image reconstructions and MIPs were obtained to evaluate the vascular anatomy.  CONTRAST:  50mL OMNIPAQUE IOHEXOL 350 MG/ML SOLN  COMPARISON:  05/26/2014 and 05/25/2014 head CT. 12/23/2013 brain MR.  FINDINGS: Stable 1.9 x 0.9 x 0.8 cm hematoma inferior medial left frontal lobe with surrounding vasogenic edema.  Prominent vessel along the undersurface of the hematoma may represent slow flow within an engorged vein versus result of small dural fistula.  Remote bilateral basal ganglia infarcts. Remote infarct anterior right thalamus.  Small vessel disease type changes.  Calcified plaque cavernous segment of the internal  carotid artery bilaterally with moderate narrowing on the right and moderate to marked narrowing on the left.  Mild narrowing and irregularity of the M1 segment and A1 segment of the anterior cerebral artery bilaterally.  Fetal type contribution to the posterior cerebral artery.  Right vertebral artery is dominant. Narrowing of the distal vertebral arteries. Attenuated basilar artery partially explained by the fetal type circulation but also with findings suggestive of superimposed atherosclerotic type changes.  No intracranial enhancing lesion separate from the above described findings.  Review of the MIP images confirms the above findings.  IMPRESSION: Stable 1.9 x 0.9 x 0.8 cm hematoma inferior medial left frontal lobe with surrounding vasogenic edema.  Prominent vessel along the undersurface of the hematoma may represent slow flow within an engorged vein versus result of small dural fistula.  Remote infarcts, small vessel disease type changes and intracranial atherosclerotic disease (most notable cavernous segment internal carotid arteries) as detailed  above.   Electronically Signed   By: Bridgett Larsson M.D.   On: 05/27/2014 16:24   Dg Chest 1 View  05/25/2014   CLINICAL DATA:  Fever, altered mental status  EXAM: CHEST - 1 VIEW  COMPARISON:  Chest radiograph 12/28/2013  FINDINGS: Multiple leads overlie the patient. Stable cardiac and mediastinal contours. No consolidative pulmonary opacities. Stable calcified granuloma left mid lung. No pleural effusion pneumothorax.  IMPRESSION: No acute cardiopulmonary process.   Electronically Signed   By: Annia Belt M.D.   On: 05/25/2014 20:02   Ct Head Wo Contrast  05/26/2014   CLINICAL DATA:  Rule out hematoma expansion  EXAM: CT HEAD WITHOUT CONTRAST  TECHNIQUE: Contiguous axial images were obtained from the base of the skull through the vertex without intravenous contrast.  COMPARISON:  05/25/2014  FINDINGS: Even when accounting for repeat imaging, there is motion  limitation, resulting in artifact at the mid portion of the scan.  The inferior, parasagittal left frontal lobe parenchymal hemorrhage is size stable at 19 mm in length by 9 mm in width. Surrounding edema has mildly increased, with local mass effect. No interval subarachnoid extension. No new hemorrhage.  Focal mild thickening at the level of the mid falx appears stable from previous, arguing against trace subdural hemorrhage.  Remote lacunar infarcts in the right thalamus, right putamen, and periatrial white matter on the left. No evidence of shift, herniation, or acute infarct.  IMPRESSION: 19 x 9 mm hematoma in the left gyrus rectus is size stable. Surrounding edema has mildly increased.   Electronically Signed   By: Tiburcio Pea M.D.   On: 05/26/2014 12:40   Ct Head Wo Contrast  05/25/2014   CLINICAL DATA:  Fever, altered mental status, combative.  EXAM: CT HEAD WITHOUT CONTRAST  TECHNIQUE: Contiguous axial images were obtained from the base of the skull through the vertex without contrast.  COMPARISON:  CT head 12/28/2013.  CT head 07/01/2010.  FINDINGS: Generalized atrophy. Chronic microvascular ischemic change. Remote areas of lacunar cerebral infarction are redemonstrated similar in distribution to prior MR.  There is an acute inferior frontal hemorrhage affecting the LEFT gyrus rectus. This measures roughly 9 x 19 mm cross-section as a small halo of surrounding edema. No features to suggest vasogenic edema. No subfrontal masses are evident. There is no visible adjacent subarachnoid blood. There is advanced vascular calcification in the LEFT carotid siphon but no suggestion of a berry aneurysm. I do not see a skull fracture or significant scalp hematoma. There is no sinus air-fluid level to suggest hemorrhage or sinus opacities to suggest inflammation. Other than trauma, with a parenchymal hematoma, a hyperdense metastasis could have this appearance but is not felt likely. The inferior frontal region is  an unusual location for lobar hypertensive bleed.  Compared with prior scans, no abnormality was seen in this level previously.  IMPRESSION: Acute LEFT inferior frontal hemorrhage affecting the LEFT gyrus rectus, roughly 9 x 19 mm cross-section, with a small halo of surrounding edema suggesting this bleed could be hours or even a few days old. No visible subarachnoid blood, skull fracture, or sinus air-fluid level. No similar abnormality on prior CT scans.  Chronic changes as described with generalized atrophy, small vessel disease, and numerous areas of remote infarction.  Critical Value/emergent results were called by telephone at the time of interpretation on 05/25/2014 at 9:33 pm to Lorre Nick, MD, who verbally acknowledged these results.   Electronically Signed   By: Unice Bailey.D.  On: 05/25/2014 21:37   Dg Chest Port 1 View  05/28/2014   CLINICAL DATA:  Respiratory distress.  Productive cough.  EXAM: PORTABLE CHEST - 1 VIEW  COMPARISON:  05/27/2014  FINDINGS: Normal heart size and pulmonary vascularity. No focal airspace disease or consolidation in the lungs. No blunting of costophrenic angles. No pneumothorax. Mediastinal contours appear intact. Degenerative changes in the shoulders.  IMPRESSION: No active disease.   Electronically Signed   By: Burman Nieves M.D.   On: 05/28/2014 00:47   Dg Chest Port 1 View  05/27/2014   CLINICAL DATA:  Assess edema  EXAM: PORTABLE CHEST - 1 VIEW  COMPARISON:  05/25/2014  FINDINGS: Normal heart size.  Clear lungs.  No pneumothorax.  IMPRESSION: No active disease.   Electronically Signed   By: Maryclare Bean M.D.   On: 05/27/2014 07:13    Microbiology: Recent Results (from the past 240 hour(s))  CULTURE, BLOOD (ROUTINE X 2)     Status: None   Collection Time    05/25/14  6:32 PM      Result Value Ref Range Status   Specimen Description BLOOD LEFT FOREARM   Final   Special Requests BOTTLES DRAWN AEROBIC AND ANAEROBIC 5 ML   Final   Culture  Setup Time      Final   Value: 05/26/2014 00:36     Performed at [facility]   Culture     Final   Value:        BLOOD CULTURE RECEIVED NO GROWTH TO DATE CULTURE WILL BE HELD FOR 5 DAYS BEFORE ISSUING A FINAL NEGATIVE REPORT     Performed at [facility]   Report Status PENDING   Incomplete  CULTURE, BLOOD (ROUTINE X 2)     Status: None   Collection Time    05/25/14  6:32 PM      Result Value Ref Range Status   Specimen Description BLOOD LEFT ANTECUBITAL   Final   Special Requests BOTTLES DRAWN AEROBIC AND ANAEROBIC   Final   Culture  Setup Time     Final   Value: 05/25/2014 23:05     Performed at [facility]   Culture     Final   Value:        BLOOD CULTURE RECEIVED NO GROWTH TO DATE CULTURE WILL BE HELD FOR 5 DAYS BEFORE ISSUING A FINAL NEGATIVE REPORT     Performed at [facility]   Report Status PENDING   Incomplete  URINE CULTURE     Status: None   Collection Time    05/25/14  6:55 PM      Result Value Ref Range Status   Specimen Description URINE, RANDOM   Final   Special Requests NONE   Final   Culture  Setup Time     Final   Value: 05/26/2014 01:15     Performed at [facility] Count     Final   Value: NO GROWTH     Performed at [facility]   Culture     Final   Value: NO GROWTH     Performed at [facility]   Report Status 05/27/2014 FINAL   Final  MRSA PCR SCREENING     Status: None   Collection Time    05/26/14 12:58 AM      Result Value Ref Range Status   MRSA by PCR NEGATIVE  NEGATIVE Final   Comment:  The GeneXpert MRSA Assay (FDA     approved for NASAL specimens     only), is one component of a     comprehensive MRSA colonization     surveillance program. It is not     intended to diagnose MRSA     infection nor to guide or     monitor treatment for     MRSA infections.  CLOSTRIDIUM DIFFICILE BY PCR     Status: Abnormal   Collection Time    05/27/14 10:55 PM      Result Value Ref  Range Status   C difficile by pcr POSITIVE (*) NEGATIVE Final   Comment: CRITICAL RESULT CALLED TO, READ BACK BY AND VERIFIED WITH:     JUDY KOME RN AT 0943 05/28/14 BY WOOLLENK     Labs: Basic Metabolic Panel:  Recent Labs Lab 05/25/14 1832 05/26/14 0215 05/27/14 0320 05/28/14 1013  NA 122* 126* 136* 135*  K 3.3* 3.3* 2.8* 3.9  CL 78* 90* 101 103  CO2 GLUCOSE 113* 81 64* 129*  BUN CREATININE 0.78 0.61 0.45* 0.39*  CALCIUM 9.9 8.1* 7.9* 7.9*  MG  --  1.2*  --   --   PHOS  --  3.1  --  1.2*   Liver Function Tests:  Recent Labs Lab 05/25/14 1832 05/27/14 0320 05/28/14 1013  AST 36 33  --   ALT 20 22  --   ALKPHOS 87 74  --   BILITOT 1.0 0.6  --   PROT 7.5 5.7*  --   ALBUMIN 3.4* 2.3* 2.2*   No results found for this basename: LIPASE, AMYLASE,  in the last 168 hours  Recent Labs Lab 05/25/14 2050  AMMONIA 22   CBC:  Recent Labs Lab 05/25/14 1832 05/26/14 0215 05/27/14 0320  WBC 20.1* 16.2* 12.9*  NEUTROABS 18.4*  --  11.1*  HGB 12.6 10.3* 9.8*  HCT 35.0* 29.9* 28.8*  MCV 81.6 82.6 86.2  PLT 480* 368 359   Cardiac Enzymes: No results found for this basename: CKTOTAL, CKMB, CKMBINDEX, TROPONINI,  in the last 168 hours BNP: BNP (last 3 results) No results found for this basename: PROBNP,  in the last 8760 hours CBG: No results found for this basename: GLUCAP,  in the last 168 hours     Signed:  Marinda Elk  [facility] 05/30/2014, 9:33 AM

## 2014-05-30 NOTE — Discharge Instructions (Signed)
STROKE/TIA DISCHARGE INSTRUCTIONS SMOKING Cigarette smoking nearly doubles your risk of having a stroke & is the single most alterable risk factor  If you smoke or have smoked in the last 12 months, you are advised to quit smoking for your health.  Most of the excess cardiovascular risk related to smoking disappears within a year of stopping.  Ask you doctor about anti-smoking medications   [place] Quit Line: 1-800-QUIT NOW  Free Smoking Cessation Classes [phone]  CHOLESTEROL Know your levels; limit fat & cholesterol in your diet  Lipid Panel     Component Value Date/Time   CHOL 104 05/27/2014 0320   TRIG 88 05/27/2014 0320   HDL 19* 05/27/2014 0320   CHOLHDL 5.5 05/27/2014 0320   VLDL 18 05/27/2014 0320   LDLCALC 67 05/27/2014 0320      Many patients benefit from treatment even if their cholesterol is at goal.  Goal: Total Cholesterol (CHOL) less than 160  Goal:  Triglycerides (TRIG) less than 150  Goal:  HDL greater than 40  Goal:  LDL (LDLCALC) less than 100   BLOOD PRESSURE American Stroke Association blood pressure target is less that 120/80 mm/Hg  Your discharge blood pressure is:  BP: 121/73 mmHg  Monitor your blood pressure  Limit your salt and alcohol intake  Many individuals will require more than one medication for high blood pressure  DIABETES (A1c is a blood sugar average for last 3 months) Goal HGBA1c is under 7% (HBGA1c is blood sugar average for last 3 months)  Diabetes: No known diagnosis of diabetes    Lab Results  Component Value Date   HGBA1C 5.6 05/27/2014     Your HGBA1c can be lowered with medications, healthy diet, and exercise.  Check your blood sugar as directed by your physician  Call your physician if you experience unexplained or low blood sugars.  PHYSICAL ACTIVITY/REHABILITATION Goal is 30 minutes at least 4 days per week  Activity: Walk with assistance, Therapies: Physical Therapy: Nursing Facility and Occupational Therapy: Nursing  Facility Return to work: N/A  Activity decreases your risk of heart attack and stroke and makes your heart stronger.  It helps control your weight and blood pressure; helps you relax and can improve your mood.  Participate in a regular exercise program.  Talk with your doctor about the best form of exercise for you (dancing, walking, swimming, cycling).  DIET/WEIGHT Goal is to maintain a healthy weight  Your discharge diet is: General  liquids Your height is:  Height:  (157.5 cm) Your current weight is: Weight: 63.6 kg (140 lb 3.4 oz) Your Body Mass Index (BMI) is:  BMI (Calculated): 25.1  Following the type of diet specifically designed for you will help prevent another stroke.  Your goal weight range is:    Your goal Body Mass Index (BMI) is 19-24.  Healthy food habits can help reduce 3 risk factors for stroke:  High cholesterol, hypertension, and excess weight.  RESOURCES Stroke/Support Group:  Call [phone]   STROKE EDUCATION PROVIDED/REVIEWED AND GIVEN TO PATIENT Stroke warning signs and symptoms How to activate emergency medical system (call 911). Medications prescribed at discharge. Need for follow-up after discharge. Personal risk factors for stroke. Pneumonia vaccine given: No Flu vaccine given: No My questions have been answered, the writing is legible, and I understand these instructions.  I will adhere to these goals & educational materials that have been provided to me after my discharge from the hospital.

## 2014-05-30 NOTE — Progress Notes (Signed)
CSW (Clinical Child psychotherapist) spoke with [facility] and they confirmed they can accept pt back today. CSW called and spoke with pt daughter who accepted bed offer and agreeable for dc to facility by non-emergent ambulance at 1:30pm. CSW prepared pt dc packet and placed with shadow chart. CSW arranged non-emergent ambulance transport. Pt, pt family, pt nurse, and facility informed. CSW signing off.  Randon Somera, LCSWA [phone]

## 2014-05-30 NOTE — Progress Notes (Signed)
Physical Therapy Treatment Patient Details Name: Danielle [name] MRN: [id number] DOB: 1946-04-16 Today's Date: 05/30/2014    History of Present Illness Patient is a 68 y/o female brought to [facility] ED on 9/3 from [facility] with complaints of AMS and fever of 101.3. Pt was transferred from [facility] to [facility] place earlier in the evening and apparently suffered a fall around 730 AM day of presentation. She was cleared by a physician following fall. In ED, pt was found to have a small ICH. Neurology was consulted and [facility]  CT Head 9/3 - acute inferior frontal hemorrhage affecting the left gyrus rectus, roughly 9 x 19 mm, with a small halo of surrounding edema suggesting this bleed could be hours or even a few days old. CT head 9/4-19 x 9 mm hematoma in the left gyrus rectus is size stable. Surrounding edema has mildly increased    PT Comments    Pt pleasant with flat affect and impulsive with mobility varied with decreased initiation. Pt with decresed initiation on command but will stand in room unassisted despite cueing and education. Pt with continued gait and balance deficits and will continue to follow to maximize function.   Follow Up Recommendations  SNF;Supervision/Assistance - 24 hour     Equipment Recommendations       Recommendations for Other Services       Precautions / Restrictions Precautions Precautions: Fall    Mobility  Bed Mobility               General bed mobility comments: in recliner on arrival  Transfers Overall transfer level: Needs assistance   Transfers: Sit to/from Stand Sit to Stand: Min assist         General transfer comment: min assist to stand from recliner with cues for sequence, minguard from toilet with rail and cues  Ambulation/Gait Ambulation/Gait assistance: Min guard Ambulation Distance (Feet): 175 Feet Assistive device: Rolling walker (2 wheeled) Gait Pattern/deviations: Step-through  pattern;Decreased stride length;Trunk flexed   Gait velocity interpretation: Below normal speed for age/gender General Gait Details: Pt attempting to walk in room without DME and unsteady with reaching for environmental support with shuffle gait. Improved stability with ambulating in RW with cues for posture and position in RW   Stairs            Wheelchair Mobility    Modified Rankin (Stroke Patients Only)       Balance Overall balance assessment: Needs assistance   Sitting balance-Leahy Scale: Fair       Standing balance-Leahy Scale: Poor                      Cognition Arousal/Alertness: Awake/alert Behavior During Therapy: Flat affect Overall Cognitive Status: No family/caregiver present to determine baseline cognitive functioning Area of Impairment: Safety/judgement;Awareness         Safety/Judgement: Decreased awareness of safety;Decreased awareness of deficits   Problem Solving: Slow processing;Decreased initiation      Exercises General Exercises - Lower Extremity Long Arc Quad: AROM;Seated;Both;15 reps Hip Flexion/Marching: AROM;Seated;Both;10 reps    General Comments        Pertinent Vitals/Pain Pain Location: " I dont' feel very good, I want dramamine" Pain Intervention(s): Repositioned;Patient requesting pain meds-RN notified    Home Living                      Prior Function  PT Goals (current goals can now be found in the care plan section) Progress towards PT goals: Progressing toward goals    Frequency       PT Plan Current plan remains appropriate    Co-evaluation             End of Session Equipment Utilized During Treatment: Gait belt Activity Tolerance: Patient limited by fatigue Patient left: in chair;with call bell/phone within reach;with chair alarm set     Time: 1610-9604 PT Time Calculation (min): 25 min  Charges:  $Gait Training: 8-22 mins $Therapeutic Exercise: 8-22 mins                     G Codes:      Delorse Lek 06-29-14, 10:58 AM Delaney Meigs, PT [phone]

## 2014-05-30 NOTE — Progress Notes (Signed)
CSW (Clinical Child psychotherapist) checked bed offers and at this time no facility has made a bed offer. Some facilities have requested additional information, CSW called these facilities and answered all questions. Awaiting return phone calls with decisions on bed offers.  Trevian Hayashida, LCSWA [phone]

## 2014-05-31 LAB — CULTURE, BLOOD (ROUTINE X 2): CULTURE: NO GROWTH

## 2014-06-01 LAB — CULTURE, BLOOD (ROUTINE X 2): Culture: NO GROWTH

## 2016-02-14 ENCOUNTER — Encounter (HOSPITAL_COMMUNITY): Payer: Self-pay | Admitting: Emergency Medicine

## 2016-02-14 ENCOUNTER — Emergency Department (HOSPITAL_COMMUNITY): Payer: Medicare Other

## 2016-02-14 ENCOUNTER — Emergency Department (HOSPITAL_COMMUNITY)
Admission: EM | Admit: 2016-02-14 | Discharge: 2016-02-14 | Disposition: A | Discharge status: Home / Self Care | Payer: Medicare Other | Attending: Emergency Medicine | Admitting: Emergency Medicine

## 2016-02-14 DIAGNOSIS — Y929 Unspecified place or not applicable: Secondary | ICD-10-CM | POA: Diagnosis not present

## 2016-02-14 DIAGNOSIS — Y999 Unspecified external cause status: Secondary | ICD-10-CM | POA: Diagnosis not present

## 2016-02-14 DIAGNOSIS — M1990 Unspecified osteoarthritis, unspecified site: Secondary | ICD-10-CM | POA: Insufficient documentation

## 2016-02-14 DIAGNOSIS — S0181XA Laceration without foreign body of other part of head, initial encounter: Secondary | ICD-10-CM | POA: Insufficient documentation

## 2016-02-14 DIAGNOSIS — W01198A Fall on same level from slipping, tripping and stumbling with subsequent striking against other object, initial encounter: Secondary | ICD-10-CM | POA: Insufficient documentation

## 2016-02-14 DIAGNOSIS — F329 Major depressive disorder, single episode, unspecified: Secondary | ICD-10-CM | POA: Insufficient documentation

## 2016-02-14 DIAGNOSIS — F17210 Nicotine dependence, cigarettes, uncomplicated: Secondary | ICD-10-CM | POA: Insufficient documentation

## 2016-02-14 DIAGNOSIS — N390 Urinary tract infection, site not specified: Secondary | ICD-10-CM | POA: Insufficient documentation

## 2016-02-14 DIAGNOSIS — W19XXXA Unspecified fall, initial encounter: Secondary | ICD-10-CM

## 2016-02-14 DIAGNOSIS — Z79899 Other long term (current) drug therapy: Secondary | ICD-10-CM | POA: Diagnosis not present

## 2016-02-14 DIAGNOSIS — J45909 Unspecified asthma, uncomplicated: Secondary | ICD-10-CM | POA: Insufficient documentation

## 2016-02-14 DIAGNOSIS — Y939 Activity, unspecified: Secondary | ICD-10-CM | POA: Insufficient documentation

## 2016-02-14 DIAGNOSIS — R42 Dizziness and giddiness: Secondary | ICD-10-CM | POA: Diagnosis not present

## 2016-02-14 LAB — URINE MICROSCOPIC-ADD ON
RBC / HPF: NONE SEEN RBC/hpf (ref 0–5)
Squamous Epithelial / LPF: NONE SEEN

## 2016-02-14 LAB — CBC WITH DIFFERENTIAL/PLATELET
Basophils Absolute: 0 10*3/uL (ref 0.0–0.1)
Basophils Relative: 0 %
EOS ABS: 0.2 10*3/uL (ref 0.0–0.7)
EOS PCT: 2 %
HEMATOCRIT: 36.9 % (ref 36.0–46.0)
Hemoglobin: 11.9 g/dL — ABNORMAL LOW (ref 12.0–15.0)
LYMPHS ABS: 2 10*3/uL (ref 0.7–4.0)
Lymphocytes Relative: 21 %
MCH: 30.1 pg (ref 26.0–34.0)
MCHC: 32.2 g/dL (ref 30.0–36.0)
MCV: 93.4 fL (ref 78.0–100.0)
MONO ABS: 0.8 10*3/uL (ref 0.1–1.0)
MONOS PCT: 8 %
Neutro Abs: 6.3 10*3/uL (ref 1.7–7.7)
Neutrophils Relative %: 69 %
Platelets: 255 10*3/uL (ref 150–400)
RBC: 3.95 MIL/uL (ref 3.87–5.11)
RDW: 14.3 % (ref 11.5–15.5)
WBC: 9.2 10*3/uL (ref 4.0–10.5)

## 2016-02-14 LAB — URINALYSIS, ROUTINE W REFLEX MICROSCOPIC
Bilirubin Urine: NEGATIVE
GLUCOSE, UA: NEGATIVE mg/dL
Ketones, ur: NEGATIVE mg/dL
Nitrite: POSITIVE — AB
PROTEIN: NEGATIVE mg/dL
SPECIFIC GRAVITY, URINE: 1.009 (ref 1.005–1.030)
pH: 5.5 (ref 5.0–8.0)

## 2016-02-14 LAB — BASIC METABOLIC PANEL
Anion gap: 5 (ref 5–15)
BUN: 12 mg/dL (ref 6–20)
CHLORIDE: 103 mmol/L (ref 101–111)
CO2: 30 mmol/L (ref 22–32)
CREATININE: 0.77 mg/dL (ref 0.44–1.00)
Calcium: 9.6 mg/dL (ref 8.9–10.3)
GFR calc Af Amer: >60 mL/min (ref >60)
GFR calc non Af Amer: >60 mL/min (ref >60)
GLUCOSE: 96 mg/dL (ref 65–99)
POTASSIUM: 3.8 mmol/L (ref 3.5–5.1)
SODIUM: 138 mmol/L (ref 135–145)

## 2016-02-14 LAB — TROPONIN I

## 2016-02-14 MED ORDER — CEPHALEXIN 500 MG PO CAPS: 1000.0000 mg | ORAL_CAPSULE | Freq: Two times a day (BID) | ORAL | Status: DC

## 2016-02-14 MED ORDER — CEPHALEXIN 500 MG PO CAPS
1000.0000 mg | ORAL_CAPSULE | Freq: Once | ORAL | Status: AC
  Administered 2016-02-14: 1000 mg via ORAL
  Filled 2016-02-14: qty 2

## 2016-02-14 MED ORDER — CEFTRIAXONE SODIUM 1 G IJ SOLR
1.0000 g | Freq: Once | INTRAMUSCULAR | Status: DC
  Filled 2016-02-14: qty 10

## 2016-02-14 NOTE — ED Notes (Signed)
Patient transported to MRI 

## 2016-02-14 NOTE — ED Provider Notes (Signed)
Patient's MRI has returned with no acute findings. She is alert and following commands appropriately. Vital signs are stable. She continues to feel slightly weak but all motor function is intact. The patient has ambulated at this time [facility].  Arby BarretteMarcy Anahla Bevis, MD 02/14/16 1047

## 2016-02-14 NOTE — ED Provider Notes (Signed)
CSN: [id number]     Arrival date & time 02/14/16  4098 History  By signing my name below, I, [facility], attest that this documentation has been prepared under the direction and in the presence of Derwood Kaplan, MD. Electronically Signed: Randell Patient, ED Scribe. 02/14/2016. 4:08 AM.   Chief Complaint  Patient presents with  . Fall  . Head Laceration    The history is provided by the patient. No language interpreter was used.  HPI Comments: Danielle [name] is a 70 y.o. female who presents to the Emergency Department complaining of a small, not actively bleeding head laceration to her right temple that occurred after a fall shortly PTA. Nurse states that the pt comes from [facility] and tripped on the way to the bathroom, which caused her to fall and strike her head on a hard object but is unsure what she struck her head on. She reports dizziness, nausea, back pain and slowed, difficult speech after the fall. 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 Denies blood thinner use. Denies LOC, HA, neck pain, neck stiffness, abdominal pain, and any other symptoms currently.  Past Medical History  Diagnosis Date  . Encephalopathy   . Hyposmolality and/or hyponatremia   . GIB (gastrointestinal bleeding)   . Anemia   . Chronic airway obstruction (HCC)   . Hypopotassemia   . Depression   . Anxiety   . Esophagitis   . Asthma   . Arthritis     "some; all over" (05/29/2014)   Past Surgical History  Procedure Laterality Date  . Tonsillectomy    . Appendectomy    . Cholecystectomy    . Abdominal hysterectomy    . Tubal ligation     History reviewed. No pertinent family history. Social History  Substance Use Topics  . Smoking status: Current Every Day Smoker -- 0.25 packs/day for 50 years    Types: Cigarettes  . Smokeless tobacco: Never Used  . Alcohol Use: No   OB History    No data available     Review of Systems A complete 10 system  review of systems was obtained and all systems are negative except as noted in the HPI and PMH.    Allergies  Review of patient's allergies indicates no known allergies.  Home Medications   Prior to Admission medications   Medication Sig Start Date End Date Taking? Authorizing Provider  albuterol (PROVENTIL HFA;VENTOLIN HFA) 108 (90 Base) MCG/ACT inhaler Inhale 1 puff into the lungs every 6 (six) hours as needed for wheezing or shortness of breath.   Yes Historical Provider, MD  atorvastatin (LIPITOR) 40 MG tablet Take 40 mg by mouth daily.   Yes Historical Provider, MD  budesonide-formoterol (SYMBICORT) 160-4.5 MCG/ACT inhaler Inhale 2 puffs into the lungs 2 (two) times daily.   Yes Historical Provider, MD  busPIRone (BUSPAR) 10 MG tablet Take 10 mg by mouth 2 (two) times daily.   Yes Historical Provider, MD  Dextromethorphan-Guaifenesin (ROBAFEN DM) 10-100 MG/5ML liquid Take 5 mLs by mouth every 4 (four) hours as needed (COUGH/CONGESTION).   Yes Historical Provider, MD  diazepam (VALIUM) 5 MG tablet Take 5 mg by mouth 2 (two) times daily.   Yes Historical Provider, MD  HYDROcodone-acetaminophen (NORCO) 7.5-325 MG tablet Take 1 tablet by mouth every 6 (six) hours as needed for moderate pain or severe pain.   Yes Historical Provider, MD  iron polysaccharides (NIFEREX) 150 MG capsule Take 150 mg by mouth daily.  Yes Historical Provider, MD  loperamide (IMODIUM) 2 MG capsule Take 2 mg by mouth 4 (four) times daily as needed for diarrhea or loose stools.   Yes Historical Provider, MD  metoprolol succinate (TOPROL-XL) 100 MG 24 hr tablet Take 100 mg by mouth daily. Take with or immediately following a meal.   Yes Historical Provider, MD  mirtazapine (REMERON) 7.5 MG tablet Take 7.5 mg by mouth at bedtime.   Yes Historical Provider, MD  pantoprazole (PROTONIX) 40 MG tablet Take 40 mg by mouth daily.    Yes Historical Provider, MD  Potassium Gluconate 2 MEQ TABS Take 2 mEq by mouth daily.   Yes  Historical Provider, MD  QUEtiapine (SEROQUEL) 50 MG tablet Take 50 mg by mouth 2 (two) times daily.   Yes Historical Provider, MD  sertraline (ZOLOFT) 100 MG tablet Take 200 mg by mouth daily.   Yes Historical Provider, MD  tiotropium (SPIRIVA) 18 MCG inhalation capsule Place 18 mcg into inhaler and inhale daily.   Yes Historical Provider, MD  vitamin B-12 (CYANOCOBALAMIN) 500 MCG tablet Take 500 mcg by mouth daily.   Yes Historical Provider, MD  cephALEXin (KEFLEX) 500 MG capsule Take 2 capsules (1,000 mg total) by mouth 2 (two) times daily. 02/14/16   Arby Barrette, MD   BP 179/88 mmHg  Pulse 57  Temp(Src) 97.5 F (36.4 C) (Oral)  Resp 22  SpO2 97% Physical Exam  Constitutional: She is oriented to person, place, and time. She appears well-developed and well-nourished. No distress.  HENT:  Head: Normocephalic and atraumatic.  Stellate type laceration to right temporal region. No active bleeding.  Eyes: Conjunctivae are normal. Pupils are equal, round, and reactive to light.  Pupils are 4 mm, equal, and reactive to light. Horizontal and vertical nystagmus.  Neck: Normal range of motion.  Cardiovascular: Normal rate and regular rhythm.   Pulmonary/Chest: Effort normal. No respiratory distress. She has wheezes.  Generalized wheezing.  Musculoskeletal: Normal range of motion.  Neurological: She is alert and oriented to person, place, and time.  Cranial nerves II-XII intact. Slight dysmetria with left finger-to-nose exam. On sensory exam, normal sensation of BUE and BLE. 4+/5 BUE and BLE strength.  Skin: Skin is warm and dry.  Psychiatric: She has a normal mood and affect. Her behavior is normal.  Nursing note and vitals reviewed.   ED Course  Procedures (including critical care time)   COORDINATION OF CARE: 3:56 AM Discussed treatment plan with pt at bedside and pt agreed to plan.   Labs Review Labs Reviewed  CBC WITH DIFFERENTIAL/PLATELET - Abnormal; Notable for the  following:    Hemoglobin 11.9 (*)    All other components within normal limits  URINALYSIS, ROUTINE W REFLEX MICROSCOPIC (NOT AT [facility]) - Abnormal; Notable for the following:    APPearance CLOUDY (*)    Hgb urine dipstick TRACE (*)    Nitrite POSITIVE (*)    Leukocytes, UA MODERATE (*)    All other components within normal limits  URINE MICROSCOPIC-ADD ON - Abnormal; Notable for the following:    Bacteria, UA MANY (*)    All other components within normal limits  BASIC METABOLIC PANEL  TROPONIN I    Imaging Review No results found. I have personally reviewed and evaluated these images and lab results as part of my medical decision-making.   EKG Interpretation   Date/Time:  Thursday Feb 14 2016 04:39:12 EDT Ventricular Rate:  53 PR Interval:  187 QRS Duration: 88 QT Interval:  447  QTC Calculation: 420 R Axis:   -16 Text Interpretation:  Sinus rhythm Borderline left axis deviation  Confirmed by ZAVITZ MD, JOSHUA [phone]([id number]) on 02/15/2016 9:08:50 PM      MDM   Final diagnoses:  Fall, initial encounter  UTI (lower urinary tract infection)    I personally performed the services described in this documentation, which was scribed in my presence. The recorded information has been reviewed and is accurate.  Pt comes in with cc of fall. Fall on coumadin with bruise to the head. No red flags on hx or exam to be concerned for acute life threatening head bleed, but she will need CT head to r/o SDH. [facility], [facility] Blinda LeatherwoodPollina[facility]    Derwood KaplanAnkit Naiyana Barbian, MD 02/22/16 [phone]

## 2016-02-14 NOTE — ED Notes (Signed)
[facility] Patient fell around 0245 while getting up to go to bathroom.  Patient passed SCCA scale and no LOC per EMS. Patient obtained Rt temporal head laceration. Patient is a/o. EMS reports patient is not on bld thinners.

## 2016-02-14 NOTE — ED Notes (Signed)
Patient ambulated in room and out into hallway with assistance. Patient drowsy and somewhat unsteady.

## 2016-02-14 NOTE — Discharge Instructions (Signed)
Head Injury, Adult °You have received a head injury. It does not appear serious at this time. Headaches and vomiting are common following head injury. It should be easy to awaken from sleeping. Sometimes it is necessary for you to stay in the emergency department for a while for observation. Sometimes admission to the hospital may be needed. After injuries such as yours, most problems occur within the first 24 hours, but side effects may occur up to 7-10 days after the injury. It is important for you to carefully monitor your condition and contact your health care provider or seek immediate medical care if there is a change in your condition. °WHAT ARE THE TYPES OF HEAD INJURIES? °Head injuries can be as minor as a bump. Some head injuries can be more severe. More severe head injuries include: °· A jarring injury to the brain (concussion). °· A bruise of the brain (contusion). This mean there is bleeding in the brain that can cause swelling. °· A cracked skull (skull fracture). °· Bleeding in the brain that collects, clots, and forms a bump (hematoma). °WHAT CAUSES A HEAD INJURY? °A serious head injury is most likely to happen to someone who is in a car wreck and is not wearing a seat belt. Other causes of major head injuries include bicycle or motorcycle accidents, sports injuries, and falls. °HOW ARE HEAD INJURIES DIAGNOSED? °A complete history of the event leading to the injury and your current symptoms will be helpful in diagnosing head injuries. Many times, pictures of the brain, such as CT or MRI are needed to see the extent of the injury. Often, an overnight hospital stay is necessary for observation.  °WHEN SHOULD I SEEK IMMEDIATE MEDICAL CARE?  °You should get help right away if: °· You have confusion or drowsiness. °· You feel sick to your stomach (nauseous) or have continued, forceful vomiting. °· You have dizziness or unsteadiness that is getting worse. °· You have severe, continued headaches not  relieved by medicine. Only take over-the-counter or prescription medicines for pain, fever, or discomfort as directed by your health care provider. °· You do not have normal function of the arms or legs or are unable to walk. °· You notice changes in the black spots in the center of the colored part of your eye (pupil). °· You have a clear or bloody fluid coming from your nose or ears. °· You have a loss of vision. °During the next 24 hours after the injury, you must stay with someone who can watch you for the warning signs. This person should contact local emergency services (911 in the [place] if you have seizures, you become unconscious, or you are unable to wake up. °HOW CAN I PREVENT A HEAD INJURY IN THE FUTURE? °The most important factor for preventing major head injuries is avoiding motor vehicle accidents.  To minimize the potential for damage to your head, it is crucial to wear seat belts while riding in motor vehicles. Wearing helmets while bike riding and playing collision sports (like football) is also helpful. Also, avoiding dangerous activities around the house will further help reduce your risk of head injury.  °WHEN CAN I RETURN TO NORMAL ACTIVITIES AND ATHLETICS? °You should be reevaluated by your health care provider before returning to these activities. If you have any of the following symptoms, you should not return to activities or contact sports until 1 week after the symptoms have stopped: °· Persistent headache. °· Dizziness or vertigo. °· Poor attention and concentration. °· Confusion. °·   Memory problems.  Nausea or vomiting.  Fatigue or tire easily.  Irritability.  Intolerant of bright lights or loud noises.  Anxiety or depression.  Disturbed sleep. MAKE SURE YOU:   Understand these instructions.  Will watch your condition.  Will get help right away if you are not doing well or get worse.   This information is not intended to replace advice given to you by your health  care provider. Make sure you discuss any questions you have with your health care provider.   Document Released: 09/08/2005 Document Revised: 09/29/2014 Document Reviewed: 05/16/2013 Elsevier Interactive Patient Education 2016 Elsevier Inc.  Laceration Care, Adult A laceration is a cut that goes through all of the layers of the skin and into the tissue that is right under the skin. Some lacerations heal on their own. Others need to be closed with stitches (sutures), staples, skin adhesive strips, or skin glue. Proper laceration care minimizes the risk of infection and helps the laceration to heal better. HOW TO CARE FOR YOUR LACERATION If sutures or staples were used:  Keep the wound clean and dry.  If you were given a bandage (dressing), you should change it at least one time per day or as told by your health care provider. You should also change it if it becomes wet or dirty.  Keep the wound completely dry for the first 24 hours or as told by your health care provider. After that time, you may shower or bathe. However, make sure that the wound is not soaked in water until after the sutures or staples have been removed.  Clean the wound one time each day or as told by your health care provider:  Wash the wound with soap and water.  Rinse the wound with water to remove all soap.  Pat the wound dry with a clean towel. Do not rub the wound.  After cleaning the wound, apply a thin layer of antibiotic ointmentas told by your health care provider. This will help to prevent infection and keep the dressing from sticking to the wound.  Have the sutures or staples removed as told by your health care provider. If skin adhesive strips were used:  Keep the wound clean and dry.  If you were given a bandage (dressing), you should change it at least one time per day or as told by your health care provider. You should also change it if it becomes dirty or wet.  Do not get the skin adhesive strips  wet. You may shower or bathe, but be careful to keep the wound dry.  If the wound gets wet, pat it dry with a clean towel. Do not rub the wound.  Skin adhesive strips fall off on their own. You may trim the strips as the wound heals. Do not remove skin adhesive strips that are still stuck to the wound. They will fall off in time. If skin glue was used:  Try to keep the wound dry, but you may briefly wet it in the shower or bath. Do not soak the wound in water, such as by swimming.  After you have showered or bathed, gently pat the wound dry with a clean towel. Do not rub the wound.  Do not do any activities that will make you sweat heavily until the skin glue has fallen off on its own.  Do not apply liquid, cream, or ointment medicine to the wound while the skin glue is in place. Using those may loosen the film before  the wound has healed.  If you were given a bandage (dressing), you should change it at least one time per day or as told by your health care provider. You should also change it if it becomes dirty or wet.  If a dressing is placed over the wound, be careful not to apply tape directly over the skin glue. Doing that may cause the glue to be pulled off before the wound has healed.  Do not pick at the glue. The skin glue usually remains in place for 5-10 days, then it falls off of the skin. General Instructions  Take over-the-counter and prescription medicines only as told by your health care provider.  If you were prescribed an antibiotic medicine or ointment, take or apply it as told by your doctor. Do not stop using it even if your condition improves.  To help prevent scarring, make sure to cover your wound with sunscreen whenever you are outside after stitches are removed, after adhesive strips are removed, or when glue remains in place and the wound is healed. Make sure to wear a sunscreen of at least 30 SPF.  Do not scratch or pick at the wound.  Keep all follow-up visits  as told by your health care provider. This is important.  Check your wound every day for signs of infection. Watch for:  Redness, swelling, or pain.  Fluid, blood, or pus.  Raise (elevate) the injured area above the level of your heart while you are sitting or lying down, if possible. SEEK MEDICAL CARE IF:  You received a tetanus shot and you have swelling, severe pain, redness, or bleeding at the injection site.  You have a fever.  A wound that was closed breaks open.  You notice a bad smell coming from your wound or your dressing.  You notice something coming out of the wound, such as wood or glass.  Your pain is not controlled with medicine.  You have increased redness, swelling, or pain at the site of your wound.  You have fluid, blood, or pus coming from your wound.  You notice a change in the color of your skin near your wound.  You need to change the dressing frequently due to fluid, blood, or pus draining from the wound.  You develop a new rash.  You develop numbness around the wound. SEEK IMMEDIATE MEDICAL CARE IF:  You develop severe swelling around the wound.  Your pain suddenly increases and is severe.  You develop painful lumps near the wound or on skin that is anywhere on your body.  You have a red streak going away from your wound.  The wound is on your hand or foot and you cannot properly move a finger or toe.  The wound is on your hand or foot and you notice that your fingers or toes look pale or bluish.   This information is not intended to replace advice given to you by your health care provider. Make sure you discuss any questions you have with your health care provider.   Document Released: 09/08/2005 Document Revised: 01/23/2015 Document Reviewed: 09/04/2014 Elsevier Interactive Patient Education 2016 Elsevier Inc. Urinary Tract Infection Urinary tract infections (UTIs) can develop anywhere along your urinary tract. Your urinary tract is  your body's drainage system for removing wastes and extra water. Your urinary tract includes two kidneys, two ureters, a bladder, and a urethra. Your kidneys are a pair of bean-shaped organs. Each kidney is about the size of your fist. They are  located below your ribs, one on each side of your spine. CAUSES Infections are caused by microbes, which are microscopic organisms, including fungi, viruses, and bacteria. These organisms are so small that they can only be seen through a microscope. Bacteria are the microbes that most commonly cause UTIs. SYMPTOMS  Symptoms of UTIs may vary by age and gender of the patient and by the location of the infection. Symptoms in young women typically include a frequent and intense urge to urinate and a painful, burning feeling in the bladder or urethra during urination. Older women and men are more likely to be tired, shaky, and weak and have muscle aches and abdominal pain. A fever may mean the infection is in your kidneys. Other symptoms of a kidney infection include pain in your back or sides below the ribs, nausea, and vomiting. DIAGNOSIS To diagnose a UTI, your caregiver will ask you about your symptoms. Your caregiver will also ask you to provide a urine sample. The urine sample will be tested for bacteria and white blood cells. White blood cells are made by your body to help fight infection. TREATMENT  Typically, UTIs can be treated with medication. Because most UTIs are caused by a bacterial infection, they usually can be treated with the use of antibiotics. The choice of antibiotic and length of treatment depend on your symptoms and the type of bacteria causing your infection. HOME CARE INSTRUCTIONS  If you were prescribed antibiotics, take them exactly as your caregiver instructs you. Finish the medication even if you feel better after you have only taken some of the medication.  Drink enough water and fluids to keep your urine clear or pale yellow.  Avoid  caffeine, tea, and carbonated beverages. They tend to irritate your bladder.  Empty your bladder often. Avoid holding urine for long periods of time.  Empty your bladder before and after sexual intercourse.  After a bowel movement, women should cleanse from front to back. Use each tissue only once. SEEK MEDICAL CARE IF:   You have back pain.  You develop a fever.  Your symptoms do not begin to resolve within 3 days. SEEK IMMEDIATE MEDICAL CARE IF:   You have severe back pain or lower abdominal pain.  You develop chills.  You have nausea or vomiting.  You have continued burning or discomfort with urination. MAKE SURE YOU:   Understand these instructions.  Will watch your condition.  Will get help right away if you are not doing well or get worse.   This information is not intended to replace advice given to you by your health care provider. Make sure you discuss any questions you have with your health care provider.   Document Released: 06/18/2005 Document Revised: 05/30/2015 Document Reviewed: 10/17/2011 Elsevier Interactive Patient Education [employer]

## 2016-02-14 NOTE — ED Notes (Signed)
Bed: WA14 Expected date:  Expected time:  Means of arrival:  Comments: EMS 

## 2016-09-24 ENCOUNTER — Encounter: Payer: Self-pay | Admitting: Pulmonary Disease

## 2016-09-25 ENCOUNTER — Institutional Professional Consult (permissible substitution): Payer: Medicare Other | Admitting: Pulmonary Disease

## 2016-09-25 ENCOUNTER — Telehealth: Payer: Self-pay | Admitting: Pulmonary Disease

## 2016-09-25 NOTE — Telephone Encounter (Signed)
IMAGING CT CHEST W/O 04/07/15 (personally reviewed by me): Linear opacity in the lingula suggestive of scar formation. Patchy subpleural opacification bilateral upper lung zones. 8 mm calcified nodule left lower lung subpleural. Minor splenic calcification. No pleural effusion or thickening. No pericardial effusion. No Blood in mediastinal adenopathy.  CARDIAC TTE (05/26/14): LV normal in size with moderate concentric hypertrophy. EF 65-70%. No regional wall motion abnormalities. LA & RA normal in size. RV normal in size and function. No aortic stenosis or regurgitation. Aortic root normal in size. No mitral stenosis or regurgitation. No pulmonic stenosis. Trivial tricuspid regurgitation. No pericardial effusion.  LABS 05/26/14 ABG:  7.448 / 32.3 / 62.0 / 93%

## 2016-10-30 ENCOUNTER — Institutional Professional Consult (permissible substitution): Payer: Medicare Other | Admitting: Pulmonary Disease

## 2016-11-20 ENCOUNTER — Institutional Professional Consult (permissible substitution): Payer: Medicare Other | Admitting: Pulmonary Disease

## 2017-05-11 ENCOUNTER — Encounter (HOSPITAL_COMMUNITY): Payer: Self-pay

## 2017-05-11 ENCOUNTER — Emergency Department (HOSPITAL_COMMUNITY): Payer: Medicare Other

## 2017-05-11 ENCOUNTER — Inpatient Hospital Stay (HOSPITAL_COMMUNITY)
Admission: EM | Admit: 2017-05-11 | Discharge: 2017-05-19 | DRG: 689 | Disposition: A | Discharge status: Home Health | Payer: Medicare Other | Attending: Family Medicine | Admitting: Family Medicine

## 2017-05-11 ENCOUNTER — Inpatient Hospital Stay (HOSPITAL_COMMUNITY): Payer: Medicare Other

## 2017-05-11 DIAGNOSIS — G9340 Encephalopathy, unspecified: Secondary | ICD-10-CM

## 2017-05-11 DIAGNOSIS — E871 Hypo-osmolality and hyponatremia: Secondary | ICD-10-CM | POA: Diagnosis present

## 2017-05-11 DIAGNOSIS — J449 Chronic obstructive pulmonary disease, unspecified: Secondary | ICD-10-CM | POA: Diagnosis present

## 2017-05-11 DIAGNOSIS — F329 Major depressive disorder, single episode, unspecified: Secondary | ICD-10-CM | POA: Diagnosis present

## 2017-05-11 DIAGNOSIS — N390 Urinary tract infection, site not specified: Secondary | ICD-10-CM | POA: Diagnosis present

## 2017-05-11 DIAGNOSIS — K219 Gastro-esophageal reflux disease without esophagitis: Secondary | ICD-10-CM | POA: Diagnosis present

## 2017-05-11 DIAGNOSIS — X58XXXA Exposure to other specified factors, initial encounter: Secondary | ICD-10-CM | POA: Diagnosis present

## 2017-05-11 DIAGNOSIS — Z8679 Personal history of other diseases of the circulatory system: Secondary | ICD-10-CM

## 2017-05-11 DIAGNOSIS — R0602 Shortness of breath: Secondary | ICD-10-CM | POA: Diagnosis not present

## 2017-05-11 DIAGNOSIS — M4854XA Collapsed vertebra, not elsewhere classified, thoracic region, initial encounter for fracture: Secondary | ICD-10-CM | POA: Diagnosis present

## 2017-05-11 DIAGNOSIS — G92 Toxic encephalopathy: Secondary | ICD-10-CM | POA: Diagnosis present

## 2017-05-11 DIAGNOSIS — D72829 Elevated white blood cell count, unspecified: Secondary | ICD-10-CM | POA: Diagnosis not present

## 2017-05-11 DIAGNOSIS — E872 Acidosis, unspecified: Secondary | ICD-10-CM

## 2017-05-11 DIAGNOSIS — I10 Essential (primary) hypertension: Secondary | ICD-10-CM | POA: Diagnosis present

## 2017-05-11 DIAGNOSIS — F17210 Nicotine dependence, cigarettes, uncomplicated: Secondary | ICD-10-CM | POA: Diagnosis present

## 2017-05-11 DIAGNOSIS — N179 Acute kidney failure, unspecified: Secondary | ICD-10-CM | POA: Diagnosis present

## 2017-05-11 DIAGNOSIS — F39 Unspecified mood [affective] disorder: Secondary | ICD-10-CM | POA: Diagnosis present

## 2017-05-11 DIAGNOSIS — R748 Abnormal levels of other serum enzymes: Secondary | ICD-10-CM | POA: Diagnosis not present

## 2017-05-11 DIAGNOSIS — R0682 Tachypnea, not elsewhere classified: Secondary | ICD-10-CM | POA: Diagnosis present

## 2017-05-11 DIAGNOSIS — Z79899 Other long term (current) drug therapy: Secondary | ICD-10-CM

## 2017-05-11 DIAGNOSIS — Z79891 Long term (current) use of opiate analgesic: Secondary | ICD-10-CM

## 2017-05-11 DIAGNOSIS — A630 Anogenital (venereal) warts: Secondary | ICD-10-CM | POA: Diagnosis present

## 2017-05-11 DIAGNOSIS — E785 Hyperlipidemia, unspecified: Secondary | ICD-10-CM | POA: Diagnosis present

## 2017-05-11 DIAGNOSIS — F0390 Unspecified dementia without behavioral disturbance: Secondary | ICD-10-CM | POA: Diagnosis present

## 2017-05-11 DIAGNOSIS — R531 Weakness: Secondary | ICD-10-CM | POA: Diagnosis present

## 2017-05-11 DIAGNOSIS — F419 Anxiety disorder, unspecified: Secondary | ICD-10-CM | POA: Diagnosis present

## 2017-05-11 DIAGNOSIS — Z7952 Long term (current) use of systemic steroids: Secondary | ICD-10-CM

## 2017-05-11 DIAGNOSIS — Z823 Family history of stroke: Secondary | ICD-10-CM | POA: Diagnosis not present

## 2017-05-11 DIAGNOSIS — Z90710 Acquired absence of both cervix and uterus: Secondary | ICD-10-CM | POA: Diagnosis not present

## 2017-05-11 DIAGNOSIS — E876 Hypokalemia: Secondary | ICD-10-CM | POA: Diagnosis present

## 2017-05-11 DIAGNOSIS — B9620 Unspecified Escherichia coli [E. coli] as the cause of diseases classified elsewhere: Secondary | ICD-10-CM | POA: Diagnosis present

## 2017-05-11 DIAGNOSIS — E874 Mixed disorder of acid-base balance: Secondary | ICD-10-CM | POA: Diagnosis present

## 2017-05-11 DIAGNOSIS — E8729 Other acidosis: Secondary | ICD-10-CM

## 2017-05-11 DIAGNOSIS — R29898 Other symptoms and signs involving the musculoskeletal system: Secondary | ICD-10-CM | POA: Diagnosis not present

## 2017-05-11 DIAGNOSIS — Z7951 Long term (current) use of inhaled steroids: Secondary | ICD-10-CM

## 2017-05-11 DIAGNOSIS — K8020 Calculus of gallbladder without cholecystitis without obstruction: Secondary | ICD-10-CM | POA: Diagnosis present

## 2017-05-11 LAB — I-STAT CG4 LACTIC ACID, ED: Lactic Acid, Venous: 1.09 mmol/L (ref 0.5–1.9)

## 2017-05-11 LAB — URINALYSIS, ROUTINE W REFLEX MICROSCOPIC
BILIRUBIN URINE: NEGATIVE
Glucose, UA: NEGATIVE mg/dL
Ketones, ur: NEGATIVE mg/dL
LEUKOCYTES UA: NEGATIVE
Nitrite: NEGATIVE
Protein, ur: NEGATIVE mg/dL
SPECIFIC GRAVITY, URINE: 1.017 (ref 1.005–1.030)
pH: 5 (ref 5.0–8.0)

## 2017-05-11 LAB — CBC WITH DIFFERENTIAL/PLATELET
BASOS ABS: 0 10*3/uL (ref 0.0–0.1)
BASOS PCT: 0 %
Eosinophils Absolute: 0 10*3/uL (ref 0.0–0.7)
Eosinophils Relative: 0 %
HEMATOCRIT: 31.9 % — AB (ref 36.0–46.0)
HEMOGLOBIN: 10.5 g/dL — AB (ref 12.0–15.0)
LYMPHS PCT: 9 %
Lymphs Abs: 1.4 10*3/uL (ref 0.7–4.0)
MCH: 32 pg (ref 26.0–34.0)
MCHC: 32.9 g/dL (ref 30.0–36.0)
MCV: 97.3 fL (ref 78.0–100.0)
Monocytes Absolute: 1 10*3/uL (ref 0.1–1.0)
Monocytes Relative: 6 %
NEUTROS ABS: 14 10*3/uL — AB (ref 1.7–7.7)
NEUTROS PCT: 85 %
Platelets: 461 10*3/uL — ABNORMAL HIGH (ref 150–400)
RBC: 3.28 MIL/uL — ABNORMAL LOW (ref 3.87–5.11)
RDW: 14.2 % (ref 11.5–15.5)
WBC: 16.5 10*3/uL — ABNORMAL HIGH (ref 4.0–10.5)

## 2017-05-11 LAB — COMPREHENSIVE METABOLIC PANEL
ALBUMIN: 2.8 g/dL — AB (ref 3.5–5.0)
ALK PHOS: 222 U/L — AB (ref 38–126)
ALT: 34 U/L (ref 14–54)
AST: 40 U/L (ref 15–41)
Anion gap: 9 (ref 5–15)
BILIRUBIN TOTAL: 0.3 mg/dL (ref 0.3–1.2)
BUN: 38 mg/dL — ABNORMAL HIGH (ref 6–20)
CALCIUM: 8.2 mg/dL — AB (ref 8.9–10.3)
CO2: 16 mmol/L — ABNORMAL LOW (ref 22–32)
CREATININE: 2.14 mg/dL — AB (ref 0.44–1.00)
Chloride: 105 mmol/L (ref 101–111)
GFR calc Af Amer: 26 mL/min — ABNORMAL LOW (ref >60)
GFR calc non Af Amer: 22 mL/min — ABNORMAL LOW (ref >60)
GLUCOSE: 130 mg/dL — AB (ref 65–99)
Potassium: 4.1 mmol/L (ref 3.5–5.1)
Sodium: 130 mmol/L — ABNORMAL LOW (ref 135–145)
TOTAL PROTEIN: 6 g/dL — AB (ref 6.5–8.1)

## 2017-05-11 LAB — CREATININE, URINE, RANDOM: CREATININE, URINE: 92.85 mg/dL

## 2017-05-11 LAB — I-STAT TROPONIN, ED: TROPONIN I, POC: 0 ng/mL (ref 0.00–0.08)

## 2017-05-11 LAB — PROTIME-INR
INR: 1.09
Prothrombin Time: 14.1 seconds (ref 11.4–15.2)

## 2017-05-11 LAB — SODIUM, URINE, RANDOM: SODIUM UR: 16 mmol/L

## 2017-05-11 LAB — LIPASE, BLOOD: Lipase: 21 U/L (ref 11–51)

## 2017-05-11 MED ORDER — DIAZEPAM 5 MG PO TABS
5.0000 mg | ORAL_TABLET | Freq: Three times a day (TID) | ORAL | Status: DC
  Administered 2017-05-11 – 2017-05-12 (×3): 5 mg via ORAL
  Filled 2017-05-11 (×3): qty 1

## 2017-05-11 MED ORDER — POLYETHYLENE GLYCOL 3350 17 G PO PACK
17.0000 g | PACK | Freq: Every day | ORAL | Status: AC
  Administered 2017-05-12 – 2017-05-15 (×4): 17 g via ORAL
  Filled 2017-05-11 (×5): qty 1

## 2017-05-11 MED ORDER — ACETAMINOPHEN 325 MG PO TABS
650.0000 mg | ORAL_TABLET | Freq: Four times a day (QID) | ORAL | Status: DC | PRN
  Administered 2017-05-13 – 2017-05-17 (×4): 650 mg via ORAL
  Filled 2017-05-11 (×4): qty 2

## 2017-05-11 MED ORDER — SODIUM CHLORIDE 0.9 % IV SOLN
INTRAVENOUS | Status: DC
  Administered 2017-05-11: 22:00:00 via INTRAVENOUS

## 2017-05-11 MED ORDER — ATORVASTATIN CALCIUM 10 MG PO TABS
10.0000 mg | ORAL_TABLET | Freq: Every day | ORAL | Status: DC
  Administered 2017-05-11 – 2017-05-18 (×8): 10 mg via ORAL
  Filled 2017-05-11 (×8): qty 1

## 2017-05-11 MED ORDER — ACETAMINOPHEN 650 MG RE SUPP: 650.0000 mg | Freq: Four times a day (QID) | RECTAL | Status: DC | PRN

## 2017-05-11 MED ORDER — SODIUM CHLORIDE 0.9 % IV BOLUS (SEPSIS): 1000.0000 mL | Freq: Once | INTRAVENOUS | Status: DC

## 2017-05-11 MED ORDER — PREDNISONE 5 MG PO TABS
2.5000 mg | ORAL_TABLET | Freq: Every day | ORAL | Status: DC
  Administered 2017-05-12 – 2017-05-19 (×8): 2.5 mg via ORAL
  Filled 2017-05-11 (×8): qty 1

## 2017-05-11 MED ORDER — HEPARIN SODIUM (PORCINE) 5000 UNIT/ML IJ SOLN
5000.0000 [IU] | Freq: Three times a day (TID) | INTRAMUSCULAR | Status: DC
  Administered 2017-05-11 – 2017-05-19 (×24): 5000 [IU] via SUBCUTANEOUS
  Filled 2017-05-11 (×24): qty 1

## 2017-05-11 MED ORDER — POLYSACCHARIDE IRON COMPLEX 150 MG PO CAPS
150.0000 mg | ORAL_CAPSULE | Freq: Every day | ORAL | Status: DC
  Administered 2017-05-12 – 2017-05-19 (×8): 150 mg via ORAL
  Filled 2017-05-11 (×8): qty 1

## 2017-05-11 MED ORDER — METOPROLOL TARTRATE 25 MG PO TABS
25.0000 mg | ORAL_TABLET | Freq: Two times a day (BID) | ORAL | Status: DC
  Administered 2017-05-11 – 2017-05-19 (×16): 25 mg via ORAL
  Filled 2017-05-11 (×16): qty 1

## 2017-05-11 MED ORDER — ONDANSETRON HCL 4 MG PO TABS: 4.0000 mg | ORAL_TABLET | Freq: Four times a day (QID) | ORAL | Status: DC | PRN

## 2017-05-11 MED ORDER — SODIUM CHLORIDE 0.9 % IV BOLUS (SEPSIS)
1000.0000 mL | Freq: Once | INTRAVENOUS | Status: AC
  Administered 2017-05-11: 1000 mL via INTRAVENOUS

## 2017-05-11 MED ORDER — VENLAFAXINE HCL 75 MG PO TABS
75.0000 mg | ORAL_TABLET | Freq: Two times a day (BID) | ORAL | Status: DC
  Administered 2017-05-11 – 2017-05-19 (×16): 75 mg via ORAL
  Filled 2017-05-11 (×18): qty 1

## 2017-05-11 MED ORDER — QUETIAPINE FUMARATE 25 MG PO TABS
25.0000 mg | ORAL_TABLET | Freq: Two times a day (BID) | ORAL | Status: DC
  Administered 2017-05-11 – 2017-05-12 (×3): 25 mg via ORAL
  Filled 2017-05-11 (×3): qty 1

## 2017-05-11 MED ORDER — MOMETASONE FURO-FORMOTEROL FUM 200-5 MCG/ACT IN AERO
2.0000 | INHALATION_SPRAY | Freq: Two times a day (BID) | RESPIRATORY_TRACT | Status: DC
  Administered 2017-05-12 – 2017-05-19 (×13): 2 via RESPIRATORY_TRACT
  Filled 2017-05-11 (×2): qty 8.8

## 2017-05-11 MED ORDER — OXYCODONE HCL 5 MG PO TABS
5.0000 mg | ORAL_TABLET | Freq: Four times a day (QID) | ORAL | Status: DC
  Administered 2017-05-11 – 2017-05-12 (×4): 5 mg via ORAL
  Filled 2017-05-11 (×5): qty 1

## 2017-05-11 MED ORDER — ONDANSETRON HCL 4 MG/2ML IJ SOLN
4.0000 mg | Freq: Four times a day (QID) | INTRAMUSCULAR | Status: DC | PRN
  Administered 2017-05-13: 4 mg via INTRAVENOUS
  Filled 2017-05-11: qty 2

## 2017-05-11 MED ORDER — TIOTROPIUM BROMIDE MONOHYDRATE 18 MCG IN CAPS
18.0000 ug | ORAL_CAPSULE | Freq: Every day | RESPIRATORY_TRACT | Status: DC
  Administered 2017-05-12 – 2017-05-19 (×8): 18 ug via RESPIRATORY_TRACT
  Filled 2017-05-11 (×2): qty 5

## 2017-05-11 NOTE — ED Provider Notes (Signed)
[facility]-EMERGENCY DEPT Provider Note   CSN: [id number] Arrival date & time: 05/11/17  1325     History   Chief Complaint Chief Complaint  Patient presents with  . Extremity Weakness    HPI [name] is a 71 y.o. female.  The history is provided by the patient and medical records. The history is limited by the condition of the patient. No language interpreter was used.  Illness  This is a new problem. The current episode started 2 days ago. The problem occurs constantly. The problem has been gradually worsening. Pertinent negatives include no chest pain, no abdominal pain, no headaches and no shortness of breath. Nothing aggravates the symptoms. Nothing relieves the symptoms. She has tried nothing for the symptoms. The treatment provided no relief.    Past Medical History:  Diagnosis Date  . Anemia   . Anxiety   . Arthritis    "some; all over" (05/29/2014)  . Asthma   . Chronic airway obstruction (HCC)   . COPD (chronic obstructive pulmonary disease) (HCC)   . Dementia   . Depression   . Encephalopathy   . Esophagitis   . Essential hypertension   . GERD (gastroesophageal reflux disease)   . GIB (gastrointestinal bleeding)   . HLD (hyperlipidemia)   . Hypopotassemia   . Hyposmolality and/or hyponatremia   . Insomnia   . Tobacco abuse     Patient Active Problem List   Diagnosis Date Noted  . ICH (intracerebral hemorrhage) (HCC) 05/26/2014  . Hyponatremia 05/26/2014  . Acute encephalopathy 05/26/2014  . Sepsis (HCC) 05/26/2014  . CVA (cerebral vascular accident) (HCC) 05/25/2014  . Intracerebral hemorrhage (HCC) 05/25/2014    Past Surgical History:  Procedure Laterality Date  . ABDOMINAL HYSTERECTOMY    . APPENDECTOMY    . CHOLECYSTECTOMY    . TONSILLECTOMY    . TUBAL LIGATION      OB History    No data available       Home Medications    Prior to Admission medications   Medication Sig Start Date End Date Taking? Authorizing Provider    acetaminophen (TYLENOL) 325 MG tablet Take 650 mg by mouth 3 (three) times daily. Scheduled   Yes [provider]  albuterol (PROVENTIL HFA;VENTOLIN HFA) 108 (90 Base) MCG/ACT inhaler Inhale 2 puffs into the lungs daily as needed for wheezing or shortness of breath.    Yes [provider]  atorvastatin (LIPITOR) 10 MG tablet Take 10 mg by mouth at bedtime.   Yes [provider]  budesonide-formoterol (SYMBICORT) 160-4.5 MCG/ACT inhaler Inhale 2 puffs into the lungs 2 (two) times daily.   Yes [provider]  diazepam (VALIUM) 5 MG tablet Take 5 mg by mouth 3 (three) times daily.    Yes [provider]  iron polysaccharides (FERREX 150) 150 MG capsule Take 150 mg by mouth daily with breakfast.   Yes [provider]  lisinopril (PRINIVIL,ZESTRIL) 10 MG tablet Take 10 mg by mouth daily with breakfast.   Yes [provider]  loperamide (IMODIUM) 2 MG capsule Take 2 mg by mouth 4 (four) times daily as needed for diarrhea or loose stools.   Yes [provider]  metoprolol tartrate (LOPRESSOR) 25 MG tablet Take 25 mg by mouth 2 (two) times daily.   Yes [provider]  nystatin (NYSTATIN) powder Apply 1 g topically 2 (two) times daily as needed (for rash).   Yes [provider]  oxyCODONE (OXY IR/ROXICODONE) 5 MG immediate release tablet  Take 5 mg by mouth 4 (four) times daily.   Yes [provider]  predniSONE (DELTASONE) 2.5 MG tablet Take 2.5 mg by mouth daily with breakfast.   Yes [provider]  QUEtiapine (SEROQUEL) 25 MG tablet Take 25 mg by mouth 2 (two) times daily.   Yes [provider]  tiotropium (SPIRIVA) 18 MCG inhalation capsule Place 18 mcg into inhaler and inhale daily.   Yes [provider]  trolamine salicylate (ARTHRICREAM) 10 % cream Apply 1 application topically 2 (two) times daily as needed for muscle pain. May self administer   Yes [provider]   venlafaxine (EFFEXOR) 75 MG tablet Take 75 mg by mouth 2 (two) times daily.   Yes [provider]  vitamin B-12 (CYANOCOBALAMIN) 500 MCG tablet Take 500 mcg by mouth daily with breakfast.    Yes [provider]    Family History No family history on file.  Social History Social History  Substance Use Topics  . Smoking status: Current Every Day Smoker    Packs/day: 0.25    Years: 50.00    Types: Cigarettes  . Smokeless tobacco: Never Used  . Alcohol use No     Allergies   Patient has no known allergies.   Review of Systems Review of Systems  Constitutional: Positive for fatigue. Negative for chills, diaphoresis and fever.  HENT: Negative for congestion and rhinorrhea.   Respiratory: Negative for cough, chest tightness, shortness of breath, wheezing and stridor.   Cardiovascular: Negative for chest pain, palpitations and leg swelling.  Gastrointestinal: Negative for abdominal pain, constipation, diarrhea, nausea and vomiting.  Genitourinary: Negative for dysuria, flank pain, pelvic pain and urgency.  Musculoskeletal: Negative for back pain, neck pain and neck stiffness.  Skin: Negative for rash and wound.  Neurological: Positive for weakness and light-headedness. Negative for dizziness, numbness and headaches.  Psychiatric/Behavioral: Negative for agitation.  All other systems reviewed and are negative.    Physical Exam Updated Vital Signs BP 128/70 (BP Location: Right Arm)   Pulse 81   Temp 97.8 F (36.6 C) (Oral)   Resp 14   Ht 5\' 6"  (1.676 m)   Wt 72.6 kg (160 lb)   SpO2 98%   BMI 25.82 kg/m   Physical Exam  Constitutional: She appears well-developed and well-nourished. No distress.  HENT:  Head: Normocephalic.  Mouth/Throat: Oropharynx is clear and moist. No oropharyngeal exudate.  Eyes: Pupils are equal, round, and reactive to light. Conjunctivae and EOM are normal.  Neck: Normal range of motion.  Cardiovascular: Normal rate and intact  distal pulses.   No murmur heard. Pulmonary/Chest: Effort normal. No stridor. No respiratory distress. She has no wheezes. She has no rales. She exhibits no tenderness.  Abdominal: Soft. She exhibits no distension. There is no tenderness.  Musculoskeletal: She exhibits no tenderness.  Neurological: She is alert. She is disoriented. She displays no tremor. No cranial nerve deficit or sensory deficit. She exhibits abnormal muscle tone. She displays no seizure activity. Coordination normal. GCS eye subscore is 4. GCS verbal subscore is 5. GCS motor subscore is 6.  R arm weakness compared to L. R arm has drift with eyes closed. Symmpetric grip strength. No sensation abnormality. No faical droop.   Skin: Capillary refill takes less than 2 seconds. No rash noted. She is not diaphoretic. No erythema.  Psychiatric: She has a normal mood and affect.  Nursing note and vitals reviewed.    ED Treatments / Results  Labs (all labs ordered  are listed, but only abnormal results are displayed) Labs Reviewed  CBC WITH DIFFERENTIAL/PLATELET - Abnormal; Notable for the following:       Result Value   WBC 16.5 (*)    RBC 3.28 (*)    Hemoglobin 10.5 (*)    HCT 31.9 (*)    Platelets 461 (*)    Neutro Abs 14.0 (*)    All other components within normal limits  COMPREHENSIVE METABOLIC PANEL - Abnormal; Notable for the following:    Sodium 130 (*)    CO2 16 (*)    Glucose, Bld 130 (*)    BUN 38 (*)    Creatinine, Ser 2.14 (*)    Calcium 8.2 (*)    Total Protein 6.0 (*)    Albumin 2.8 (*)    Alkaline Phosphatase 222 (*)    GFR calc non Af Amer 22 (*)    GFR calc Af Amer 26 (*)    All other components within normal limits  URINALYSIS, ROUTINE W REFLEX MICROSCOPIC - Abnormal; Notable for the following:    Hgb urine dipstick SMALL (*)    Bacteria, UA RARE (*)    Squamous Epithelial / LPF 0-5 (*)    All other components within normal limits  URINE CULTURE  LIPASE, BLOOD  PROTIME-INR  CREATININE,  URINE, RANDOM  SODIUM, URINE, RANDOM  I-STAT CG4 LACTIC ACID, ED  I-STAT TROPONIN, ED    EKG  EKG Interpretation  Date/Time:  Monday May 11 2017 14:09:37 EDT Ventricular Rate:  78 PR Interval:    QRS Duration: 104 QT Interval:  377 QTC Calculation: 430 R Axis:   -24 Text Interpretation:  Sinus rhythm Borderline left axis deviation Low voltage, extremity leads When compared to prior, no significant changes seen.  No STEMI Confirmed by Theda Belfast ([id number]) on 05/11/2017 8:42:55 PM       Radiology Dg Chest 2 View  Result Date: 05/11/2017 CLINICAL DATA:  Cough, fatigue, and hypotension. EXAM: CHEST  2 VIEW COMPARISON:  Chest x-ray dated 04/06/2015 and chest CT dated 04/07/2015 FINDINGS: Heart size and pulmonary vascularity are normal. There is slight scarring as well as a prominent left apical pericardial fat pad at the left lung base laterally. No effusions or infiltrates. Multiple old compression fractures in the upper and midthoracic spine. No acute fractures. Aortic atherosclerosis. IMPRESSION: No active cardiopulmonary disease. Electronically Signed   By: Francene Boyers M.D.   On: 05/11/2017 15:24   Ct Head Wo Contrast  Result Date: 05/11/2017 CLINICAL DATA:  Hypotension. Sleeping a lot. Not feeling well. Focal neural deficit greater than 6 hours (deficit not specified). EXAM: CT HEAD WITHOUT CONTRAST TECHNIQUE: Contiguous axial images were obtained from the base of the skull through the vertex without intravenous contrast. COMPARISON:  02/14/2016 FINDINGS: Brain: No evidence of acute infarction, hemorrhage, hydrocephalus, extra-axial collection or mass lesion/mass effect. Remote small vessel infarcts in the right lateral lenticulostriate distribution, right thalamus, and likely left upper putamen. Remote small vessel infarcts in the left parietal white matter and upper left cerebellum. Cerebral volume loss with ventriculomegaly, stable. Vascular: Atherosclerotic calcification.  No  hyperdense vessel. Skull: No acute or aggressive finding. Sinuses/Orbits: Negative IMPRESSION: 1. No acute finding or change from 2017. 2. 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 Electronically Signed   By: Marnee Spring M.D.   On: 05/11/2017 15:38    Procedures Procedures (including critical care time)  Medications Ordered in ED Medications  sodium chloride 0.9 % bolus 1,000 mL (not administered)  Initial Impression / Assessment and Plan / ED Course  I have reviewed the triage vital signs and the nursing notes.  Pertinent labs & imaging results that were available during my care of the patient were reviewed by me and considered in my medical decision making (see chart for details).     Danielle [name] is a 71 y.o. female with past medical history significant for dementia, Stroke, COPD, prior GI bleed, hypertension, and hyperlipidemia who presents with fatigue and hypotension. Patient reports that she has been feeling fatigued for the last few days and according to report from EMS to nursing, the patient's blood pressure at the facility today was 70 systolic. Patient has not had any fevers, chills, nausea, vomiting, diarrhea, or any respiratory complaint. She denies any neurologic deficits. She denies any recent trauma or falls. She thinks she is eating and drinking but just feels tired.  On exam, patient's face did not show any facial droop. Normal speech. Normal extraocular movements and pupil exam. Lungs were clear. Chest is nontender. Abdomen nontender. No significant lower edema. Patient was noted to have a right arm drift with weakness compared to left. Patient says this is new. Unsure time of onset however, nursing reports that EMS said that the right arm weakness has been going on over the weekend.  Given the patient's arm weakness compared to left, patient will have workup to look for stroke. She will also have workup to look for dehydration or occult  infection. Patient given fluids and had no hypotension in ED. Patient's blood pressure improved into the 120s after fluids.  Laboratory testing results are seen above. No evidence of UTI on the urine. Patient does have an acute kidney injury with a significant jump in her creatinine. Patient will be given fluids. Patient also has leukocytosis however, urine did not show infection and chest x-ray does not show pneumonia. Consider viral infection. Patient is not febrile in the emergency department.  Given patient's reported hypotension as well as her acute kidney injury in the setting of likely dehydration, patient will be admitted for rehydration and further management of her kidney function. Still awaiting results of MRI however, if MRI shows acute stroke, will likely call neurology.   Final Clinical Impressions(s) / ED Diagnoses   Final diagnoses:  AKI (acute kidney injury) (HCC)     Clinical Impression: 1. AKI (acute kidney injury) [facility])     Disposition: Admit to Hospitalist service    Tegeler, Canary Brim, MD 05/11/17 2045

## 2017-05-11 NOTE — ED Notes (Signed)
Patient transported to MRI 

## 2017-05-11 NOTE — Progress Notes (Signed)
Call report to [facility] [phone] @ 1815

## 2017-05-11 NOTE — Clinical Social Work Note (Addendum)
Clinical Social Work Assessment  Patient Details  Name: Danielle [name] MRN: [id number] Date of Birth: [birth year]  Date of referral:  05/11/17               Reason for consult:  Facility Placement                Permission sought to share information with:    Permission granted to share information::  Yes, Verbal Permission Granted  Name::        Agency::     Relationship::     Contact Information:     Housing/Transportation Living arrangements for the past 2 months:  Assisted Living Facility Source of Information:   (Pt's SIL) Patient Interpreter Needed:  None Criminal Activity/Legal Involvement Pertinent to Current Situation/Hospitalization:    Significant Relationships:  Adult Children Lives with:  Facility Resident Do you feel safe going back to the place where you live?  Yes Need for family participation in patient care:  Yes (Comment)  Care giving concerns:  None listed by pt/family   Social Worker assessment / plan:  CSW called and spoke with pt's SIL who has medical power-of-attorney and who was speaking for the pt and the pt's daughter and confirmed pt's plan to be discharged back to [name] to [facility] at discharge.  CSW provided active listening and validated pt's SIL's concerns.   CSW DEPT was given permission to complete FL-2 and send referral out to SNF and/or back to [name] facilities via the hub per pt's request.  [name], prior to being [facility] to [name].  Pt's SIL daughter ask that the CSW assist with a family meeting or conference call be facilitated due to the pt's family wishes that the pt be considered for SNF placement with the family's hope that the pt will be considered by their SNF placement for long-term residential living.  Per pt's family pt was living at home after a long stay as a long-term care resident at a SNF, but was D/C'd home to live with family.  then would "self-medicate" with pain.  It became  obvious to the family the pt was a danger to herself and then had     Employment status:  Retired Health and safety inspector:  Medicare PT Recommendations:  Not assessed at this time Information / Referral to community resources:     Patient/Family's Response to care:  Patient alert and oriented.  Patient's daughter and SIL,agreeable to plan.  Pt's daughter and SIL supportive and strongly involved in pt.'s care.  Pt.'s daughter and SIL, pleasant and appreciated CSW intervention.    Patient/Family's Understanding of and Emotional Response to Diagnosis, Current Treatment, and Prognosis:  Still assessing   Emotional Assessment Appearance:    Attitude/Demeanor/Rapport:  Unable to Assess Affect (typically observed):  Unable to Assess Orientation:  Fluctuating Orientation (Suspected and/or reported Sundowners) Alcohol / Substance use:    Psych involvement (Current and /or in the community):     Discharge Needs  Concerns to be addressed:  No discharge needs identified Readmission within the last 30 days:  No Current discharge risk:  None Barriers to Discharge:  No Barriers Identified   Danielle [name], LCSWA 05/11/2017, 8:59 PM

## 2017-05-11 NOTE — ED Notes (Signed)
Bed: WA10 Expected date:  Expected time:  Means of arrival:  Comments: EMS-weakness 

## 2017-05-11 NOTE — ED Notes (Signed)
Patient placed on bed pan x2 by writer since 3pm and patient is still unable to void.

## 2017-05-11 NOTE — Progress Notes (Addendum)
Consult request has been received. CSW attempting to follow up at present time.  CSW noted in chart pt has a diagnosis of dementia and is from [facility] where she has lived for over a year.  CSW attempted to contact pt's daughter Netty Starring husband (SIL) Ellwood Dense at ph: [phone] and ph: [phone].  Pla is to return to [facility] at D/C to live   CSW will continue to follow in the ED, pt is being admitted.  Dorothe Pea. Tallin Hart, Francesco Sor, CSI Clinical Social Worker Ph: [phone]

## 2017-05-11 NOTE — ED Notes (Signed)
ED TO INPATIENT HANDOFF REPORT  Name/Age/Gender Danielle [name] 71 y.o. female  Code Status Code Status History    Date Active Date Inactive Code Status Order ID Comments User Context   05/26/2014  1:15 AM 05/30/2014  4:52 PM Full Code [id number]  Germain Osgood, PA-C Inpatient    Advance Directive Documentation     Most Recent Value  Type of Advance Directive  Living will  Pre-existing out of facility DNR order (yellow form or pink MOST form)  -  "MOST" Form in Place?  -      Home/SNF/Other Home  Chief Complaint weakness  Level of Care/Admitting Diagnosis ED Disposition    ED Disposition Condition [place]: [facility] [place] [[id number]]  Level of Care: Telemetry [5]  Admit to tele based on following criteria: Monitor QTC interval  Diagnosis: AKI (acute kidney injury) [facility]) [[id number]]  Admitting Physician: Charlynne Cousins [[id number]]  Attending Physician: Charlynne Cousins [[id number]]  Estimated length of stay: past midnight tomorrow  Certification:: I certify this patient will need inpatient services for at least 2 midnights  PT Class (Do Not Modify): Inpatient [101]  PT Acc Code (Do Not Modify): Private [1]       Medical History Past Medical History:  Diagnosis Date  . Anemia   . Anxiety   . Arthritis    "some; all over" (05/29/2014)  . Asthma   . Chronic airway obstruction ([place])   . COPD (chronic obstructive pulmonary disease) ([place])   . Dementia   . Depression   . Encephalopathy   . Esophagitis   . Essential hypertension   . GERD (gastroesophageal reflux disease)   . GIB (gastrointestinal bleeding)   . HLD (hyperlipidemia)   . Hypopotassemia   . Hyposmolality and/or hyponatremia   . Insomnia   . Tobacco abuse     Allergies No Known Allergies  IV Location/Drains/Wounds Patient Lines/Drains/Airways Status   Active Line/Drains/Airways    Name:   Placement date:   Placement time:   Site:   Days:   Peripheral IV 05/11/17  Left Antecubital  05/11/17    1337    Antecubital    less than 1   Wound / Incision (Open or Dehisced) 05/26/14 Other (Comment) Finger (Comment which one) Left left index finger knuckle red, swollen  05/26/14    0900    Finger (Comment which one)    1081   Wound / Incision (Open or Dehisced) 05/26/14 Other (Comment) Elbow Right right elbow red and swollen  05/26/14    1400    Elbow    1081          Labs/Imaging Results for orders placed or performed during the hospital encounter of 05/11/17 (from the past 48 hour(s))  CBC with Differential     Status: Abnormal   Collection Time: 05/11/17  2:26 PM  Result Value Ref Range   WBC 16.5 (H) 4.0 - 10.5 K/uL   RBC 3.28 (L) 3.87 - 5.11 MIL/uL   Hemoglobin 10.5 (L) 12.0 - 15.0 g/dL   HCT 31.9 (L) 36.0 - 46.0 %   MCV 97.3 78.0 - 100.0 fL   MCH 32.0 26.0 - 34.0 pg   MCHC 32.9 30.0 - 36.0 g/dL   RDW 14.2 11.5 - 15.5 %   Platelets 461 (H) 150 - 400 K/uL   Neutrophils Relative % 85 %   Neutro Abs 14.0 (H) 1.7 - 7.7 K/uL   Lymphocytes Relative 9 %  Lymphs Abs 1.4 0.7 - 4.0 K/uL   Monocytes Relative 6 %   Monocytes Absolute 1.0 0.1 - 1.0 K/uL   Eosinophils Relative 0 %   Eosinophils Absolute 0.0 0.0 - 0.7 K/uL   Basophils Relative 0 %   Basophils Absolute 0.0 0.0 - 0.1 K/uL  Comprehensive metabolic panel     Status: Abnormal   Collection Time: 05/11/17  2:26 PM  Result Value Ref Range   Sodium 130 (L) 135 - 145 mmol/L   Potassium 4.1 3.5 - 5.1 mmol/L   Chloride 105 101 - 111 mmol/L   CO2 16 (L) 22 - 32 mmol/L   Glucose, Bld 130 (H) 65 - 99 mg/dL   BUN 38 (H) 6 - 20 mg/dL   Creatinine, Ser 2.14 (H) 0.44 - 1.00 mg/dL   Calcium 8.2 (L) 8.9 - 10.3 mg/dL   Total Protein 6.0 (L) 6.5 - 8.1 g/dL   Albumin 2.8 (L) 3.5 - 5.0 g/dL   AST 40 15 - 41 U/L   ALT 34 14 - 54 U/L   Alkaline Phosphatase 222 (H) 38 - 126 U/L   Total Bilirubin 0.3 0.3 - 1.2 mg/dL   GFR calc non Af Amer 22 (L) >60 mL/min   GFR calc Af Amer 26 (L) >60 mL/min    Comment:  (NOTE) The eGFR has been calculated using the CKD EPI equation. This calculation has not been validated in all clinical situations. eGFR's persistently <60 mL/min signify possible Chronic Kidney Disease.    Anion gap 9 5 - 15  Lipase, blood     Status: None   Collection Time: 05/11/17  2:26 PM  Result Value Ref Range   Lipase 21 11 - 51 U/L  Protime-INR     Status: None   Collection Time: 05/11/17  2:26 PM  Result Value Ref Range   Prothrombin Time 14.1 11.4 - 15.2 seconds   INR 1.09   Urinalysis, Routine w reflex microscopic     Status: Abnormal   Collection Time: 05/11/17  2:59 PM  Result Value Ref Range   Color, Urine YELLOW YELLOW   APPearance CLEAR CLEAR   Specific Gravity, Urine 1.017 1.005 - 1.030   pH 5.0 5.0 - 8.0   Glucose, UA NEGATIVE NEGATIVE mg/dL   Hgb urine dipstick SMALL (A) NEGATIVE   Bilirubin Urine NEGATIVE NEGATIVE   Ketones, ur NEGATIVE NEGATIVE mg/dL   Protein, ur NEGATIVE NEGATIVE mg/dL   Nitrite NEGATIVE NEGATIVE   Leukocytes, UA NEGATIVE NEGATIVE   RBC / HPF 0-5 0 - 5 RBC/hpf   WBC, UA 0-5 0 - 5 WBC/hpf   Bacteria, UA RARE (A) NONE SEEN   Squamous Epithelial / LPF 0-5 (A) NONE SEEN   Mucus PRESENT    Hyaline Casts, UA PRESENT   I-Stat Troponin, ED (not at [facility])     Status: None   Collection Time: 05/11/17  4:07 PM  Result Value Ref Range   Troponin i, poc 0.00 0.00 - 0.08 ng/mL   Comment 3            Comment: Due to the release kinetics of cTnI, a negative result within the first hours of the onset of symptoms does not rule out myocardial infarction with certainty. If myocardial infarction is still suspected, repeat the test at appropriate intervals.   I-Stat CG4 Lactic Acid, ED     Status: None   Collection Time: 05/11/17  4:14 PM  Result Value Ref Range   Lactic Acid, Venous  1.09 0.5 - 1.9 mmol/L   Dg Chest 2 View  Result Date: 05/11/2017 CLINICAL DATA:  Cough, fatigue, and hypotension. EXAM: CHEST  2 VIEW COMPARISON:  Chest x-ray dated  04/06/2015 and chest CT dated 04/07/2015 FINDINGS: Heart size and pulmonary vascularity are normal. There is slight scarring as well as a prominent left apical pericardial fat pad at the left lung base laterally. No effusions or infiltrates. Multiple old compression fractures in the upper and midthoracic spine. No acute fractures. Aortic atherosclerosis. IMPRESSION: No active cardiopulmonary disease. Electronically Signed   By: Lorriane Shire M.D.   On: 05/11/2017 15:24   Ct Head Wo Contrast  Result Date: 05/11/2017 CLINICAL DATA:  Hypotension. Sleeping a lot. Not feeling well. Focal neural deficit greater than 6 hours (deficit not specified). EXAM: CT HEAD WITHOUT CONTRAST TECHNIQUE: Contiguous axial images were obtained from the base of the skull through the vertex without intravenous contrast. COMPARISON:  02/14/2016 FINDINGS: Brain: No evidence of acute infarction, hemorrhage, hydrocephalus, extra-axial collection or mass lesion/mass effect. Remote small vessel infarcts in the right lateral lenticulostriate distribution, right thalamus, and likely left upper putamen. Remote small vessel infarcts in the left parietal white matter and upper left cerebellum. Cerebral volume loss with ventriculomegaly, stable. Vascular: Atherosclerotic calcification.  No hyperdense vessel. Skull: No acute or aggressive finding. Sinuses/Orbits: Negative IMPRESSION: 1. No acute finding or change from 2017. 2. 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 Electronically Signed   By: Monte Fantasia M.D.   On: 05/11/2017 15:38    Pending Labs Unresulted Labs    Start     Ordered   05/11/17 1752  Creatinine, urine, random  Add-on,   R     05/11/17 1752   05/11/17 1752  Sodium, urine, random  Add-on,   R     05/11/17 1752   05/11/17 1459  Urine culture  STAT,   STAT     05/11/17 1459   Signed and Held  CBC  (heparin)  Once,   R    Comments:  Baseline for heparin therapy IF NOT ALREADY DRAWN.  Notify MD  if PLT < 100 K.    Signed and Held   Signed and Held  Creatinine, serum  (heparin)  Once,   R    Comments:  Baseline for heparin therapy IF NOT ALREADY DRAWN.    Signed and Held   Signed and Held  CBC  Tomorrow morning,   R     Signed and Held   Signed and Held  Basic metabolic panel  Tomorrow morning,   R     Signed and Held      Vitals/Pain Today's Vitals   05/11/17 1545 05/11/17 1802 05/11/17 1919 05/11/17 1923  BP: 128/70 (!) 112/53 113/69   Pulse: 81 97 (!) 108   Resp: 14 19 18    Temp:   (!) 97.5 F (36.4 C) 97.6 F (36.4 C)  TempSrc:   Oral Axillary  SpO2: 98% 95% 98%   Weight:      Height:        Isolation Precautions No active isolations  Medications Medications  sodium chloride 0.9 % bolus 1,000 mL (not administered)  sodium chloride 0.9 % bolus 1,000 mL (0 mLs Intravenous Stopped 05/11/17 1909)  sodium chloride 0.9 % bolus 1,000 mL (1,000 mLs Intravenous New Bag/Given 05/11/17 1909)    Mobility walks with device

## 2017-05-11 NOTE — ED Notes (Signed)
ED Provider at bedside. 

## 2017-05-11 NOTE — ED Notes (Signed)
Patient complaining she "wants to leave and does not want to be at the hospital". Stating "I am leaving now". RN made aware.

## 2017-05-11 NOTE — H&P (Signed)
History and Physical    Danielle [name] MSX:[id number] DOB: 1945/11/22 DOA: 05/11/2017  PCP: Patient, No Pcp Per  Patient coming from: home  I have personally briefly reviewed patient's old medical records in [facility]  Chief Complaint: Weakness  HPI: Danielle [name] is a 71 y.o. female with medical history significant of past medical history of intracranial hemorrhage back in 2015, hyperlipidemia and hypertension that comes in for lower extremity weakness bilaterally distorted on the day prior to admission. At this point patient cannot provide a good history she slow to respond, and all she says is that she doesn't want to go back to the nursing home. As per chart she was sent here for lower extremity weakness, the staff at the facility checked her blood pressure and reports of blood pressure of 70 with no diastolic measurements, when EMS got there her blood pressure was 110/68, the status post radiation has been sleeping a lot about wanting to eat or move around. In route EMS given 250 mL bolus.  ED Course:  In the ED she was found mildly tachycardic with a blood pressure 120/70, mild hyponatremia and acute renal failure of 2.1 (with a baseline creatinine of less than one), leukocytosis of 16.5 with a left shift and thrombocytosis Chest x-ray CT of the head are unremarkable, EKG as below Review of Systems: As per HPI otherwise 10 point review of systems negative.    Past Medical History:  Diagnosis Date  . Anemia   . Anxiety   . Arthritis    "some; all over" (05/29/2014)  . Asthma   . Chronic airway obstruction ([facility])   . COPD (chronic obstructive pulmonary disease) ([facility])   . Dementia   . Depression   . Encephalopathy   . Esophagitis   . Essential hypertension   . GERD (gastroesophageal reflux disease)   . GIB (gastrointestinal bleeding)   . HLD (hyperlipidemia)   . Hypopotassemia   . Hyposmolality and/or hyponatremia   . Insomnia   . Tobacco abuse     Past Surgical  History:  Procedure Laterality Date  . ABDOMINAL HYSTERECTOMY    . APPENDECTOMY    . CHOLECYSTECTOMY    . TONSILLECTOMY    . TUBAL LIGATION       reports that she has been smoking Cigarettes.  She has a 12.50 pack-year smoking history. She has never used smokeless tobacco. She reports that she does not drink alcohol or use drugs.  No Known Allergies  Family History  Problem Relation Age of Onset  . CVA Mother     Prior to Admission medications   Medication Sig Start Date End Date Taking? Authorizing Provider  acetaminophen (TYLENOL) 325 MG tablet Take 650 mg by mouth 3 (three) times daily. Scheduled   Yes [provider]  albuterol (PROVENTIL HFA;VENTOLIN HFA) 108 (90 Base) MCG/ACT inhaler Inhale 2 puffs into the lungs daily as needed for wheezing or shortness of breath.    Yes [provider]  atorvastatin (LIPITOR) 10 MG tablet Take 10 mg by mouth at bedtime.   Yes [provider]  budesonide-formoterol (SYMBICORT) 160-4.5 MCG/ACT inhaler Inhale 2 puffs into the lungs 2 (two) times daily.   Yes [provider]  diazepam (VALIUM) 5 MG tablet Take 5 mg by mouth 3 (three) times daily.    Yes [provider]  iron polysaccharides (FERREX 150) 150 MG capsule Take 150 mg by mouth daily with breakfast.   Yes [provider]  lisinopril (PRINIVIL,ZESTRIL) 10 MG  tablet Take 10 mg by mouth daily with breakfast.   Yes [provider]  loperamide (IMODIUM) 2 MG capsule Take 2 mg by mouth 4 (four) times daily as needed for diarrhea or loose stools.   Yes [provider]  metoprolol tartrate (LOPRESSOR) 25 MG tablet Take 25 mg by mouth 2 (two) times daily.   Yes [provider]  nystatin (NYSTATIN) powder Apply 1 g topically 2 (two) times daily as needed (for rash).   Yes [provider]  oxyCODONE (OXY IR/ROXICODONE) 5 MG immediate release tablet Take 5 mg by mouth 4 (four) times daily.   Yes [provider]  predniSONE (DELTASONE) 2.5 MG tablet Take 2.5 mg by mouth daily with breakfast.   Yes [provider]  QUEtiapine (SEROQUEL) 25 MG tablet Take 25 mg by mouth 2 (two) times daily.   Yes [provider]  tiotropium (SPIRIVA) 18 MCG inhalation capsule Place 18 mcg into inhaler and inhale daily.   Yes [provider]  trolamine salicylate (ARTHRICREAM) 10 % cream Apply 1 application topically 2 (two) times daily as needed for muscle pain. May self administer   Yes [provider]  venlafaxine (EFFEXOR) 75 MG tablet Take 75 mg by mouth 2 (two) times daily.   Yes [provider]  vitamin B-12 (CYANOCOBALAMIN) 500 MCG tablet Take 500 mcg by mouth daily with breakfast.    Yes [provider]    Physical Exam: Vitals:   05/11/17 1337 05/11/17 1351 05/11/17 1352 05/11/17 1545  BP:  113/61  128/70  Pulse:  78  81  Resp:  20  14  Temp:  97.8 F (36.6 C)    TempSrc:  Oral    SpO2: 97% 99%  98%  Weight:   72.6 kg (160 lb)   Height:   5\' 6"  (1.676 m)     Constitutional: No acute distress, lying comfortably in bed. Vitals:   05/11/17 1337 05/11/17 1351 05/11/17 1352 05/11/17 1545  BP:  113/61  128/70  Pulse:  78  81  Resp:  20  14  Temp:  97.8 F (36.6 C)    TempSrc:  Oral    SpO2: 97% 99%  98%  Weight:   72.6 kg (160 lb)   Height:   5\' 6"  (1.676 m)    Eyes: PERRL, lids and conjunctivae normal ENMT: Dry mucous membranes with poor dentition. Neck: Neck is supple no thyromegaly no JVD. Respiratory: She is good air movement clear to auscultation. Cardiovascular: Shows regular rate and rhythm with positive S1-S2 negative JVD Abdomen: Abdomen is soft and nontender nondistended. Tenderness no CVA.  Musculoskeletal: No clubbing cyanosis or lower extremity edema she is moving all 4 extremities without any difficulties. Skin: No rashes or ulceration. Neurologic: Awake alert and oriented 2 through 12 are grossly intact sensation  is intact throughout muscle strength 5-04 extremities deep tendon reflexes 2+ bilaterally symmetrical finger-to-nose is normal. Labs on Admission: I have personally reviewed following labs and imaging studies  CBC:  Recent Labs Lab 05/11/17 1426  WBC 16.5*  NEUTROABS 14.0*  HGB 10.5*  HCT 31.9*  MCV 97.3  PLT 461*   Basic Metabolic Panel:  Recent Labs Lab 05/11/17 1426  NA 130*  K 4.1  CL 105  CO2 16*  GLUCOSE 130*  BUN 38*  CREATININE 2.14*  CALCIUM 8.2*   GFR: Estimated Creatinine Clearance: 25.3 mL/min (A) (by C-G formula based on SCr of 2.14 mg/dL (H)). Liver Function Tests:  Recent  Labs Lab 05/11/17 1426  AST 40  ALT 34  ALKPHOS 222*  BILITOT 0.3  PROT 6.0*  ALBUMIN 2.8*    Recent Labs Lab 05/11/17 1426  LIPASE 21   No results for input(s): AMMONIA in the last 168 hours. Coagulation Profile:  Recent Labs Lab 05/11/17 1426  INR 1.09   Cardiac Enzymes: No results for input(s): CKTOTAL, CKMB, CKMBINDEX, TROPONINI in the last 168 hours. BNP (last 3 results) No results for input(s): PROBNP in the last 8760 hours. HbA1C: No results for input(s): HGBA1C in the last 72 hours. CBG: No results for input(s): GLUCAP in the last 168 hours. Lipid Profile: No results for input(s): CHOL, HDL, LDLCALC, TRIG, CHOLHDL, LDLDIRECT in the last 72 hours. Thyroid Function Tests: No results for input(s): TSH, T4TOTAL, FREET4, T3FREE, THYROIDAB in the last 72 hours. Anemia Panel: No results for input(s): VITAMINB12, FOLATE, FERRITIN, TIBC, IRON, RETICCTPCT in the last 72 hours. Urine analysis:    Component Value Date/Time   COLORURINE YELLOW 05/11/2017 1459   APPEARANCEUR CLEAR 05/11/2017 1459   LABSPEC 1.017 05/11/2017 1459   PHURINE 5.0 05/11/2017 1459   GLUCOSEU NEGATIVE 05/11/2017 1459   HGBUR SMALL (A) 05/11/2017 1459   BILIRUBINUR NEGATIVE 05/11/2017 1459   KETONESUR NEGATIVE 05/11/2017 1459   PROTEINUR NEGATIVE 05/11/2017 1459   UROBILINOGEN 0.2  05/25/2014 1855   NITRITE NEGATIVE 05/11/2017 1459   LEUKOCYTESUR NEGATIVE 05/11/2017 1459    Radiological Exams on Admission: Dg Chest 2 View  Result Date: 05/11/2017 CLINICAL DATA:  Cough, fatigue, and hypotension. EXAM: CHEST  2 VIEW COMPARISON:  Chest x-ray dated 04/06/2015 and chest CT dated 04/07/2015 FINDINGS: Heart size and pulmonary vascularity are normal. There is slight scarring as well as a prominent left apical pericardial fat pad at the left lung base laterally. No effusions or infiltrates. Multiple old compression fractures in the upper and midthoracic spine. No acute fractures. Aortic atherosclerosis. IMPRESSION: No active cardiopulmonary disease. Electronically Signed   By: Francene Boyers M.D.   On: 05/11/2017 15:24   Ct Head Wo Contrast  Result Date: 05/11/2017 CLINICAL DATA:  Hypotension. Sleeping a lot. Not feeling well. Focal neural deficit greater than 6 hours (deficit not specified). EXAM: CT HEAD WITHOUT CONTRAST TECHNIQUE: Contiguous axial images were obtained from the base of the skull through the vertex without intravenous contrast. COMPARISON:  02/14/2016 FINDINGS: Brain: No evidence of acute infarction, hemorrhage, hydrocephalus, extra-axial collection or mass lesion/mass effect. Remote small vessel infarcts in the right lateral lenticulostriate distribution, right thalamus, and likely left upper putamen. Remote small vessel infarcts in the left parietal white matter and upper left cerebellum. Cerebral volume loss with ventriculomegaly, stable. Vascular: Atherosclerotic calcification.  No hyperdense vessel. Skull: No acute or aggressive finding. Sinuses/Orbits: Negative IMPRESSION: 1. No acute finding or change from 2017. 2. 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 Electronically Signed   By: Marnee Spring M.D.   On: 05/11/2017 15:38    EKG: Independently reviewed. Normal sinus rhythm normal axis and nonspecific T-wave  changes.  Assessment/Plan AKI (acute kidney injury) [facility]): Baseline Cr < 1.0. We'll hold all antihypertensive medication, acute renal failure in the setting of ACE inhibitor. Likely prerenal in etiology we'll check urinary sodium urinary creatinine. To calculate her fractional excretion of sodium. She has not received contrast recently, no new medications she is on no medications to my knowledge that can affect her renal function. No changes in her medication.   Lower extremity weakness: ED physician is suspicious for an MRI, I  will see any focal deficit on physical exam. To rule out a CVA. CT scan of the head showed no acute findings unchanged from previous.    Hyponatremia Likely prerenal in etiology, was started on aggressive IV fluid hydration and recheck a basic metabolic panel in the morning.    Acute encephalopathy Slow speech but nonfocal on physical exam, this likely due to acute renal failure,  narcotics and/or hyponatremia. We'll start her on aggressive IV fluid hydration and reevaluate in the morning. NPO for now and re-evaluate in the morning.  Leukocytosis Likely stress demargination, she has remained afebrile, she denies any chest pain shortness of breath she has no cuts or sores on her skin she has poor dentition. I will not start empiric antibiotics at this point. Will monitor fever curve Blood cultures and start empiric antibiotics if she develops spikes a temperature.  Non-anion gap Metabolic acidosis Anion gap is less than 12, question if this due to acute renal failure. Start normal saline and recheck in the morning.  DVT prophylaxis: lovenox Code Status: full Family Communication: none Disposition Plan: unabel to determine Consults called: none Admission status: inpatient   Marinda Elk MD [facility] Pager [phone]  If 7PM-7AM, please contact night-coverage [email or web address] Password [email or web address]  05/11/2017, 5:50 PM

## 2017-05-11 NOTE — ED Triage Notes (Addendum)
Per [facility] Pt resides at [facility]. Full Code. Staff reports hypotension 70"s. EMS 110/68 manual. Staff reports "sleeping a lot this weekend. Not going to dining hall as she usually does". Pt reports just not feeling well". Denies N/V/D and fever. Denies any pain. 250 Bolus NS given in route. NEGATIVE ORTHOSTATICS. NEG STROKE

## 2017-05-12 DIAGNOSIS — E8729 Other acidosis: Secondary | ICD-10-CM

## 2017-05-12 DIAGNOSIS — E872 Acidosis: Secondary | ICD-10-CM

## 2017-05-12 LAB — URINE CULTURE: Culture: NO GROWTH

## 2017-05-12 LAB — BASIC METABOLIC PANEL
ANION GAP: 10 (ref 5–15)
BUN: 26 mg/dL — ABNORMAL HIGH (ref 6–20)
CHLORIDE: 113 mmol/L — AB (ref 101–111)
CO2: 14 mmol/L — ABNORMAL LOW (ref 22–32)
CREATININE: 1.07 mg/dL — AB (ref 0.44–1.00)
Calcium: 8.1 mg/dL — ABNORMAL LOW (ref 8.9–10.3)
GFR calc non Af Amer: 52 mL/min — ABNORMAL LOW (ref >60)
GFR, EST AFRICAN AMERICAN: 60 mL/min — AB (ref >60)
Glucose, Bld: 85 mg/dL (ref 65–99)
POTASSIUM: 3.7 mmol/L (ref 3.5–5.1)
SODIUM: 137 mmol/L (ref 135–145)

## 2017-05-12 LAB — CBC
HCT: 30.1 % — ABNORMAL LOW (ref 36.0–46.0)
HEMOGLOBIN: 10 g/dL — AB (ref 12.0–15.0)
MCH: 32.5 pg (ref 26.0–34.0)
MCHC: 33.2 g/dL (ref 30.0–36.0)
MCV: 97.7 fL (ref 78.0–100.0)
Platelets: 428 10*3/uL — ABNORMAL HIGH (ref 150–400)
RBC: 3.08 MIL/uL — AB (ref 3.87–5.11)
RDW: 14.4 % (ref 11.5–15.5)
WBC: 14.4 10*3/uL — AB (ref 4.0–10.5)

## 2017-05-12 LAB — MRSA PCR SCREENING: MRSA BY PCR: NEGATIVE

## 2017-05-12 NOTE — Progress Notes (Signed)
[facility] PROGRESS NOTE    Progress Note  Danielle [name]  ZOX:[id number] DOB: 12/22/1945 DOA: 05/11/2017 PCP: Patient, No Pcp Per     Brief Narrative:   Danielle [name] is an 71 y.o. female past medical history significant for intracranial hemorrhage back in 2015, hyperlipidemia hypertension comes in for bilateral lower extremity weakness that started several days prior to admission.  Assessment/Plan:   AKI (acute kidney injury) (HCC): Baseline creatinine of less than 1, she was on ACE inhibitor prior to admission. Creatinine now at  Baseline resolved with IV fluid hydration. Physical therapy consult. Fractional excretion of sodium was less than 1.  Nonionic gap metabolic acidosis: Hold V fluids and check a basic metabolic panel in the morning. Her chloride worsen with IV fluids. She's not having any diarrhea, interferential a also be due to acute renal failure as her baseline creatinine is less than 1, question renal tubular acidosis. KVO IV fluids check a basic metabolic panel in the morning.  Hyponatremia: Resolve with IV fluid hydration.  Acute encephalopathy No improve multifactorial, exiting of hyponatremia and acute renal failure and probably narcotics. Speech has normalized she is able to turn on a conversation. We'll change her diet.  Leukocytosis leukocytosis continues to improve, she has remained afebrile likely due to stressed emargination.  Lower extremity weakness Physical therapy has been consulted. MRI showed no acute CVA.   DVT prophylaxis: lovenox Family Communication:none Disposition Plan/Barrier to D/C: SNF in am Code Status:     Code Status Orders        Start     Ordered   05/11/17 2104  Full code  Continuous     05/11/17 2103    Code Status History    Date Active Date Inactive Code Status Order ID Comments User Context   05/26/2014  1:15 AM 05/30/2014  4:52 PM Full Code [id number]  Kathlene Cote, PA-C Inpatient    Advance  Directive Documentation     Most Recent Value  Type of Advance Directive  Living will  Pre-existing out of facility DNR order (yellow form or pink MOST form)  -  "MOST" Form in Place?  -        IV Access:    Peripheral IV   Procedures and diagnostic studies:   Dg Chest 2 View  Result Date: 05/11/2017 CLINICAL DATA:  Cough, fatigue, and hypotension. EXAM: CHEST  2 VIEW COMPARISON:  Chest x-ray dated 04/06/2015 and chest CT dated 04/07/2015 FINDINGS: Heart size and pulmonary vascularity are normal. There is slight scarring as well as a prominent left apical pericardial fat pad at the left lung base laterally. No effusions or infiltrates. Multiple old compression fractures in the upper and midthoracic spine. No acute fractures. Aortic atherosclerosis. IMPRESSION: No active cardiopulmonary disease. Electronically Signed   By: Francene Boyers M.D.   On: 05/11/2017 15:24   Ct Head Wo Contrast  Result Date: 05/11/2017 CLINICAL DATA:  Hypotension. Sleeping a lot. Not feeling well. Focal neural deficit greater than 6 hours (deficit not specified). EXAM: CT HEAD WITHOUT CONTRAST TECHNIQUE: Contiguous axial images were obtained from the base of the skull through the vertex without intravenous contrast. COMPARISON:  02/14/2016 FINDINGS: Brain: No evidence of acute infarction, hemorrhage, hydrocephalus, extra-axial collection or mass lesion/mass effect. Remote small vessel infarcts in the right lateral lenticulostriate distribution, right thalamus, and likely left upper putamen. Remote small vessel infarcts in the left parietal white matter and upper left cerebellum. Cerebral volume loss with ventriculomegaly, stable. Vascular: Atherosclerotic calcification.  No hyperdense vessel. Skull: No acute or aggressive finding. Sinuses/Orbits: Negative IMPRESSION: 1. No acute finding or change from 2017. 2. 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 Electronically Signed   By: Marnee Spring  M.D.   On: 05/11/2017 15:38   Mr Brain Wo Contrast  Result Date: 05/11/2017 CLINICAL DATA:  Lower extremity weakness and hypotension. EXAM: MRI HEAD WITHOUT CONTRAST TECHNIQUE: Multiplanar, multiecho pulse sequences of the brain and surrounding structures were obtained without intravenous contrast. COMPARISON:  Head CT 05/11/2017 Brain MRI 02/09/2016 FINDINGS: The study is degraded by motion, despite efforts to reduce this artifact, including utilization of motion-resistant MR sequences. The findings of the study are interpreted in the context of reduced sensitivity/specificity. Brain: The midline structures are normal. There is no focal diffusion restriction to indicate acute infarct. Multiple bilateral basal ganglia old lacunar infarcts. No intraparenchymal hematoma or chronic microhemorrhage. Atrophy is advanced for age. The dura is normal and there is no extra-axial collection. Vascular: Major intracranial arterial and venous sinus flow voids are preserved. Skull and upper cervical spine: The visualized skull base, calvarium, upper cervical spine and extracranial soft tissues are normal. Sinuses/Orbits: No fluid levels or advanced mucosal thickening. No mastoid or middle ear effusion. Normal orbits. IMPRESSION: 1. Markedly motion degraded examination. Within that limitation, no acute abnormality. 2. Findings of chronic microvascular ischemia multiple old basal ganglia lacunar infarcts. 3. Age advanced atrophy. Electronically Signed   By: Deatra Robinson M.D.   On: 05/11/2017 20:44     Medical Consultants:    None.  Anti-Infectives:   None  Subjective:    Danielle [name] she has no new complaints.  Objective:    Vitals:   05/11/17 1919 05/11/17 1923 05/11/17 2134 05/12/17 0519  BP: 113/69  (!) 134/58 (!) 107/45  Pulse: (!) 108  (!) 118 97  Resp: 18  18 18   Temp: (!) 97.5 F (36.4 C) 97.6 F (36.4 C) 97.8 F (36.6 C) 99.3 F (37.4 C)  TempSrc: Oral Axillary Oral Oral  SpO2: 98%   99% 99%  Weight:   79.5 kg (175 lb 4.3 oz)   Height:   5\' 5"  (1.651 m)     Intake/Output Summary (Last 24 hours) at 05/12/17 0829 Last data filed at 05/12/17 0600  Gross per 24 hour  Intake          2188.75 ml  Output              700 ml  Net          1488.75 ml   Filed Weights   05/11/17 1352 05/11/17 2134  Weight: 72.6 kg (160 lb) 79.5 kg (175 lb 4.3 oz)    Exam: General exam: In no acute distress. Respiratory system: Good air movement clear to auscultation. Cardiovascular system: Regular in rhythm with positive S1-S2 Gastrointestinal system: Positive bowel sounds soft nontender nondistended. Central nervous system: She is awake alert and oriented 3. Extremities: No lower extremity edema. Skin: No rashes. Psychiatry: Judgment and insight appear normal.   Data Reviewed:    Labs: Basic Metabolic Panel:  Recent Labs Lab 05/11/17 1426 05/12/17 0508  NA 130* 137  K 4.1 3.7  CL 105 113*  CO2 16* 14*  GLUCOSE 130* 85  BUN 38* 26*  CREATININE 2.14* 1.07*  CALCIUM 8.2* 8.1*   GFR Estimated Creatinine Clearance: 51.7 mL/min (A) (by C-G formula based on SCr of 1.07 mg/dL (H)). Liver Function Tests:  Recent Labs Lab 05/11/17 1426  AST 40  ALT 34  ALKPHOS 222*  BILITOT 0.3  PROT 6.0*  ALBUMIN 2.8*    Recent Labs Lab 05/11/17 1426  LIPASE 21   No results for input(s): AMMONIA in the last 168 hours. Coagulation profile  Recent Labs Lab 05/11/17 1426  INR 1.09    CBC:  Recent Labs Lab 05/11/17 1426 05/12/17 0508  WBC 16.5* 14.4*  NEUTROABS 14.0*  --   HGB 10.5* 10.0*  HCT 31.9* 30.1*  MCV 97.3 97.7  PLT 461* 428*   Cardiac Enzymes: No results for input(s): CKTOTAL, CKMB, CKMBINDEX, TROPONINI in the last 168 hours. BNP (last 3 results) No results for input(s): PROBNP in the last 8760 hours. CBG: No results for input(s): GLUCAP in the last 168 hours. D-Dimer: No results for input(s): DDIMER in the last 72 hours. Hgb A1c: No results  for input(s): HGBA1C in the last 72 hours. Lipid Profile: No results for input(s): CHOL, HDL, LDLCALC, TRIG, CHOLHDL, LDLDIRECT in the last 72 hours. Thyroid function studies: No results for input(s): TSH, T4TOTAL, T3FREE, THYROIDAB in the last 72 hours.  Invalid input(s): FREET3 Anemia work up: No results for input(s): VITAMINB12, FOLATE, FERRITIN, TIBC, IRON, RETICCTPCT in the last 72 hours. Sepsis Labs:  Recent Labs Lab 05/11/17 1426 05/11/17 1614 05/12/17 0508  WBC 16.5*  --  14.4*  LATICACIDVEN  --  1.09  --    Microbiology No results found for this or any previous visit (from the past 240 hour(s)).   Medications:   . atorvastatin  10 mg Oral QHS  . diazepam  5 mg Oral TID  . heparin  5,000 Units Subcutaneous Q8H  . iron polysaccharides  150 mg Oral Q breakfast  . metoprolol tartrate  25 mg Oral BID  . mometasone-formoterol  2 puff Inhalation BID  . oxyCODONE  5 mg Oral QID  . polyethylene glycol  17 g Oral Daily  . predniSONE  2.5 mg Oral Q breakfast  . QUEtiapine  25 mg Oral BID  . tiotropium  18 mcg Inhalation Daily  . venlafaxine  75 mg Oral BID   Continuous Infusions: . sodium chloride 75 mL/hr at 05/11/17 2137  . sodium chloride        LOS: 1 day   Marinda Elk  [facility] Pager [phone]  *Please refer to [email or web address], password [email or web address] to get updated schedule on who will round on this patient, as hospitalists switch teams weekly. If 7PM-7AM, please contact night-coverage at [email or web address], password [email or web address] for any overnight needs.  05/12/2017, 8:29 AM

## 2017-05-12 NOTE — Care Management Note (Signed)
Case Management Note  Patient Details  Name: Danielle [name] MRN: [id number] Date of Birth: Feb 20, 1946  Subjective/Objective: 71 y/o f admitted w/AKI. From ALF-[facility]. Hx: dementia.PT cons-await recc. CSW already following.                   Action/Plan:d/c ALF   Expected Discharge Date:   (unknown)               Expected Discharge Plan:  Assisted Living / Rest Home  In-House Referral:  Clinical Social Work  Discharge planning Services  CM Consult  Post Acute Care Choice:    Choice offered to:     DME Arranged:    DME Agency:     HH Arranged:    HH Agency:     Status of Service:  In process, will continue to follow  If discussed at Long Length of Stay Meetings, dates discussed:    Additional Comments:  Lanier Clam, RN 05/12/2017, 1:15 PM

## 2017-05-12 NOTE — Care Management Note (Signed)
Case Management Note  Patient Details  Name: Abrial Dellapenna MRN: [id number] Date of Birth: May 20, 1946  Subjective/Objective: Received call from [facility] rep [place]-patient active w/HHRN while @ ALF.Await PT cons-recc.CSW already following.                   Action/Plan:d/c ALF   Expected Discharge Date:   (unknown)               Expected Discharge Plan:  Assisted Living / Rest Home  In-House Referral:  Clinical Social Work  Discharge planning Services  CM Consult  Post Acute Care Choice:  Home Health (Active w/[facility]-HHRN) Choice offered to:     DME Arranged:    DME Agency:     HH Arranged:    HH Agency:     Status of Service:  In process, will continue to follow  If discussed at Long Length of Stay Meetings, dates discussed:    Additional Comments:  Lanier Clam, RN 05/12/2017, 3:26 PM

## 2017-05-13 ENCOUNTER — Inpatient Hospital Stay (HOSPITAL_COMMUNITY): Payer: Medicare Other

## 2017-05-13 LAB — BASIC METABOLIC PANEL
Anion gap: 8 (ref 5–15)
BUN: 17 mg/dL (ref 6–20)
CALCIUM: 8.7 mg/dL — AB (ref 8.9–10.3)
CO2: 19 mmol/L — ABNORMAL LOW (ref 22–32)
CREATININE: 1.01 mg/dL — AB (ref 0.44–1.00)
Chloride: 110 mmol/L (ref 101–111)
GFR calc non Af Amer: 55 mL/min — ABNORMAL LOW (ref >60)
Glucose, Bld: 170 mg/dL — ABNORMAL HIGH (ref 65–99)
Potassium: 3.9 mmol/L (ref 3.5–5.1)
SODIUM: 137 mmol/L (ref 135–145)

## 2017-05-13 LAB — CBC
HCT: 33.3 % — ABNORMAL LOW (ref 36.0–46.0)
Hemoglobin: 10.9 g/dL — ABNORMAL LOW (ref 12.0–15.0)
MCH: 31.3 pg (ref 26.0–34.0)
MCHC: 32.7 g/dL (ref 30.0–36.0)
MCV: 95.7 fL (ref 78.0–100.0)
PLATELETS: 582 10*3/uL — AB (ref 150–400)
RBC: 3.48 MIL/uL — AB (ref 3.87–5.11)
RDW: 14.5 % (ref 11.5–15.5)
WBC: 24.1 10*3/uL — AB (ref 4.0–10.5)

## 2017-05-13 LAB — BLOOD GAS, ARTERIAL
Acid-base deficit: 6.8 mmol/L — ABNORMAL HIGH (ref 0.0–2.0)
BICARBONATE: 17.1 mmol/L — AB (ref 20.0–28.0)
Drawn by: 331471
O2 Saturation: 93 %
PATIENT TEMPERATURE: 98.6
pCO2 arterial: 30.6 mmHg — ABNORMAL LOW (ref 32.0–48.0)
pH, Arterial: 7.367 (ref 7.350–7.450)
pO2, Arterial: 66.2 mmHg — ABNORMAL LOW (ref 83.0–108.0)

## 2017-05-13 LAB — PROCALCITONIN: Procalcitonin: 17.52 ng/mL

## 2017-05-13 MED ORDER — LEVALBUTEROL HCL 1.25 MG/0.5ML IN NEBU
1.2500 mg | INHALATION_SOLUTION | Freq: Four times a day (QID) | RESPIRATORY_TRACT | Status: DC | PRN
  Filled 2017-05-13: qty 0.5

## 2017-05-13 MED ORDER — OXYCODONE HCL 5 MG PO TABS
5.0000 mg | ORAL_TABLET | Freq: Four times a day (QID) | ORAL | Status: DC | PRN
  Administered 2017-05-14 – 2017-05-18 (×11): 5 mg via ORAL
  Filled 2017-05-13 (×11): qty 1

## 2017-05-13 MED ORDER — DIAZEPAM 5 MG PO TABS
5.0000 mg | ORAL_TABLET | Freq: Three times a day (TID) | ORAL | Status: DC | PRN
  Administered 2017-05-14 – 2017-05-16 (×4): 5 mg via ORAL
  Administered 2017-05-17: 2 mg via ORAL
  Administered 2017-05-17 – 2017-05-19 (×6): 5 mg via ORAL
  Filled 2017-05-13 (×10): qty 1

## 2017-05-13 NOTE — Progress Notes (Signed)
PROGRESS NOTE    Danielle [name]  ZOX:[id number] DOB: Jan 20, 1946 DOA: 05/11/2017 PCP: Patient, No Pcp Per   Brief Narrative: Danielle [name] is a 71 y.o. chemotherapy history of intracranial hemorrhage, hyperlipidemia, hypertension, COPD. She presents with lower extremity weakness and was found to have acute encephalopathy in addition to metabolic acidosis and AKI   Assessment & Plan:   Active Problems:   Hyponatremia   Acute encephalopathy   AKI (acute kidney injury) (HCC)   Leukocytosis   Metabolic acidosis   Lower extremity weakness   Hyperchloremic metabolic acidosis   Acute encephalopathy Somnolent. Unknown baseline. CT head unremarkable. MRI motion degraded and without acute process. Patient with a significant amount of sedating mediations. -adjust medication regimen -ABG  Acute kidney injury Improved. Initially received fluids.  Non-anion gap metabolic acidosis Likely secondary to AKI. Improved. -repeat BMP  Tachypnea Likely secondary to acute metabolic acidosis (without elevated anion gap). Differential includes PE, pneumonia, pneumothorax, etc. -Chest x-ray -ABG  Hyponatremia Resolved with IV fluids.  Leukocytosis Worsened and in the setting of tachypnea and mental status change. Afebrile. No nuchal rigidity. Urine culture -blood culture -procalcitonin  Lower extremity weakness -PT recommending SNF -Blood cultures  Mood disorder -Switch Valium to PRN -discontinue Seroquel  Hyperlipidemia -continue Lipitor  Essential hypertension Stable. -continue metoprolol  COPD Stable. No exacerbation -continue Spiriva -continue Xopenex -continue chronic prednisone   DVT prophylaxis: Heparin Code Status: Full code Family Communication: None at bedside Disposition Plan: Discharge in several days   Consultants:   None  Procedures:   None  Antimicrobials:  None    Subjective: Afebrile.  Objective: Vitals:   05/13/17 0510 05/13/17  0839 05/13/17 1018 05/13/17 1248  BP: 122/61 (!) 110/50  104/66  Pulse: (!) 117 (!) 120 (!) 104 (!) 109  Resp: 18   19  Temp: 98.2 F (36.8 C)   98.2 F (36.8 C)  TempSrc: Oral   Oral  SpO2: 98%  98% 98%  Weight:      Height:        Intake/Output Summary (Last 24 hours) at 05/13/17 1343 Last data filed at 05/13/17 1246  Gross per 24 hour  Intake              300 ml  Output                0 ml  Net              300 ml   Filed Weights   05/11/17 1352 05/11/17 2134  Weight: 72.6 kg (160 lb) 79.5 kg (175 lb 4.3 oz)    Examination:  General exam: Appears calm and comfortable Respiratory system: Decreased breath sounds on auscultation bilaterally. Mild expiratory wheeze. Tachypnea. Cardiovascular system: S1 & S2 heard, RRR. Harsh systolic murmur. Gastrointestinal system: Abdomen is nondistended, soft and nontender. Normal bowel sounds heard. Central nervous system: Somnolent. Could not assess orientation as patient would not willingly answer my questions. No focal neurological deficits. Extremities: No edema. No calf tenderness Skin: No cyanosis. No rashes Psychiatry: Judgement and insight appear Impaired. Mood & affect depressed and flat.     Data Reviewed: I have personally reviewed following labs and imaging studies  CBC:  Recent Labs Lab 05/11/17 1426 05/12/17 0508 05/13/17 1036  WBC 16.5* 14.4* 24.1*  NEUTROABS 14.0*  --   --   HGB 10.5* 10.0* 10.9*  HCT 31.9* 30.1* 33.3*  MCV 97.3 97.7 95.7  PLT 461* 428* 582*   Basic Metabolic Panel:  Recent  Labs Lab 05/11/17 1426 05/12/17 0508 05/13/17 1036  NA 130* 137 137  K 4.1 3.7 3.9  CL 105 113* 110  CO2 16* 14* 19*  GLUCOSE 130* 85 170*  BUN 38* 26* 17  CREATININE 2.14* 1.07* 1.01*  CALCIUM 8.2* 8.1* 8.7*   GFR: Estimated Creatinine Clearance: 54.8 mL/min (A) (by C-G formula based on SCr of 1.01 mg/dL (H)). Liver Function Tests:  Recent Labs Lab 05/11/17 1426  AST 40  ALT 34  ALKPHOS 222*    BILITOT 0.3  PROT 6.0*  ALBUMIN 2.8*    Recent Labs Lab 05/11/17 1426  LIPASE 21   No results for input(s): AMMONIA in the last 168 hours. Coagulation Profile:  Recent Labs Lab 05/11/17 1426  INR 1.09   Cardiac Enzymes: No results for input(s): CKTOTAL, CKMB, CKMBINDEX, TROPONINI in the last 168 hours. BNP (last 3 results) No results for input(s): PROBNP in the last 8760 hours. HbA1C: No results for input(s): HGBA1C in the last 72 hours. CBG: No results for input(s): GLUCAP in the last 168 hours. Lipid Profile: No results for input(s): CHOL, HDL, LDLCALC, TRIG, CHOLHDL, LDLDIRECT in the last 72 hours. Thyroid Function Tests: No results for input(s): TSH, T4TOTAL, FREET4, T3FREE, THYROIDAB in the last 72 hours. Anemia Panel: No results for input(s): VITAMINB12, FOLATE, FERRITIN, TIBC, IRON, RETICCTPCT in the last 72 hours. Sepsis Labs:  Recent Labs Lab 05/11/17 1614  LATICACIDVEN 1.09    Recent Results (from the past 240 hour(s))  Urine culture     Status: None   Collection Time: 05/11/17  2:59 PM  Result Value Ref Range Status   Specimen Description URINE, CLEAN CATCH  Final   Special Requests NONE  Final   Culture   Final    NO GROWTH Performed at [facility], [address] [place], [place] [address]    Report Status 05/12/2017 FINAL  Final  MRSA PCR Screening     Status: None   Collection Time: 05/12/17  5:33 AM  Result Value Ref Range Status   MRSA by PCR NEGATIVE NEGATIVE Final    Comment:        The GeneXpert MRSA Assay (FDA approved for NASAL specimens only), is one component of a comprehensive MRSA colonization surveillance program. It is not intended to diagnose MRSA infection nor to guide or monitor treatment for MRSA infections.          Radiology Studies: Dg Chest 2 View  Result Date: 05/11/2017 CLINICAL DATA:  Cough, fatigue, and hypotension. EXAM: CHEST  2 VIEW COMPARISON:  Chest x-ray dated 04/06/2015 and chest CT  dated 04/07/2015 FINDINGS: Heart size and pulmonary vascularity are normal. There is slight scarring as well as a prominent left apical pericardial fat pad at the left lung base laterally. No effusions or infiltrates. Multiple old compression fractures in the upper and midthoracic spine. No acute fractures. Aortic atherosclerosis. IMPRESSION: No active cardiopulmonary disease. Electronically Signed   By: Francene Boyers M.D.   On: 05/11/2017 15:24   Ct Head Wo Contrast  Result Date: 05/11/2017 CLINICAL DATA:  Hypotension. Sleeping a lot. Not feeling well. Focal neural deficit greater than 6 hours (deficit not specified). EXAM: CT HEAD WITHOUT CONTRAST TECHNIQUE: Contiguous axial images were obtained from the base of the skull through the vertex without intravenous contrast. COMPARISON:  02/14/2016 FINDINGS: Brain: No evidence of acute infarction, hemorrhage, hydrocephalus, extra-axial collection or mass lesion/mass effect. Remote small vessel infarcts in the right lateral lenticulostriate distribution, right thalamus, and  likely left upper putamen. Remote small vessel infarcts in the left parietal white matter and upper left cerebellum. Cerebral volume loss with ventriculomegaly, stable. Vascular: Atherosclerotic calcification.  No hyperdense vessel. Skull: No acute or aggressive finding. Sinuses/Orbits: Negative IMPRESSION: 1. No acute finding or change from 2017. 2. 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 Electronically Signed   By: Marnee Spring M.D.   On: 05/11/2017 15:38   Mr Brain Wo Contrast  Result Date: 05/11/2017 CLINICAL DATA:  Lower extremity weakness and hypotension. EXAM: MRI HEAD WITHOUT CONTRAST TECHNIQUE: Multiplanar, multiecho pulse sequences of the brain and surrounding structures were obtained without intravenous contrast. COMPARISON:  Head CT 05/11/2017 Brain MRI 02/09/2016 FINDINGS: The study is degraded by motion, despite efforts to reduce this artifact,  including utilization of motion-resistant MR sequences. The findings of the study are interpreted in the context of reduced sensitivity/specificity. Brain: The midline structures are normal. There is no focal diffusion restriction to indicate acute infarct. Multiple bilateral basal ganglia old lacunar infarcts. No intraparenchymal hematoma or chronic microhemorrhage. Atrophy is advanced for age. The dura is normal and there is no extra-axial collection. Vascular: Major intracranial arterial and venous sinus flow voids are preserved. Skull and upper cervical spine: The visualized skull base, calvarium, upper cervical spine and extracranial soft tissues are normal. Sinuses/Orbits: No fluid levels or advanced mucosal thickening. No mastoid or middle ear effusion. Normal orbits. IMPRESSION: 1. Markedly motion degraded examination. Within that limitation, no acute abnormality. 2. Findings of chronic microvascular ischemia multiple old basal ganglia lacunar infarcts. 3. Age advanced atrophy. Electronically Signed   By: Deatra Robinson M.D.   On: 05/11/2017 20:44   Dg Chest Port 1 View  Result Date: 05/13/2017 CLINICAL DATA:  Rapid breathing. EXAM: PORTABLE CHEST 1 VIEW COMPARISON:  05/11/2017.  04/05/2015. FINDINGS: Mediastinum and hilar structures normal. Cardiomegaly with normal pulmonary vascularity.Mild left base subsegmental atelectasis and/or scarring. IMPRESSION: Mild left base subsegmental atelectasis and/or scarring. Chest is stable from prior exam . Electronically Signed   By: Maisie Fus  Register   On: 05/13/2017 12:15        Scheduled Meds: . atorvastatin  10 mg Oral QHS  . diazepam  5 mg Oral TID  . heparin  5,000 Units Subcutaneous Q8H  . iron polysaccharides  150 mg Oral Q breakfast  . metoprolol tartrate  25 mg Oral BID  . mometasone-formoterol  2 puff Inhalation BID  . oxyCODONE  5 mg Oral QID  . polyethylene glycol  17 g Oral Daily  . predniSONE  2.5 mg Oral Q breakfast  . QUEtiapine  25  mg Oral BID  . tiotropium  18 mcg Inhalation Daily  . venlafaxine  75 mg Oral BID   Continuous Infusions: . sodium chloride       LOS: 2 days     Jacquelin Hawking, MD [facility] 05/13/2017, 1:43 PM Pager: [phone]  If 7PM-7AM, please contact night-coverage [email or web address] Password [facility] 05/13/2017, 1:43 PM

## 2017-05-13 NOTE — Progress Notes (Signed)
CSW assisting with dc planning. PN reviewed. PT has recommended SNF at dc. CSW reviewed recommendations with pt's son, [name] [phone]. Son would like pt to return to [facility] ALF. CSW has sent clinicals to ALF for review. Olivia Mackie, from ALF, had her NSG supervisor review clinicals and supervisor reports that pt's needs can be met at ALF level. Pt's son has been contacted and updated. He is pleased that pt is able to return to ALF at dc. CSW will continue to follow to assist with dc planning needs.  Werner Lean LCSW [phone]

## 2017-05-13 NOTE — Evaluation (Signed)
Physical Therapy Evaluation Patient Details Name: Danielle [name] MRN: [id number] DOB: 07-03-1946 Today's Date: 05/13/2017   History of Present Illness  71 y.o. female past medical history significant for intracranial hemorrhage back in 2015, hyperlipidemia hypertension comes in for bilateral lower extremity weakness that started several days prior to admission. Dx of AKI, metabolic encephalopathy.  Clinical Impression  Pt admitted with above diagnosis. Pt currently with functional limitations due to the deficits listed below (see PT Problem List). +2 assist for transfers. Pt quite lethargic and only oriented to self. Respiratory rate 36 at rest, SaO2 98% on RA, HR 104. She is from ALF, but will likely need SNF upon acute DC. Pt will benefit from skilled PT to increase their independence and safety with mobility to allow discharge to the venue listed below.        Follow Up Recommendations SNF;Supervision/Assistance - 24 hour    Equipment Recommendations  Other (comment) (TBD -may need WC if she doesn't have one)    Recommendations for Other Services       Precautions / Restrictions Precautions Precautions: Fall Restrictions Weight Bearing Restrictions: No      Mobility  Bed Mobility Overal bed mobility: Needs Assistance Bed Mobility: Supine to Sit     Supine to sit: Mod assist     General bed mobility comments: mod A to raise trunk and advance BLEs  Transfers Overall transfer level: Needs assistance Equipment used: Rolling walker (2 wheeled) Transfers: Sit to/from [employer] Sit to Stand: Mod assist;+2 safety/equipment Stand pivot transfers: Mod assist;+2 safety/equipment       General transfer comment: +2 for safety 2* pt lethargy/confusion; Mod A to rise, VCs for safety, hand placement and technique. SPT x 2 trials.   Ambulation/Gait                Stairs            Wheelchair Mobility    Modified Rankin (Stroke Patients  Only)       Balance Overall balance assessment: Needs assistance Sitting-balance support: Feet supported;Single extremity supported Sitting balance-Leahy Scale: Poor Sitting balance - Comments: min A for posterior lean Postural control: Posterior lean Standing balance support: Bilateral upper extremity supported Standing balance-Leahy Scale: Poor Standing balance comment: requires BUE support                             Pertinent Vitals/Pain Pain Assessment: Faces Faces Pain Scale: No hurt    Home Living Family/patient expects to be discharged to:: Skilled nursing facility                 Additional Comments: admitted from ALF, at present needs SNF    Prior Function           Comments: Unknown: pt unable to provide PLOF 2* confusion/lethargy     Hand Dominance        Extremity/Trunk Assessment   Upper Extremity Assessment Upper Extremity Assessment: Generalized weakness    Lower Extremity Assessment Lower Extremity Assessment: Generalized weakness    Cervical / Trunk Assessment Cervical / Trunk Assessment: Normal  Communication   Communication: No difficulties  Cognition Arousal/Alertness: Lethargic Behavior During Therapy: Flat affect Overall Cognitive Status: No family/caregiver present to determine baseline cognitive functioning                                 General Comments:  oriented to self only; stated she is in [place] and lived with her parents prior to admission (per chart she's from ALF)      General Comments      Exercises     Assessment/Plan    PT Assessment Patient needs continued PT services  PT Problem List Decreased strength;Decreased mobility;Decreased activity tolerance;Decreased balance;Decreased cognition;Cardiopulmonary status limiting activity;Decreased safety awareness       PT Treatment Interventions Gait training;Functional mobility training;Therapeutic activities;Therapeutic  exercise;Patient/family education;Balance training    PT Goals (Current goals can be found in the Care Plan section)  Acute Rehab PT Goals PT Goal Formulation: Patient unable to participate in goal setting Time For Goal Achievement: 05/27/17 Potential to Achieve Goals: Fair    Frequency Min 3X/week   Barriers to discharge        Co-evaluation               AM-PAC PT "6 Clicks" Daily Activity  Outcome Measure Difficulty turning over in bed (including adjusting bedclothes, sheets and blankets)?: Unable Difficulty moving from lying on back to sitting on the side of the bed? : Unable Difficulty sitting down on and standing up from a chair with arms (e.g., wheelchair, bedside commode, etc,.)?: Unable Help needed moving to and from a bed to chair (including a wheelchair)?: A Lot Help needed walking in hospital room?: Total Help needed climbing 3-5 steps with a railing? : Total 6 Click Score: 7    End of Session Equipment Utilized During Treatment: Gait belt Activity Tolerance: Patient limited by fatigue;Patient limited by lethargy Patient left: in chair;with call bell/phone within reach;with chair alarm set Nurse Communication: Mobility status PT Visit Diagnosis: Difficulty in walking, not elsewhere classified (R26.2)    Time: 5697-9480 PT Time Calculation (min) (ACUTE ONLY): 23 min   Charges:   PT Evaluation $PT Eval Moderate Complexity: 1 Mod PT Treatments $Therapeutic Activity: 8-22 mins   PT G Codes:          Tamala Ser 05/13/2017, 10:32 AM [phone]

## 2017-05-14 ENCOUNTER — Inpatient Hospital Stay (HOSPITAL_COMMUNITY): Payer: Medicare Other

## 2017-05-14 DIAGNOSIS — R0602 Shortness of breath: Secondary | ICD-10-CM

## 2017-05-14 LAB — BASIC METABOLIC PANEL
ANION GAP: 8 (ref 5–15)
BUN: 15 mg/dL (ref 6–20)
CALCIUM: 8 mg/dL — AB (ref 8.9–10.3)
CO2: 18 mmol/L — ABNORMAL LOW (ref 22–32)
Chloride: 109 mmol/L (ref 101–111)
Creatinine, Ser: 0.96 mg/dL (ref 0.44–1.00)
GFR calc Af Amer: >60 mL/min (ref >60)
GFR, EST NON AFRICAN AMERICAN: 59 mL/min — AB (ref >60)
Glucose, Bld: 138 mg/dL — ABNORMAL HIGH (ref 65–99)
POTASSIUM: 3.2 mmol/L — AB (ref 3.5–5.1)
SODIUM: 135 mmol/L (ref 135–145)

## 2017-05-14 LAB — CBC
HCT: 29 % — ABNORMAL LOW (ref 36.0–46.0)
Hemoglobin: 9.9 g/dL — ABNORMAL LOW (ref 12.0–15.0)
MCH: 32.5 pg (ref 26.0–34.0)
MCHC: 34.1 g/dL (ref 30.0–36.0)
MCV: 95.1 fL (ref 78.0–100.0)
PLATELETS: 534 10*3/uL — AB (ref 150–400)
RBC: 3.05 MIL/uL — ABNORMAL LOW (ref 3.87–5.11)
RDW: 14.2 % (ref 11.5–15.5)
WBC: 25.2 10*3/uL — ABNORMAL HIGH (ref 4.0–10.5)

## 2017-05-14 MED ORDER — IOPAMIDOL (ISOVUE-300) INJECTION 61%
INTRAVENOUS | Status: AC
  Administered 2017-05-14: 30 mL via ORAL
  Filled 2017-05-14: qty 30

## 2017-05-14 MED ORDER — IOPAMIDOL (ISOVUE-300) INJECTION 61%
30.0000 mL | Freq: Once | INTRAVENOUS | Status: AC | PRN
  Administered 2017-05-14: 30 mL via ORAL

## 2017-05-14 MED ORDER — POTASSIUM CHLORIDE CRYS ER 20 MEQ PO TBCR
40.0000 meq | EXTENDED_RELEASE_TABLET | Freq: Once | ORAL | Status: AC
  Administered 2017-05-14: 40 meq via ORAL
  Filled 2017-05-14: qty 2

## 2017-05-14 MED ORDER — IOPAMIDOL (ISOVUE-300) INJECTION 61%
INTRAVENOUS | Status: AC
  Administered 2017-05-14: 100 mL
  Filled 2017-05-14: qty 100

## 2017-05-14 NOTE — Progress Notes (Signed)
PROGRESS NOTE    Danielle [name]  ZOX:[id number] DOB: 04-Oct-1945 DOA: 05/11/2017 PCP: Patient, No Pcp Per   Brief Narrative: Danielle [name] is a 71 y.o. chemotherapy history of intracranial hemorrhage, hyperlipidemia, hypertension, COPD. She presents with lower extremity weakness and was found to have acute encephalopathy in addition to metabolic acidosis and AKI   Assessment & Plan:   Active Problems:   Hyponatremia   Acute encephalopathy   AKI (acute kidney injury) (HCC)   Leukocytosis   Metabolic acidosis   Lower extremity weakness   Hyperchloremic metabolic acidosis   Acute encephalopathy Improved. Likely secondary to polypharmacy. CT head unremarkable. MRI motion degraded and without acute process.  Acute kidney injury Improved. Initially received fluids.  Non-anion gap metabolic acidosis Stable.  -repeat BMP  Tachypnea Improved from yesterday. No chest pain. Chest x-ray unremarkable for acute process.  Hyponatremia Resolved with IV fluids.  Leukocytosis Continues to worsened and in the setting of tachypnea, abdominal distension, respiratory alkalosis and mental status change. Afebrile. No nuchal rigidity. Urine culture significant for no growth. Per calcitonin is high -blood culture -CT chest and abdomen/pelvis  Lower extremity weakness -PT recommending SNF -Blood cultures pending  Mood disorder -Continue Valium to PRN -discontinue Seroquel  Hyperlipidemia -continue Lipitor  Essential hypertension Stable. -continue metoprolol  COPD Stable. No exacerbation -continue Spiriva -continue Xopenex -continue chronic prednisone  Genital warts Unknown how chronic this is. Asymptomatic. Unknown if patient still has cervix but she has had a hysterectomy. Unknown cervical cancer screening history. Would recommend outpatient follow-up for treatment and would likely benefit from cervical cancer screening if not up-to-date even though patient is greater than  51 years old.  Hypokalemia -Replace   DVT prophylaxis: Heparin Code Status: Full code Family Communication: None at bedside Disposition Plan: Discharge in several days   Consultants:   None  Procedures:   None  Antimicrobials:  None    Subjective: Afebrile. No chest pain or dyspnea.  Objective: Vitals:   05/13/17 2050 05/13/17 2114 05/13/17 2223 05/14/17 0426  BP: (!) 98/52 (!) 104/57  (!) 115/57  Pulse: (!) 114 (!) 108  98  Resp:    18  Temp:  97.7 F (36.5 C)  98.2 F (36.8 C)  TempSrc:  Oral  Oral  SpO2:   96% 97%  Weight:      Height:        Intake/Output Summary (Last 24 hours) at 05/14/17 1159 Last data filed at 05/14/17 0735  Gross per 24 hour  Intake              650 ml  Output              450 ml  Net              200 ml   Filed Weights   05/11/17 1352 05/11/17 2134  Weight: 72.6 kg (160 lb) 79.5 kg (175 lb 4.3 oz)    Examination:  General exam: Appears calm and comfortable Respiratory system: Decreased breath sounds on auscultation bilaterally. No wheeze today. Cardiovascular system: S1 & S2 heard, RRR. Gastrointestinal system: Abdomen is nondistended, soft and nontender. Normal bowel sounds heard. GU: multiple warts present along labia that run posterior towards perineum. Central nervous system: Alert, oriented to person only. Extremities: No edema. No calf tenderness Skin: No cyanosis. No rashes Psychiatry: Judgement and insight appear Impaired. Mood & affect depressed and flat.     Data Reviewed: I have personally reviewed following labs and imaging studies  CBC:  Recent Labs Lab 05/11/17 1426 05/12/17 0508 05/13/17 1036 05/14/17 1038  WBC 16.5* 14.4* 24.1* 25.2*  NEUTROABS 14.0*  --   --   --   HGB 10.5* 10.0* 10.9* 9.9*  HCT 31.9* 30.1* 33.3* 29.0*  MCV 97.3 97.7 95.7 95.1  PLT 461* 428* 582* 534*   Basic Metabolic Panel:  Recent Labs Lab 05/11/17 1426 05/12/17 0508 05/13/17 1036 05/14/17 1038  NA 130* 137  137 135  K 4.1 3.7 3.9 3.2*  CL 105 113* 110 109  CO2 16* 14* 19* 18*  GLUCOSE 130* 85 170* 138*  BUN 38* 26* 17 15  CREATININE 2.14* 1.07* 1.01* 0.96  CALCIUM 8.2* 8.1* 8.7* 8.0*   GFR: Estimated Creatinine Clearance: 57.6 mL/min (by C-G formula based on SCr of 0.96 mg/dL). Liver Function Tests:  Recent Labs Lab 05/11/17 1426  AST 40  ALT 34  ALKPHOS 222*  BILITOT 0.3  PROT 6.0*  ALBUMIN 2.8*    Recent Labs Lab 05/11/17 1426  LIPASE 21   No results for input(s): AMMONIA in the last 168 hours. Coagulation Profile:  Recent Labs Lab 05/11/17 1426  INR 1.09   Cardiac Enzymes: No results for input(s): CKTOTAL, CKMB, CKMBINDEX, TROPONINI in the last 168 hours. BNP (last 3 results) No results for input(s): PROBNP in the last 8760 hours. HbA1C: No results for input(s): HGBA1C in the last 72 hours. CBG: No results for input(s): GLUCAP in the last 168 hours. Lipid Profile: No results for input(s): CHOL, HDL, LDLCALC, TRIG, CHOLHDL, LDLDIRECT in the last 72 hours. Thyroid Function Tests: No results for input(s): TSH, T4TOTAL, FREET4, T3FREE, THYROIDAB in the last 72 hours. Anemia Panel: No results for input(s): VITAMINB12, FOLATE, FERRITIN, TIBC, IRON, RETICCTPCT in the last 72 hours. Sepsis Labs:  Recent Labs Lab 05/11/17 1614 05/13/17 1346  PROCALCITON  --  17.52  LATICACIDVEN 1.09  --     Recent Results (from the past 240 hour(s))  Urine culture     Status: None   Collection Time: 05/11/17  2:59 PM  Result Value Ref Range Status   Specimen Description URINE, CLEAN CATCH  Final   Special Requests NONE  Final   Culture   Final    NO GROWTH Performed at [facility], [address], [place], [place] [address]    Report Status 05/12/2017 FINAL  Final  MRSA PCR Screening     Status: None   Collection Time: 05/12/17  5:33 AM  Result Value Ref Range Status   MRSA by PCR NEGATIVE NEGATIVE Final    Comment:        The GeneXpert MRSA Assay  (FDA approved for NASAL specimens only), is one component of a comprehensive MRSA colonization surveillance program. It is not intended to diagnose MRSA infection nor to guide or monitor treatment for MRSA infections.   Culture, blood (routine x 2)     Status: None (Preliminary result)   Collection Time: 05/13/17  1:46 PM  Result Value Ref Range Status   Specimen Description BLOOD RIGHT ANTECUBITAL  Final   Special Requests IN PEDIATRIC BOTTLE Blood Culture adequate volume  Final   Culture   Final    NO GROWTH < 24 HOURS Performed at [facility], [address], [place], [place] [address]    Report Status PENDING  Incomplete  Culture, blood (routine x 2)     Status: None (Preliminary result)   Collection Time: 05/13/17  1:53 PM  Result Value Ref Range Status  Specimen Description BLOOD RIGHT ANTECUBITAL  Final   Special Requests   Final    BOTTLES DRAWN AEROBIC AND ANAEROBIC Blood Culture adequate volume   Culture   Final    NO GROWTH < 24 HOURS Performed at [facility], [address] [place], [place] [address]    Report Status PENDING  Incomplete         Radiology Studies: Dg Chest Port 1 View  Result Date: 05/13/2017 CLINICAL DATA:  Rapid breathing. EXAM: PORTABLE CHEST 1 VIEW COMPARISON:  05/11/2017.  04/05/2015. FINDINGS: Mediastinum and hilar structures normal. Cardiomegaly with normal pulmonary vascularity.Mild left base subsegmental atelectasis and/or scarring. IMPRESSION: Mild left base subsegmental atelectasis and/or scarring. Chest is stable from prior exam . Electronically Signed   By: Maisie Fus  Register   On: 05/13/2017 12:15        Scheduled Meds: . atorvastatin  10 mg Oral QHS  . heparin  5,000 Units Subcutaneous Q8H  . iron polysaccharides  150 mg Oral Q breakfast  . metoprolol tartrate  25 mg Oral BID  . mometasone-formoterol  2 puff Inhalation BID  . polyethylene glycol  17 g Oral Daily  . potassium chloride  40 mEq Oral Once  .  predniSONE  2.5 mg Oral Q breakfast  . tiotropium  18 mcg Inhalation Daily  . venlafaxine  75 mg Oral BID   Continuous Infusions: . sodium chloride       LOS: 3 days     Jacquelin Hawking, MD [facility] 05/14/2017, 11:59 AM Pager: [phone]  If 7PM-7AM, please contact night-coverage [email or web address] Password [email or web address] 05/14/2017, 11:59 AM

## 2017-05-14 NOTE — Progress Notes (Signed)
To CT

## 2017-05-14 NOTE — Progress Notes (Signed)
Back from CT c/o pain PRN given

## 2017-05-15 ENCOUNTER — Inpatient Hospital Stay (HOSPITAL_COMMUNITY): Payer: Medicare Other

## 2017-05-15 DIAGNOSIS — R748 Abnormal levels of other serum enzymes: Secondary | ICD-10-CM

## 2017-05-15 DIAGNOSIS — N179 Acute kidney failure, unspecified: Secondary | ICD-10-CM

## 2017-05-15 LAB — CBC
HCT: 28.4 % — ABNORMAL LOW (ref 36.0–46.0)
HEMOGLOBIN: 9.5 g/dL — AB (ref 12.0–15.0)
MCH: 32.1 pg (ref 26.0–34.0)
MCHC: 33.5 g/dL (ref 30.0–36.0)
MCV: 95.9 fL (ref 78.0–100.0)
PLATELETS: 568 10*3/uL — AB (ref 150–400)
RBC: 2.96 MIL/uL — AB (ref 3.87–5.11)
RDW: 14.6 % (ref 11.5–15.5)
WBC: 23.6 10*3/uL — ABNORMAL HIGH (ref 4.0–10.5)

## 2017-05-15 LAB — URINALYSIS, COMPLETE (UACMP) WITH MICROSCOPIC
BILIRUBIN URINE: NEGATIVE
GLUCOSE, UA: NEGATIVE mg/dL
Ketones, ur: NEGATIVE mg/dL
Nitrite: NEGATIVE
PROTEIN: NEGATIVE mg/dL
Specific Gravity, Urine: 1.01 (ref 1.005–1.030)
pH: 6 (ref 5.0–8.0)

## 2017-05-15 LAB — HEPATIC FUNCTION PANEL
ALK PHOS: 137 U/L — AB (ref 38–126)
ALT: 25 U/L (ref 14–54)
AST: 24 U/L (ref 15–41)
Albumin: 2.6 g/dL — ABNORMAL LOW (ref 3.5–5.0)
Bilirubin, Direct: 0.1 mg/dL (ref 0.1–0.5)
Indirect Bilirubin: 0.3 mg/dL (ref 0.3–0.9)
TOTAL PROTEIN: 5.9 g/dL — AB (ref 6.5–8.1)
Total Bilirubin: 0.4 mg/dL (ref 0.3–1.2)

## 2017-05-15 LAB — BASIC METABOLIC PANEL
Anion gap: 7 (ref 5–15)
BUN: 12 mg/dL (ref 6–20)
CHLORIDE: 108 mmol/L (ref 101–111)
CO2: 20 mmol/L — ABNORMAL LOW (ref 22–32)
Calcium: 8 mg/dL — ABNORMAL LOW (ref 8.9–10.3)
Creatinine, Ser: 0.68 mg/dL (ref 0.44–1.00)
GFR calc Af Amer: >60 mL/min (ref >60)
Glucose, Bld: 92 mg/dL (ref 65–99)
POTASSIUM: 3.7 mmol/L (ref 3.5–5.1)
SODIUM: 135 mmol/L (ref 135–145)

## 2017-05-15 LAB — PROCALCITONIN: PROCALCITONIN: 3.53 ng/mL

## 2017-05-15 MED ORDER — LIP MEDEX EX OINT
TOPICAL_OINTMENT | CUTANEOUS | Status: DC | PRN
  Filled 2017-05-15 (×2): qty 7

## 2017-05-15 MED ORDER — CEFTRIAXONE SODIUM 1 G IJ SOLR
1.0000 g | INTRAMUSCULAR | Status: DC
  Administered 2017-05-15 – 2017-05-17 (×3): 1 g via INTRAVENOUS
  Filled 2017-05-15 (×3): qty 10

## 2017-05-15 NOTE — Progress Notes (Signed)
PROGRESS NOTE    Gracyn Allor  XBJ:[id number] DOB: 05/01/1946 DOA: 05/11/2017 PCP: Patient, No Pcp Per   Brief Narrative: Maxcine Strong is a 71 y.o. chemotherapy history of intracranial hemorrhage, hyperlipidemia, hypertension, COPD. She presents with lower extremity weakness and was found to have acute encephalopathy in addition to metabolic acidosis and AKI   Assessment & Plan:   Active Problems:   Hyponatremia   Acute encephalopathy   AKI (acute kidney injury) (HCC)   Leukocytosis   Metabolic acidosis   Lower extremity weakness   Hyperchloremic metabolic acidosis   Acute encephalopathy Improved. Likely secondary to polypharmacy. CT head unremarkable. MRI motion degraded and without acute process.  Acute kidney injury Resolved.  Non-anion gap metabolic acidosis Improved -repeat BMP  Tachypnea Resolved. CT chest unremarkable for etiology.  Hyponatremia Resolved with IV fluids.  Leukocytosis Improved slightly. procalcitonin trended down. Patient was prescribed steroids prior to arrival but possible she may not have been taking them. She has been afebrile this entire stay. No abscesses on CT imaging. -blood culture no growth to date  Lower extremity weakness -PT recommending SNF -Blood cultures pending  Mood disorder -Continue Valium to PRN -discontinued Seroquel  Hyperlipidemia -continue Lipitor  Essential hypertension Stable. -continue metoprolol  COPD Stable. No exacerbation -continue Spiriva -continue Xopenex -continue chronic prednisone  Genital warts Unknown how chronic this is. Asymptomatic. Unknown if patient still has cervix but she has had a hysterectomy. Unknown cervical cancer screening history. Would recommend outpatient follow-up for treatment and would likely benefit from cervical cancer screening if not up-to-date even though patient is greater than 69 years old.  Hypokalemia -Replace  Elevated Alkaline  phosphatase Gallbladder wall thickening Possible gallbladder pathology. Asymptomatic. No gallstones seen on imaging.  -RUQ ultrasound  Concern for decreased renal perfusion Seen on CT abdomen/pelvis -RA duplex  Multiple thoracic compression fractures -symptomatic management   DVT prophylaxis: Heparin Code Status: Full code Family Communication: None at bedside Disposition Plan: Discharge in several days   Consultants:   None  Procedures:   None  Antimicrobials:  None    Subjective: Afebrile. No abdominal pain.  Objective: Vitals:   05/14/17 0426 05/14/17 1443 05/14/17 2056 05/15/17 0325  BP: (!) 115/57 116/60 119/65 121/82  Pulse: 98 92 94 78  Resp: 18 18 18 16   Temp: 98.2 F (36.8 C) 98.3 F (36.8 C) 98.3 F (36.8 C) 98 F (36.7 C)  TempSrc: Oral Oral Oral Oral  SpO2: 97% 97% 98% 95%  Weight:      Height:        Intake/Output Summary (Last 24 hours) at 05/15/17 1320 Last data filed at 05/15/17 0416  Gross per 24 hour  Intake              240 ml  Output                0 ml  Net              240 ml   Filed Weights   05/11/17 1352 05/11/17 2134  Weight: 72.6 kg (160 lb) 79.5 kg (175 lb 4.3 oz)    Examination:  General exam: Appears calm and comfortable Respiratory system: Clear to auscultation bilaterally. Unlabored work of breathing. No wheezing or rales. Cardiovascular system: S1 & S2 heard, RRR. Gastrointestinal system: Soft, non-tender, non-distended, no guarding, no rebound, no masses felt GU: multiple warts present along labia that run posterior towards perineum. Central nervous system: Alert, oriented to person only. Extremities: No edema. No calf  tenderness Skin: No cyanosis. No rashes Psychiatry: Judgement and insight appear Impaired. Mood & affect depressed and flat.     Data Reviewed: I have personally reviewed following labs and imaging studies  CBC:  Recent Labs Lab 05/11/17 1426 05/12/17 0508 05/13/17 1036  05/14/17 1038 05/15/17 0517  WBC 16.5* 14.4* 24.1* 25.2* 23.6*  NEUTROABS 14.0*  --   --   --   --   HGB 10.5* 10.0* 10.9* 9.9* 9.5*  HCT 31.9* 30.1* 33.3* 29.0* 28.4*  MCV 97.3 97.7 95.7 95.1 95.9  PLT 461* 428* 582* 534* 568*   Basic Metabolic Panel:  Recent Labs Lab 05/11/17 1426 05/12/17 0508 05/13/17 1036 05/14/17 1038 05/15/17 0517  NA 130* 137 137 135 135  K 4.1 3.7 3.9 3.2* 3.7  CL 105 113* 110 109 108  CO2 16* 14* 19* 18* 20*  GLUCOSE 130* 85 170* 138* 92  BUN 38* 26* 17 15 12   CREATININE 2.14* 1.07* 1.01* 0.96 0.68  CALCIUM 8.2* 8.1* 8.7* 8.0* 8.0*   GFR: Estimated Creatinine Clearance: 69.2 mL/min (by C-G formula based on SCr of 0.68 mg/dL). Liver Function Tests:  Recent Labs Lab 05/11/17 1426 05/15/17 0510  AST 40 24  ALT 34 25  ALKPHOS 222* 137*  BILITOT 0.3 0.4  PROT 6.0* 5.9*  ALBUMIN 2.8* 2.6*    Recent Labs Lab 05/11/17 1426  LIPASE 21   No results for input(s): AMMONIA in the last 168 hours. Coagulation Profile:  Recent Labs Lab 05/11/17 1426  INR 1.09   Cardiac Enzymes: No results for input(s): CKTOTAL, CKMB, CKMBINDEX, TROPONINI in the last 168 hours. BNP (last 3 results) No results for input(s): PROBNP in the last 8760 hours. HbA1C: No results for input(s): HGBA1C in the last 72 hours. CBG: No results for input(s): GLUCAP in the last 168 hours. Lipid Profile: No results for input(s): CHOL, HDL, LDLCALC, TRIG, CHOLHDL, LDLDIRECT in the last 72 hours. Thyroid Function Tests: No results for input(s): TSH, T4TOTAL, FREET4, T3FREE, THYROIDAB in the last 72 hours. Anemia Panel: No results for input(s): VITAMINB12, FOLATE, FERRITIN, TIBC, IRON, RETICCTPCT in the last 72 hours. Sepsis Labs:  Recent Labs Lab 05/11/17 1614 05/13/17 1346 05/15/17 0517  PROCALCITON  --  17.52 3.53  LATICACIDVEN 1.09  --   --     Recent Results (from the past 240 hour(s))  Urine culture     Status: None   Collection Time: 05/11/17  2:59 PM   Result Value Ref Range Status   Specimen Description URINE, CLEAN CATCH  Final   Special Requests NONE  Final   Culture   Final    NO GROWTH Performed at [facility], [address] [place], [place] [address]    Report Status 05/12/2017 FINAL  Final  MRSA PCR Screening     Status: None   Collection Time: 05/12/17  5:33 AM  Result Value Ref Range Status   MRSA by PCR NEGATIVE NEGATIVE Final    Comment:        The GeneXpert MRSA Assay (FDA approved for NASAL specimens only), is one component of a comprehensive MRSA colonization surveillance program. It is not intended to diagnose MRSA infection nor to guide or monitor treatment for MRSA infections.   Culture, blood (routine x 2)     Status: None (Preliminary result)   Collection Time: 05/13/17  1:46 PM  Result Value Ref Range Status   Specimen Description BLOOD RIGHT ANTECUBITAL  Final   Special Requests IN PEDIATRIC  BOTTLE Blood Culture adequate volume  Final   Culture   Final    NO GROWTH < 24 HOURS Performed at [facility], [address] [place], [place] [address]    Report Status PENDING  Incomplete  Culture, blood (routine x 2)     Status: None (Preliminary result)   Collection Time: 05/13/17  1:53 PM  Result Value Ref Range Status   Specimen Description BLOOD RIGHT ANTECUBITAL  Final   Special Requests   Final    BOTTLES DRAWN AEROBIC AND ANAEROBIC Blood Culture adequate volume   Culture   Final    NO GROWTH < 24 HOURS Performed at [facility], [address], [place], [place] [address]    Report Status PENDING  Incomplete         Radiology Studies: Ct Chest W Contrast  Result Date: 05/14/2017 CLINICAL DATA:  Acute encephalopathy with metabolic acidosis and leukocytosis. Abdominal distention. EXAM: CT CHEST, ABDOMEN, AND PELVIS WITH CONTRAST TECHNIQUE: Multidetector CT imaging of the chest, abdomen and pelvis was performed following the standard protocol during bolus administration of  intravenous contrast. CONTRAST:  ISOVUE-300 IOPAMIDOL (ISOVUE-300) INJECTION 61% COMPARISON:  None. FINDINGS: CT CHEST FINDINGS Cardiovascular: Heart size upper normal. Coronary artery calcification is evident. Atherosclerotic calcification is noted in the wall of the thoracic aorta. Mediastinum/Nodes: No mediastinal lymphadenopathy. There is no hilar lymphadenopathy. There is no axillary lymphadenopathy. The esophagus has normal imaging features. Lungs/Pleura: Fine detail obscured by respiratory motion. Scattered areas of subsegmental atelectasis. No pulmonary edema. No focal airspace consolidation. Calcified granuloma identified posterior left lower lobe. Musculoskeletal: Multiple nonacute right rib fractures evident. Bones are diffusely demineralized. Compression fractures are noted at T4, T5, T6, and T9. CT ABDOMEN PELVIS FINDINGS Hepatobiliary: No focal abnormality within the liver parenchyma. Gallbladder is distended question gallbladder wall thickening and/or mild pericholecystic fluid. No gallstones are evident. No intrahepatic or extrahepatic biliary dilation. Pancreas: Pancreas diffusely atrophic. Spleen: No splenomegaly. No focal mass lesion. Adrenals/Urinary Tract: No adrenal nodule or mass. Right kidney unremarkable. Differentially decreased perfusion identified diffusely in the left kidney without hydronephrosis. No evidence for hydroureter. The urinary bladder appears normal for the degree of distention. Stomach/Bowel: Small hiatal hernia. Stomach otherwise unremarkable. Duodenum is normally positioned as is the ligament of Treitz. No small bowel wall thickening. No small bowel dilatation. The terminal ileum is normal. The appendix is not visualized, but there is no edema or inflammation in the region of the cecum. Left colon is decompressed with a segment of distal descending colon and proximal sigmoid colon showing subtle pericolonic edema/ inflammation. No substantial diverticular disease in  this area. No abscess. Vascular/Lymphatic: There is abdominal aortic atherosclerosis without aneurysm. Portal vein and superior mesenteric vein are patent. The celiac axis, SMA and IMA are opacified. Reproductive: Uterus surgically absent.  There is no adnexal mass. Other: No intraperitoneal free fluid. Musculoskeletal: Marked pelvic floor laxity is evident. Bone windows reveal no worrisome lytic or sclerotic osseous lesions. IMPRESSION: 1. Edematous wall thickening with subtle pericolonic edema/inflammation associated with the distal descending and proximal sigmoid colon. Differential considerations would include infectious/inflammatory colitis. Short segment ischemic colitis cannot be excluded although major mesenteric arterial anatomy is patent proximally. No evidence for central mesenteric venous occlusion. 2. Distended gallbladder with apparent mild gallbladder wall thickening and/or pericholecystic fluid. No gallstones are evident. Acute cholecystitis in the setting of non mineralized stones or acalculous cholecystitis would be considerations. Abdominal ultrasound may prove helpful to further evaluate as clinically warranted. 3.  Differentially decreased perfusion of the left kidney without hydronephrosis to suggest obstructive uropathy. As such, renal artery stenosis would be a consideration. Left renal vein is patent. 4.  Aortic Atherosclerois (ICD10-170.0) Electronically Signed   By: Kennith Center M.D.   On: 05/14/2017 18:14   Ct Abdomen Pelvis W Contrast  Result Date: 05/14/2017 CLINICAL DATA:  Acute encephalopathy with metabolic acidosis and leukocytosis. Abdominal distention. EXAM: CT CHEST, ABDOMEN, AND PELVIS WITH CONTRAST TECHNIQUE: Multidetector CT imaging of the chest, abdomen and pelvis was performed following the standard protocol during bolus administration of intravenous contrast. CONTRAST:  ISOVUE-300 IOPAMIDOL (ISOVUE-300) INJECTION 61% COMPARISON:  None. FINDINGS: CT CHEST FINDINGS  Cardiovascular: Heart size upper normal. Coronary artery calcification is evident. Atherosclerotic calcification is noted in the wall of the thoracic aorta. Mediastinum/Nodes: No mediastinal lymphadenopathy. There is no hilar lymphadenopathy. There is no axillary lymphadenopathy. The esophagus has normal imaging features. Lungs/Pleura: Fine detail obscured by respiratory motion. Scattered areas of subsegmental atelectasis. No pulmonary edema. No focal airspace consolidation. Calcified granuloma identified posterior left lower lobe. Musculoskeletal: Multiple nonacute right rib fractures evident. Bones are diffusely demineralized. Compression fractures are noted at T4, T5, T6, and T9. CT ABDOMEN PELVIS FINDINGS Hepatobiliary: No focal abnormality within the liver parenchyma. Gallbladder is distended question gallbladder wall thickening and/or mild pericholecystic fluid. No gallstones are evident. No intrahepatic or extrahepatic biliary dilation. Pancreas: Pancreas diffusely atrophic. Spleen: No splenomegaly. No focal mass lesion. Adrenals/Urinary Tract: No adrenal nodule or mass. Right kidney unremarkable. Differentially decreased perfusion identified diffusely in the left kidney without hydronephrosis. No evidence for hydroureter. The urinary bladder appears normal for the degree of distention. Stomach/Bowel: Small hiatal hernia. Stomach otherwise unremarkable. Duodenum is normally positioned as is the ligament of Treitz. No small bowel wall thickening. No small bowel dilatation. The terminal ileum is normal. The appendix is not visualized, but there is no edema or inflammation in the region of the cecum. Left colon is decompressed with a segment of distal descending colon and proximal sigmoid colon showing subtle pericolonic edema/ inflammation. No substantial diverticular disease in this area. No abscess. Vascular/Lymphatic: There is abdominal aortic atherosclerosis without aneurysm. Portal vein and superior  mesenteric vein are patent. The celiac axis, SMA and IMA are opacified. Reproductive: Uterus surgically absent.  There is no adnexal mass. Other: No intraperitoneal free fluid. Musculoskeletal: Marked pelvic floor laxity is evident. Bone windows reveal no worrisome lytic or sclerotic osseous lesions. IMPRESSION: 1. Edematous wall thickening with subtle pericolonic edema/inflammation associated with the distal descending and proximal sigmoid colon. Differential considerations would include infectious/inflammatory colitis. Short segment ischemic colitis cannot be excluded although major mesenteric arterial anatomy is patent proximally. No evidence for central mesenteric venous occlusion. 2. Distended gallbladder with apparent mild gallbladder wall thickening and/or pericholecystic fluid. No gallstones are evident. Acute cholecystitis in the setting of non mineralized stones or acalculous cholecystitis would be considerations. Abdominal ultrasound may prove helpful to further evaluate as clinically warranted. 3. Differentially decreased perfusion of the left kidney without hydronephrosis to suggest obstructive uropathy. As such, renal artery stenosis would be a consideration. Left renal vein is patent. 4.  Aortic Atherosclerois (ICD10-170.0) Electronically Signed   By: Kennith Center M.D.   On: 05/14/2017 18:14        Scheduled Meds: . atorvastatin  10 mg Oral QHS  . heparin  5,000 Units Subcutaneous Q8H  . iron polysaccharides  150 mg Oral Q breakfast  . metoprolol tartrate  25 mg Oral BID  . mometasone-formoterol  2  puff Inhalation BID  . predniSONE  2.5 mg Oral Q breakfast  . tiotropium  18 mcg Inhalation Daily  . venlafaxine  75 mg Oral BID   Continuous Infusions: . sodium chloride       LOS: 4 days     Jacquelin Hawking, MD [facility] 05/15/2017, 1:20 PM Pager: [phone]  If 7PM-7AM, please contact night-coverage [email or web address] Password [email or web address] 05/15/2017, 1:20 PM

## 2017-05-15 NOTE — Progress Notes (Signed)
PT Cancellation Note  Patient Details Name: Lilamae Sobh MRN: [id number] DOB: 1946/05/19   Cancelled Treatment:    Reason Eval/Treat Not Completed: Fatigue/lethargy limiting ability to participate (pt refused PT, stated she, "I don't want to, I'm too tired". Benefits of mobility explained to pt, she continued to refuse. Will follow.  )   Tamala Ser 05/15/2017, 2:47 PM  [phone]

## 2017-05-15 NOTE — Progress Notes (Signed)
I&O cath for UA per order

## 2017-05-15 NOTE — Care Management Important Message (Signed)
Important Message  Patient Details  Name: Tyffanie Leisenring MRN: [id number] Date of Birth: [birth year]   Medicare Important Message Given:  Yes    Caren Macadam 05/15/2017, 11:33 AMImportant Message  Patient Details  Name: Matika Naidoo MRN: [id number] Date of Birth: [birth year]   Medicare Important Message Given:  Yes    Caren Macadam 05/15/2017, 11:33 AM

## 2017-05-15 NOTE — Progress Notes (Signed)
Spoke to [place] she states very busy today will do abd [place] in AM keep NPO p MN.

## 2017-05-15 NOTE — Progress Notes (Signed)
Preliminary results by tech - Renal Duplex Completed. Suboptimal study due to patient's body habitus. Bilateral renal arteries are patent without significant stenosis in the segments that were imaged. Origin of the renal arteries were not clearly seen, cannot exclude stenosis. Marilynne Halsted, BS, RDMS, RVT

## 2017-05-15 NOTE — Progress Notes (Signed)
Patient had 12 beats of V-tach. Asymptomatic. Vitals: 98 F (oral);HR 78;RR18;121/82;95 % room air. PCP was notified

## 2017-05-16 ENCOUNTER — Inpatient Hospital Stay (HOSPITAL_COMMUNITY): Payer: Medicare Other

## 2017-05-16 DIAGNOSIS — K8020 Calculus of gallbladder without cholecystitis without obstruction: Secondary | ICD-10-CM

## 2017-05-16 LAB — GLUCOSE, CAPILLARY: Glucose-Capillary: 107 mg/dL — ABNORMAL HIGH (ref 65–99)

## 2017-05-16 LAB — CBC
HEMATOCRIT: 28 % — AB (ref 36.0–46.0)
Hemoglobin: 9.4 g/dL — ABNORMAL LOW (ref 12.0–15.0)
MCH: 32.2 pg (ref 26.0–34.0)
MCHC: 33.6 g/dL (ref 30.0–36.0)
MCV: 95.9 fL (ref 78.0–100.0)
Platelets: 556 10*3/uL — ABNORMAL HIGH (ref 150–400)
RBC: 2.92 MIL/uL — AB (ref 3.87–5.11)
RDW: 14.4 % (ref 11.5–15.5)
WBC: 21.6 10*3/uL — AB (ref 4.0–10.5)

## 2017-05-16 NOTE — Progress Notes (Signed)
Patient has been progressively restless and agitated most of day trying to get out of bed without assistance with no regard to instructions by techs or nurses to call and wait for help. MD notified and order for tele sitter received and patient moved to room close to nurse's station for closer monitoring. Melton Alar, RN

## 2017-05-16 NOTE — Progress Notes (Signed)
PROGRESS NOTE    Danielle [name]  ZOX:[id number] DOB: 08/04/1946 DOA: 05/11/2017 PCP: Patient, No Pcp Per   Brief Narrative: Danielle [name] is a 71 y.o. chemotherapy history of intracranial hemorrhage, hyperlipidemia, hypertension, COPD. She presents with lower extremity weakness and was found to have acute encephalopathy in addition to metabolic acidosis and AKI   Assessment & Plan:   Active Problems:   Hyponatremia   Acute encephalopathy   AKI (acute kidney injury) (HCC)   Leukocytosis   Metabolic acidosis   Lower extremity weakness   Hyperchloremic metabolic acidosis   Acute encephalopathy Initially improved, now slightly worsened. Possible infectious component. CT head unremarkable. MRI motion degraded and without acute process. -urine culture pending -continue ceftriaxone  Acute kidney injury Resolved.  Non-anion gap metabolic acidosis Improved -repeat BMP  Tachypnea Resolved. CT chest unremarkable for etiology.  Hyponatremia Resolved with IV fluids.  Leukocytosis Improved slightly. procalcitonin trended down. Patient was prescribed steroids prior to arrival but possible she may not have been taking them. She has been afebrile this entire stay. No abscesses on CT imaging. -blood culture no growth to date  Lower extremity weakness -PT recommending SNF -Blood cultures pending  Mood disorder -Continue Valium to PRN -discontinued Seroquel  Hyperlipidemia -continue Lipitor  Essential hypertension Stable. -continue metoprolol  COPD Stable. No exacerbation -continue Spiriva -continue Xopenex -continue chronic prednisone  Genital warts Unknown how chronic this is. Asymptomatic. Unknown if patient still has cervix but she has had a hysterectomy. Unknown cervical cancer screening history. Would recommend outpatient follow-up for treatment and would likely benefit from cervical cancer screening if not up-to-date even though patient is greater than 38  years old.  Hypokalemia -Replace  Elevated Alkaline phosphatase Gallbladder wall thickening Possible gallbladder pathology. Asymptomatic. No gallstones seen on imaging.  -RUQ ultrasound pending  Concern for decreased renal perfusion Seen on CT abdomen/pelvis. RA duplex without evidence of stenosis  Multiple thoracic compression fractures -symptomatic management.  Enteritis Possible source of infection. Afebrile. Procalcitonin trended down. -symptomatic management   DVT prophylaxis: Heparin subq Code Status: Full code Family Communication: Son in Social worker Disposition Plan: Discharge in several days   Consultants:   None  Procedures:   None  Antimicrobials:  Ceftriaxone   Subjective: Afebrile. No abdominal pain.  Objective: Vitals:   05/15/17 0325 05/15/17 1513 05/15/17 1912 05/16/17 0610  BP: 121/82 121/62  (!) 121/58  Pulse: 78 86  96  Resp: 16 16  18   Temp: 98 F (36.7 C) 97.9 F (36.6 C)  98 F (36.7 C)  TempSrc: Oral Oral  Oral  SpO2: 95% 96% 95% 98%  Weight:      Height:        Intake/Output Summary (Last 24 hours) at 05/16/17 1142 Last data filed at 05/16/17 0900  Gross per 24 hour  Intake              530 ml  Output              650 ml  Net             -120 ml   Filed Weights   05/11/17 1352 05/11/17 2134  Weight: 72.6 kg (160 lb) 79.5 kg (175 lb 4.3 oz)    Examination:  General exam: Appears calm and comfortable Respiratory system: Clear to auscultation bilaterally. Unlabored work of breathing. No wheezing or rales. Cardiovascular system: Regular rate and rhythm. Normal S1 and S2. No heart murmurs present. No extra heart sounds Gastrointestinal system: Soft, non-tender, non-distended, no  guarding, no rebound, no masses felt. Bowel sounds heard GU: multiple warts present along labia that run posterior towards perineum (seen on 05/15/17) Central nervous system: Alert, oriented to person. Extremities: No edema. No calf tenderness Skin: No  cyanosis. Multiple areas of ecchymoses. Psychiatry: Judgement and insight appear Impaired. Mood & affect depressed and flat.     Data Reviewed: I have personally reviewed following labs and imaging studies  CBC:  Recent Labs Lab 05/11/17 1426 05/12/17 0508 05/13/17 1036 05/14/17 1038 05/15/17 0517 05/16/17 0432  WBC 16.5* 14.4* 24.1* 25.2* 23.6* 21.6*  NEUTROABS 14.0*  --   --   --   --   --   HGB 10.5* 10.0* 10.9* 9.9* 9.5* 9.4*  HCT 31.9* 30.1* 33.3* 29.0* 28.4* 28.0*  MCV 97.3 97.7 95.7 95.1 95.9 95.9  PLT 461* 428* 582* 534* 568* 556*   Basic Metabolic Panel:  Recent Labs Lab 05/11/17 1426 05/12/17 0508 05/13/17 1036 05/14/17 1038 05/15/17 0517  NA 130* 137 137 135 135  K 4.1 3.7 3.9 3.2* 3.7  CL 105 113* 110 109 108  CO2 16* 14* 19* 18* 20*  GLUCOSE 130* 85 170* 138* 92  BUN 38* 26* 17 15 12   CREATININE 2.14* 1.07* 1.01* 0.96 0.68  CALCIUM 8.2* 8.1* 8.7* 8.0* 8.0*   GFR: Estimated Creatinine Clearance: 69.2 mL/min (by C-G formula based on SCr of 0.68 mg/dL). Liver Function Tests:  Recent Labs Lab 05/11/17 1426 05/15/17 0510  AST 40 24  ALT 34 25  ALKPHOS 222* 137*  BILITOT 0.3 0.4  PROT 6.0* 5.9*  ALBUMIN 2.8* 2.6*    Recent Labs Lab 05/11/17 1426  LIPASE 21   No results for input(s): AMMONIA in the last 168 hours. Coagulation Profile:  Recent Labs Lab 05/11/17 1426  INR 1.09   Cardiac Enzymes: No results for input(s): CKTOTAL, CKMB, CKMBINDEX, TROPONINI in the last 168 hours. BNP (last 3 results) No results for input(s): PROBNP in the last 8760 hours. HbA1C: No results for input(s): HGBA1C in the last 72 hours. CBG: No results for input(s): GLUCAP in the last 168 hours. Lipid Profile: No results for input(s): CHOL, HDL, LDLCALC, TRIG, CHOLHDL, LDLDIRECT in the last 72 hours. Thyroid Function Tests: No results for input(s): TSH, T4TOTAL, FREET4, T3FREE, THYROIDAB in the last 72 hours. Anemia Panel: No results for input(s):  VITAMINB12, FOLATE, FERRITIN, TIBC, IRON, RETICCTPCT in the last 72 hours. Sepsis Labs:  Recent Labs Lab 05/11/17 1614 05/13/17 1346 05/15/17 0517  PROCALCITON  --  17.52 3.53  LATICACIDVEN 1.09  --   --     Recent Results (from the past 240 hour(s))  Urine culture     Status: None   Collection Time: 05/11/17  2:59 PM  Result Value Ref Range Status   Specimen Description URINE, CLEAN CATCH  Final   Special Requests NONE  Final   Culture   Final    NO GROWTH Performed at [facility], [address] [place], [place] [address]    Report Status 05/12/2017 FINAL  Final  MRSA PCR Screening     Status: None   Collection Time: 05/12/17  5:33 AM  Result Value Ref Range Status   MRSA by PCR NEGATIVE NEGATIVE Final    Comment:        The GeneXpert MRSA Assay (FDA approved for NASAL specimens only), is one component of a comprehensive MRSA colonization surveillance program. It is not intended to diagnose MRSA infection nor to guide or monitor treatment  for MRSA infections.   Culture, blood (routine x 2)     Status: None (Preliminary result)   Collection Time: 05/13/17  1:46 PM  Result Value Ref Range Status   Specimen Description BLOOD RIGHT ANTECUBITAL  Final   Special Requests IN PEDIATRIC BOTTLE Blood Culture adequate volume  Final   Culture NO GROWTH 3 DAYS  Final   Report Status PENDING  Incomplete  Culture, blood (routine x 2)     Status: None (Preliminary result)   Collection Time: 05/13/17  1:53 PM  Result Value Ref Range Status   Specimen Description BLOOD RIGHT ANTECUBITAL  Final   Special Requests   Final    BOTTLES DRAWN AEROBIC AND ANAEROBIC Blood Culture adequate volume   Culture   Final    NO GROWTH 3 DAYS Performed at [facility], [address], [place], [place] [address]    Report Status PENDING  Incomplete         Radiology Studies: Ct Chest W Contrast  Result Date: 05/14/2017 CLINICAL DATA:  Acute encephalopathy with metabolic  acidosis and leukocytosis. Abdominal distention. EXAM: CT CHEST, ABDOMEN, AND PELVIS WITH CONTRAST TECHNIQUE: Multidetector CT imaging of the chest, abdomen and pelvis was performed following the standard protocol during bolus administration of intravenous contrast. CONTRAST:  ISOVUE-300 IOPAMIDOL (ISOVUE-300) INJECTION 61% COMPARISON:  None. FINDINGS: CT CHEST FINDINGS Cardiovascular: Heart size upper normal. Coronary artery calcification is evident. Atherosclerotic calcification is noted in the wall of the thoracic aorta. Mediastinum/Nodes: No mediastinal lymphadenopathy. There is no hilar lymphadenopathy. There is no axillary lymphadenopathy. The esophagus has normal imaging features. Lungs/Pleura: Fine detail obscured by respiratory motion. Scattered areas of subsegmental atelectasis. No pulmonary edema. No focal airspace consolidation. Calcified granuloma identified posterior left lower lobe. Musculoskeletal: Multiple nonacute right rib fractures evident. Bones are diffusely demineralized. Compression fractures are noted at T4, T5, T6, and T9. CT ABDOMEN PELVIS FINDINGS Hepatobiliary: No focal abnormality within the liver parenchyma. Gallbladder is distended question gallbladder wall thickening and/or mild pericholecystic fluid. No gallstones are evident. No intrahepatic or extrahepatic biliary dilation. Pancreas: Pancreas diffusely atrophic. Spleen: No splenomegaly. No focal mass lesion. Adrenals/Urinary Tract: No adrenal nodule or mass. Right kidney unremarkable. Differentially decreased perfusion identified diffusely in the left kidney without hydronephrosis. No evidence for hydroureter. The urinary bladder appears normal for the degree of distention. Stomach/Bowel: Small hiatal hernia. Stomach otherwise unremarkable. Duodenum is normally positioned as is the ligament of Treitz. No small bowel wall thickening. No small bowel dilatation. The terminal ileum is normal. The appendix is not visualized, but  there is no edema or inflammation in the region of the cecum. Left colon is decompressed with a segment of distal descending colon and proximal sigmoid colon showing subtle pericolonic edema/ inflammation. No substantial diverticular disease in this area. No abscess. Vascular/Lymphatic: There is abdominal aortic atherosclerosis without aneurysm. Portal vein and superior mesenteric vein are patent. The celiac axis, SMA and IMA are opacified. Reproductive: Uterus surgically absent.  There is no adnexal mass. Other: No intraperitoneal free fluid. Musculoskeletal: Marked pelvic floor laxity is evident. Bone windows reveal no worrisome lytic or sclerotic osseous lesions. IMPRESSION: 1. Edematous wall thickening with subtle pericolonic edema/inflammation associated with the distal descending and proximal sigmoid colon. Differential considerations would include infectious/inflammatory colitis. Short segment ischemic colitis cannot be excluded although major mesenteric arterial anatomy is patent proximally. No evidence for central mesenteric venous occlusion. 2. Distended gallbladder with apparent mild gallbladder wall thickening and/or pericholecystic fluid. No gallstones are evident.  Acute cholecystitis in the setting of non mineralized stones or acalculous cholecystitis would be considerations. Abdominal ultrasound may prove helpful to further evaluate as clinically warranted. 3. Differentially decreased perfusion of the left kidney without hydronephrosis to suggest obstructive uropathy. As such, renal artery stenosis would be a consideration. Left renal vein is patent. 4.  Aortic Atherosclerois (ICD10-170.0) Electronically Signed   By: Kennith Center M.D.   On: 05/14/2017 18:14   Ct Abdomen Pelvis W Contrast  Result Date: 05/14/2017 CLINICAL DATA:  Acute encephalopathy with metabolic acidosis and leukocytosis. Abdominal distention. EXAM: CT CHEST, ABDOMEN, AND PELVIS WITH CONTRAST TECHNIQUE: Multidetector CT imaging  of the chest, abdomen and pelvis was performed following the standard protocol during bolus administration of intravenous contrast. CONTRAST:  ISOVUE-300 IOPAMIDOL (ISOVUE-300) INJECTION 61% COMPARISON:  None. FINDINGS: CT CHEST FINDINGS Cardiovascular: Heart size upper normal. Coronary artery calcification is evident. Atherosclerotic calcification is noted in the wall of the thoracic aorta. Mediastinum/Nodes: No mediastinal lymphadenopathy. There is no hilar lymphadenopathy. There is no axillary lymphadenopathy. The esophagus has normal imaging features. Lungs/Pleura: Fine detail obscured by respiratory motion. Scattered areas of subsegmental atelectasis. No pulmonary edema. No focal airspace consolidation. Calcified granuloma identified posterior left lower lobe. Musculoskeletal: Multiple nonacute right rib fractures evident. Bones are diffusely demineralized. Compression fractures are noted at T4, T5, T6, and T9. CT ABDOMEN PELVIS FINDINGS Hepatobiliary: No focal abnormality within the liver parenchyma. Gallbladder is distended question gallbladder wall thickening and/or mild pericholecystic fluid. No gallstones are evident. No intrahepatic or extrahepatic biliary dilation. Pancreas: Pancreas diffusely atrophic. Spleen: No splenomegaly. No focal mass lesion. Adrenals/Urinary Tract: No adrenal nodule or mass. Right kidney unremarkable. Differentially decreased perfusion identified diffusely in the left kidney without hydronephrosis. No evidence for hydroureter. The urinary bladder appears normal for the degree of distention. Stomach/Bowel: Small hiatal hernia. Stomach otherwise unremarkable. Duodenum is normally positioned as is the ligament of Treitz. No small bowel wall thickening. No small bowel dilatation. The terminal ileum is normal. The appendix is not visualized, but there is no edema or inflammation in the region of the cecum. Left colon is decompressed with a segment of distal descending colon and  proximal sigmoid colon showing subtle pericolonic edema/ inflammation. No substantial diverticular disease in this area. No abscess. Vascular/Lymphatic: There is abdominal aortic atherosclerosis without aneurysm. Portal vein and superior mesenteric vein are patent. The celiac axis, SMA and IMA are opacified. Reproductive: Uterus surgically absent.  There is no adnexal mass. Other: No intraperitoneal free fluid. Musculoskeletal: Marked pelvic floor laxity is evident. Bone windows reveal no worrisome lytic or sclerotic osseous lesions. IMPRESSION: 1. Edematous wall thickening with subtle pericolonic edema/inflammation associated with the distal descending and proximal sigmoid colon. Differential considerations would include infectious/inflammatory colitis. Short segment ischemic colitis cannot be excluded although major mesenteric arterial anatomy is patent proximally. No evidence for central mesenteric venous occlusion. 2. Distended gallbladder with apparent mild gallbladder wall thickening and/or pericholecystic fluid. No gallstones are evident. Acute cholecystitis in the setting of non mineralized stones or acalculous cholecystitis would be considerations. Abdominal ultrasound may prove helpful to further evaluate as clinically warranted. 3. Differentially decreased perfusion of the left kidney without hydronephrosis to suggest obstructive uropathy. As such, renal artery stenosis would be a consideration. Left renal vein is patent. 4.  Aortic Atherosclerois (ICD10-170.0) Electronically Signed   By: Kennith Center M.D.   On: 05/14/2017 18:14   US Abdomen Limited Ruq  Result Date: 05/16/2017 CLINICAL DATA:  Increased LFTs EXAM: ULTRASOUND ABDOMEN LIMITED RIGHT UPPER  QUADRANT COMPARISON:  None. FINDINGS: Gallbladder: There is a 6 mm stone in the gallbladder. The gallbladder wall is thickened measuring 8.5 mm. No pericholecystic fluid or Murphy's sign. Common bile duct: Diameter: 5.4 mm Liver: No focal lesion  identified. Within normal limits in parenchymal echogenicity. Portal vein is patent on color Doppler imaging with normal direction of blood flow towards the liver. IMPRESSION: 1. There is a single 6 mm stone seen in the gallbladder. Wall thickening is identified as well measuring 8.5 mm. However, there is no pericholecystic fluid or Murphy's sign. If there is concern for acute cholecystitis, a HIDA scan could further evaluate. 2. No other abnormalities. Electronically Signed   By: Gerome Sam III M.D   On: 05/16/2017 10:11        Scheduled Meds: . atorvastatin  10 mg Oral QHS  . heparin  5,000 Units Subcutaneous Q8H  . iron polysaccharides  150 mg Oral Q breakfast  . metoprolol tartrate  25 mg Oral BID  . mometasone-formoterol  2 puff Inhalation BID  . predniSONE  2.5 mg Oral Q breakfast  . tiotropium  18 mcg Inhalation Daily  . venlafaxine  75 mg Oral BID   Continuous Infusions: . cefTRIAXone (ROCEPHIN)  IV Stopped (05/15/17 2045)  . sodium chloride       LOS: 5 days     Jacquelin Hawking, MD [facility] 05/16/2017, 11:42 AM Pager: [phone]  If 7PM-7AM, please contact night-coverage [email or web address] Password [email or web address] 05/16/2017, 11:42 AM

## 2017-05-17 LAB — BASIC METABOLIC PANEL
Anion gap: 8 (ref 5–15)
BUN: 6 mg/dL (ref 6–20)
CALCIUM: 8.1 mg/dL — AB (ref 8.9–10.3)
CO2: 27 mmol/L (ref 22–32)
CREATININE: 0.59 mg/dL (ref 0.44–1.00)
Chloride: 102 mmol/L (ref 101–111)
GFR calc Af Amer: >60 mL/min (ref >60)
GLUCOSE: 86 mg/dL (ref 65–99)
Potassium: 3.3 mmol/L — ABNORMAL LOW (ref 3.5–5.1)
Sodium: 137 mmol/L (ref 135–145)

## 2017-05-17 LAB — CBC
HCT: 29 % — ABNORMAL LOW (ref 36.0–46.0)
Hemoglobin: 9.5 g/dL — ABNORMAL LOW (ref 12.0–15.0)
MCH: 31.8 pg (ref 26.0–34.0)
MCHC: 32.8 g/dL (ref 30.0–36.0)
MCV: 97 fL (ref 78.0–100.0)
PLATELETS: 580 10*3/uL — AB (ref 150–400)
RBC: 2.99 MIL/uL — ABNORMAL LOW (ref 3.87–5.11)
RDW: 14.5 % (ref 11.5–15.5)
WBC: 17.2 10*3/uL — ABNORMAL HIGH (ref 4.0–10.5)

## 2017-05-17 LAB — GLUCOSE, CAPILLARY: GLUCOSE-CAPILLARY: 77 mg/dL (ref 65–99)

## 2017-05-17 LAB — PROCALCITONIN: PROCALCITONIN: 0.93 ng/mL

## 2017-05-17 MED ORDER — POTASSIUM CHLORIDE CRYS ER 20 MEQ PO TBCR
40.0000 meq | EXTENDED_RELEASE_TABLET | Freq: Once | ORAL | Status: AC
  Administered 2017-05-17: 40 meq via ORAL
  Filled 2017-05-17: qty 2

## 2017-05-17 MED ORDER — DIAZEPAM 2 MG PO TABS
2.0000 mg | ORAL_TABLET | Freq: Once | ORAL | Status: AC
  Filled 2017-05-17: qty 1

## 2017-05-17 NOTE — Progress Notes (Signed)
PROGRESS NOTE    Danielle [name]  WUJ:[id number] DOB: 04/04/1946 DOA: 05/11/2017 PCP: Patient, No Pcp Per   Brief Narrative: Danielle [name] is a 71 y.o. chemotherapy history of intracranial hemorrhage, hyperlipidemia, hypertension, COPD. She presents with lower extremity weakness and was found to have acute encephalopathy in addition to metabolic acidosis and AKI   Assessment & Plan:   Active Problems:   Hyponatremia   Acute encephalopathy   AKI (acute kidney injury) (HCC)   Leukocytosis   Metabolic acidosis   Lower extremity weakness   Hyperchloremic metabolic acidosis   Cholelithiasis   Acute encephalopathy Initially improved, now slightly worsened. Possible infectious component. CT head unremarkable. MRI motion degraded and without acute process. No new medications started, however, unsure if patient was taking all of her medications. Wondering if this could be secondary to prednisone vs infectious from probable UTI. Could also be waxing and waning from already diagnosed dementia. -urine culture pending -continue ceftriaxone  Acute kidney injury Resolved.  Non-anion gap metabolic acidosis Improved -repeat BMP  Tachypnea Resolved. CT chest unremarkable for etiology.  Hyponatremia Resolved with IV fluids.  Leukocytosis Improved slightly. procalcitonin trended down. Patient was prescribed steroids prior to arrival but possible she may not have been taking them. She has been afebrile this entire stay. No abscesses on CT imaging. -blood culture no growth to date  Lower extremity weakness -PT recommending SNF -Blood cultures pending  Mood disorder dementia -Continue Valium to PRN -discontinued Seroquel -continue effexor  Hyperlipidemia -continue Lipitor  Essential hypertension Stable. -continue metoprolol  COPD Stable. No exacerbation -continue Spiriva -continue Xopenex -continue chronic prednisone  Genital warts Unknown how chronic this is.  Asymptomatic. Unknown if patient still has cervix but she has had a hysterectomy. Unknown cervical cancer screening history. Would recommend outpatient follow-up for treatment and would likely benefit from cervical cancer screening if not up-to-date even though patient is greater than 77 years old.  Hypokalemia -Replace  Elevated Alkaline phosphatase Gallbladder wall thickening Possible gallbladder pathology. Asymptomatic. No gallstones seen on imaging. RUQ ultrasound significant for cholelithiasis -HIDA pending  Concern for decreased renal perfusion Seen on CT abdomen/pelvis. RA duplex without evidence of stenosis  Multiple thoracic compression fractures -symptomatic management -PT eval pending  Enteritis Possible source of infection. Afebrile. Procalcitonin trended down. -symptomatic management   DVT prophylaxis: Heparin subq Code Status: Full code Family Communication: Son in Social worker Disposition Plan: Discharge in several days to SNF   Consultants:   None  Procedures:   None  Antimicrobials:  Ceftriaxone   Subjective: Afebrile overnight.  Objective: Vitals:   05/16/17 2029 05/17/17 0509 05/17/17 0908 05/17/17 1011  BP: (!) 143/63 126/68 135/79   Pulse: 90 85 83   Resp: 16 16    Temp: 97.6 F (36.4 C) 98.2 F (36.8 C)    TempSrc: Oral Oral    SpO2: 99% 100%  99%  Weight:      Height:        Intake/Output Summary (Last 24 hours) at 05/17/17 1254 Last data filed at 05/17/17 0656  Gross per 24 hour  Intake               50 ml  Output              250 ml  Net             -200 ml   Filed Weights   05/11/17 1352 05/11/17 2134  Weight: 72.6 kg (160 lb) 79.5 kg (175 lb 4.3 oz)  Examination:  General exam: Appears calm and comfortable Respiratory system: Clear to auscultation bilaterally. Unlabored work of breathing. No wheezing or rales. Cardiovascular system: Regular rate and rhythm. Normal S1 and S2. No heart murmurs present. No extra heart  sounds Gastrointestinal system: Soft, non-tender, non-distended, no guarding, no rebound, no masses felt. Normal bowel sounds GU: multiple warts present along labia that run posterior towards perineum (exmined on 05/15/17) Central nervous system: Alert, oriented to person. Extremities: No edema. No calf tenderness Skin: No cyanosis. Multiple areas of ecchymoses. Psychiatry: Judgement and insight appear Impaired. Mood & affect depressed and flat.     Data Reviewed: I have personally reviewed following labs and imaging studies  CBC:  Recent Labs Lab 05/11/17 1426  05/13/17 1036 05/14/17 1038 05/15/17 0517 05/16/17 0432 05/17/17 0503  WBC 16.5*  < > 24.1* 25.2* 23.6* 21.6* 17.2*  NEUTROABS 14.0*  --   --   --   --   --   --   HGB 10.5*  < > 10.9* 9.9* 9.5* 9.4* 9.5*  HCT 31.9*  < > 33.3* 29.0* 28.4* 28.0* 29.0*  MCV 97.3  < > 95.7 95.1 95.9 95.9 97.0  PLT 461*  < > 582* 534* 568* 556* 580*  < > = values in this interval not displayed. Basic Metabolic Panel:  Recent Labs Lab 05/12/17 0508 05/13/17 1036 05/14/17 1038 05/15/17 0517 05/17/17 0503  NA 137 137 135 135 137  K 3.7 3.9 3.2* 3.7 3.3*  CL 113* 110 109 108 102  CO2 14* 19* 18* 20* 27  GLUCOSE 85 170* 138* 92 86  BUN 26* 17 15 12 6   CREATININE 1.07* 1.01* 0.96 0.68 0.59  CALCIUM 8.1* 8.7* 8.0* 8.0* 8.1*   GFR: Estimated Creatinine Clearance: 69.2 mL/min (by C-G formula based on SCr of 0.59 mg/dL). Liver Function Tests:  Recent Labs Lab 05/11/17 1426 05/15/17 0510  AST 40 24  ALT 34 25  ALKPHOS 222* 137*  BILITOT 0.3 0.4  PROT 6.0* 5.9*  ALBUMIN 2.8* 2.6*    Recent Labs Lab 05/11/17 1426  LIPASE 21   No results for input(s): AMMONIA in the last 168 hours. Coagulation Profile:  Recent Labs Lab 05/11/17 1426  INR 1.09   Cardiac Enzymes: No results for input(s): CKTOTAL, CKMB, CKMBINDEX, TROPONINI in the last 168 hours. BNP (last 3 results) No results for input(s): PROBNP in the last 8760  hours. HbA1C: No results for input(s): HGBA1C in the last 72 hours. CBG:  Recent Labs Lab 05/16/17 1259 05/17/17 0508  GLUCAP 107* 77   Lipid Profile: No results for input(s): CHOL, HDL, LDLCALC, TRIG, CHOLHDL, LDLDIRECT in the last 72 hours. Thyroid Function Tests: No results for input(s): TSH, T4TOTAL, FREET4, T3FREE, THYROIDAB in the last 72 hours. Anemia Panel: No results for input(s): VITAMINB12, FOLATE, FERRITIN, TIBC, IRON, RETICCTPCT in the last 72 hours. Sepsis Labs:  Recent Labs Lab 05/11/17 1614 05/13/17 1346 05/15/17 0517 05/17/17 0503  PROCALCITON  --  17.52 3.53 0.93  LATICACIDVEN 1.09  --   --   --     Recent Results (from the past 240 hour(s))  Urine culture     Status: None   Collection Time: 05/11/17  2:59 PM  Result Value Ref Range Status   Specimen Description URINE, CLEAN CATCH  Final   Special Requests NONE  Final   Culture   Final    NO GROWTH Performed at [facility], [address] [place], [place] [address]  Report Status 05/12/2017 FINAL  Final  MRSA PCR Screening     Status: None   Collection Time: 05/12/17  5:33 AM  Result Value Ref Range Status   MRSA by PCR NEGATIVE NEGATIVE Final    Comment:        The GeneXpert MRSA Assay (FDA approved for NASAL specimens only), is one component of a comprehensive MRSA colonization surveillance program. It is not intended to diagnose MRSA infection nor to guide or monitor treatment for MRSA infections.   Culture, blood (routine x 2)     Status: None (Preliminary result)   Collection Time: 05/13/17  1:46 PM  Result Value Ref Range Status   Specimen Description BLOOD RIGHT ANTECUBITAL  Final   Special Requests IN PEDIATRIC BOTTLE Blood Culture adequate volume  Final   Culture   Final    NO GROWTH 4 DAYS Performed at [facility], [address] [place], [place] [address]    Report Status PENDING  Incomplete  Culture, blood (routine x 2)     Status: None (Preliminary  result)   Collection Time: 05/13/17  1:53 PM  Result Value Ref Range Status   Specimen Description BLOOD RIGHT ANTECUBITAL  Final   Special Requests   Final    BOTTLES DRAWN AEROBIC AND ANAEROBIC Blood Culture adequate volume   Culture   Final    NO GROWTH 4 DAYS Performed at [facility], [address], [place], [place] [address]    Report Status PENDING  Incomplete  Culture, Urine     Status: Abnormal (Preliminary result)   Collection Time: 05/15/17  4:30 PM  Result Value Ref Range Status   Specimen Description URINE, CATHETERIZED  Final   Special Requests NONE  Final   Culture >=100,000 COLONIES/mL GRAM NEGATIVE RODS (A)  Final   Report Status PENDING  Incomplete         Radiology Studies: US Abdomen Limited Ruq  Result Date: 05/16/2017 CLINICAL DATA:  Increased LFTs EXAM: ULTRASOUND ABDOMEN LIMITED RIGHT UPPER QUADRANT COMPARISON:  None. FINDINGS: Gallbladder: There is a 6 mm stone in the gallbladder. The gallbladder wall is thickened measuring 8.5 mm. No pericholecystic fluid or Murphy's sign. Common bile duct: Diameter: 5.4 mm Liver: No focal lesion identified. Within normal limits in parenchymal echogenicity. Portal vein is patent on color Doppler imaging with normal direction of blood flow towards the liver. IMPRESSION: 1. There is a single 6 mm stone seen in the gallbladder. Wall thickening is identified as well measuring 8.5 mm. However, there is no pericholecystic fluid or Murphy's sign. If there is concern for acute cholecystitis, a HIDA scan could further evaluate. 2. No other abnormalities. Electronically Signed   By: Gerome Sam III M.D   On: 05/16/2017 10:11        Scheduled Meds: . atorvastatin  10 mg Oral QHS  . heparin  5,000 Units Subcutaneous Q8H  . iron polysaccharides  150 mg Oral Q breakfast  . metoprolol tartrate  25 mg Oral BID  . mometasone-formoterol  2 puff Inhalation BID  . predniSONE  2.5 mg Oral Q breakfast  . tiotropium  18 mcg  Inhalation Daily  . venlafaxine  75 mg Oral BID   Continuous Infusions: . cefTRIAXone (ROCEPHIN)  IV Stopped (05/16/17 2055)  . sodium chloride       LOS: 6 days     Jacquelin Hawking, MD [facility] 05/17/2017, 12:54 PM Pager: [phone]  If 7PM-7AM, please contact night-coverage [email or web address] Password  [email or web address] 05/17/2017, 12:54 PM

## 2017-05-17 NOTE — Plan of Care (Signed)
Problem: Safety: Goal: Ability to remain free from injury will improve Outcome: Completed/Met Date Met: 05/17/17 Bed alarm set to assist with safety. Education provided

## 2017-05-18 ENCOUNTER — Inpatient Hospital Stay (HOSPITAL_COMMUNITY): Payer: Medicare Other

## 2017-05-18 LAB — GLUCOSE, CAPILLARY: Glucose-Capillary: 78 mg/dL (ref 65–99)

## 2017-05-18 LAB — URINE CULTURE

## 2017-05-18 LAB — CBC
HEMATOCRIT: 28.9 % — AB (ref 36.0–46.0)
Hemoglobin: 9.4 g/dL — ABNORMAL LOW (ref 12.0–15.0)
MCH: 31.1 pg (ref 26.0–34.0)
MCHC: 32.5 g/dL (ref 30.0–36.0)
MCV: 95.7 fL (ref 78.0–100.0)
PLATELETS: 546 10*3/uL — AB (ref 150–400)
RBC: 3.02 MIL/uL — AB (ref 3.87–5.11)
RDW: 14.4 % (ref 11.5–15.5)
WBC: 13.7 10*3/uL — AB (ref 4.0–10.5)

## 2017-05-18 LAB — CULTURE, BLOOD (ROUTINE X 2)
CULTURE: NO GROWTH
CULTURE: NO GROWTH
Special Requests: ADEQUATE
Special Requests: ADEQUATE

## 2017-05-18 LAB — BASIC METABOLIC PANEL
ANION GAP: 7 (ref 5–15)
BUN: 7 mg/dL (ref 6–20)
CO2: 25 mmol/L (ref 22–32)
Calcium: 8.1 mg/dL — ABNORMAL LOW (ref 8.9–10.3)
Chloride: 103 mmol/L (ref 101–111)
Creatinine, Ser: 0.61 mg/dL (ref 0.44–1.00)
GFR calc Af Amer: >60 mL/min (ref >60)
GLUCOSE: 93 mg/dL (ref 65–99)
POTASSIUM: 3.3 mmol/L — AB (ref 3.5–5.1)
Sodium: 135 mmol/L (ref 135–145)

## 2017-05-18 MED ORDER — TECHNETIUM TC 99M MEBROFENIN IV KIT
5.0000 | PACK | Freq: Once | INTRAVENOUS | Status: AC | PRN
  Administered 2017-05-18: 5 via INTRAVENOUS

## 2017-05-18 MED ORDER — CEFAZOLIN SODIUM-DEXTROSE 1-4 GM/50ML-% IV SOLN
1.0000 g | Freq: Three times a day (TID) | INTRAVENOUS | Status: DC
  Administered 2017-05-18 – 2017-05-19 (×3): 1 g via INTRAVENOUS
  Filled 2017-05-18 (×5): qty 50

## 2017-05-18 MED ORDER — POTASSIUM CHLORIDE CRYS ER 20 MEQ PO TBCR
20.0000 meq | EXTENDED_RELEASE_TABLET | Freq: Once | ORAL | Status: AC
  Administered 2017-05-18: 20 meq via ORAL
  Filled 2017-05-18: qty 1

## 2017-05-18 MED ORDER — LORAZEPAM 0.5 MG PO TABS
0.5000 mg | ORAL_TABLET | Freq: Once | ORAL | Status: AC
  Administered 2017-05-18: 0.5 mg via ORAL
  Filled 2017-05-18: qty 1

## 2017-05-18 NOTE — Progress Notes (Signed)
CSW continuing to follow to assist with discharge planning back to patient's current ALF [facility] when patient is medically stable.   Celso Sickle, [place] Clinical Social Worker [facility] Cell#: [phone]

## 2017-05-18 NOTE — Care Management Important Message (Signed)
Important Message  Patient Details  Name: Danielle [name] MRN: [id number] Date of Birth: 1946/06/13   Medicare Important Message Given:  Yes    Caren Macadam 05/18/2017, 12:14 PMImportant Message  Patient Details  Name: Danielle [name] MRN: [id number] Date of Birth: 1945/11/18   Medicare Important Message Given:  Yes    Caren Macadam 05/18/2017, 12:14 PM

## 2017-05-18 NOTE — Progress Notes (Signed)
PT Cancellation Note  Patient Details Name: Danielle [name] MRN: [id number] DOB: 1945/11/07   Cancelled Treatment:    Reason Eval/Treat Not Completed: Patient at procedure or test/unavailable Pt not in room, location states Nuclear Medicine.  Will check back as schedule permits.   Joana Nolton,KATHrine E 05/18/2017, 1:34 PM Zenovia Jarred, PT, DPT 05/18/2017 Pager: [phone]

## 2017-05-18 NOTE — Progress Notes (Signed)
Patient ID: Danielle [name], female   DOB: November 13, 1945, 71 y.o.   MRN: [id number]                                                                PROGRESS NOTE                                                                                                                                                                                                             Patient Demographics:    Danielle [name], is a 71 y.o. female, DOB - 04/19/1946, BDZ:[id number]  Admit date - 05/11/2017   Admitting Physician Marinda Elk, MD  Outpatient Primary MD for the patient is Patient, No Pcp Per  LOS - 7  Outpatient Specialists:     Chief Complaint  Patient presents with  . Extremity Weakness       Brief Narrative     71 y.o. chemotherapy history of intracranial hemorrhage, hyperlipidemia, hypertension, COPD. She presents with lower extremity weakness and was found to have acute encephalopathy in addition to metabolic acidosis and AKI     Subjective:    Danielle [name] today has, slight left lower quadrant discomfort,  No RUQ pain.   axox1 which is her baseline.  Spoke with son in law who feels she is back to her baseline.   No headache, No chest pain, No abdominal pain - No Nausea, No new weakness tingling or numbness, No Cough - SOB.    Assessment  & Plan :    Active Problems:   Hyponatremia   Acute encephalopathy   AKI (acute kidney injury) (HCC)   Leukocytosis   Metabolic acidosis   Lower extremity weakness   Hyperchloremic metabolic acidosis   Cholelithiasis   Acute encephalopathy Initially improved, now slightly worsened. Possible infectious component. CT head unremarkable. MRI motion degraded and without acute process. No new medications started, however, unsure if patient was taking all of her medications. Wondering if this could be secondary to prednisone vs infectious from probable UTI. Could also be waxing and waning from already diagnosed dementia. -urine culture  8/24=> E. Coli sensitive to ancef D/c ceftriaxone  Start ancef, pharmacy to dose 8/27  Acute kidney injury Resolved.  Non-anion gap metabolic acidosis Improved -repeat BMP  Tachypnea Resolved. CT chest unremarkable for etiology.  Hyponatremia  Resolved with IV fluids.  Leukocytosis Improved slightly. procalcitonin trended down. Patient was prescribed steroids prior to arrival but possible she may not have been taking them. She has been afebrile this entire stay. No abscesses on CT imaging. -blood culture no growth to date  Lower extremity weakness -PT recommending SNF -Blood cultures pending  Mood disorder dementia -Continue Valium to PRN -discontinued Seroquel -continue effexor  Hyperlipidemia -continue Lipitor  Essential hypertension Stable. -continue metoprolol  COPD Stable. No exacerbation -continue Spiriva -continue Xopenex -continue chronic prednisone  Genital warts Unknown how chronic this is. Asymptomatic. Unknown if patient still has cervix but she has had a hysterectomy. Unknown cervical cancer screening history. Would recommend outpatient follow-up for treatment and would likely benefit from cervical cancer screening if not up-to-date even though patient is greater than 70 years old.  Hypokalemia -Replace  Elevated Alkaline phosphatase Gallbladder wall thickening Possible gallbladder pathology. Asymptomatic. No gallstones seen on imaging. RUQ ultrasound significant for cholelithiasis -HIDA 8/27=> negative  Concern for decreased renal perfusion Seen on CT abdomen/pelvis. RA duplex without evidence of stenosis  Multiple thoracic compression fractures -symptomatic management -PT eval pending  Enteritis Possible source of infection. Afebrile. Procalcitonin trended down. -symptomatic management   DVT prophylaxis: Heparin subq Code Status: Full code Family Communication: Son in law Disposition Plan: Discharge in several days  to SNF   Consultants:   None  Procedures:   None  Antimicrobials:  Ceftriaxone    Lab Results  Component Value Date   PLT 546 (H) 05/18/2017     Anti-infectives    Start     Dose/Rate Route Frequency Ordered Stop   05/18/17 2000  ceFAZolin (ANCEF) IVPB 1 g/50 mL premix     1 g 100 mL/hr over 30 Minutes Intravenous Every 8 hours 05/18/17 0925     05/15/17 2000  cefTRIAXone (ROCEPHIN) 1 g in dextrose 5 % 50 mL IVPB  Status:  Discontinued     1 g 100 mL/hr over 30 Minutes Intravenous Every 24 hours 05/15/17 1748 05/18/17 0925        Objective:   Vitals:   05/17/17 2100 05/18/17 0419 05/18/17 0838 05/18/17 1600  BP:  128/61  122/62  Pulse: 88 73  77  Resp: 20 16  16   Temp:  98.3 F (36.8 C)  98.5 F (36.9 C)  TempSrc:  Oral  Oral  SpO2: 95% 97% 94% 96%  Weight:      Height:        Wt Readings from Last 3 Encounters:  05/11/17 79.5 kg (175 lb 4.3 oz)  05/30/14 63.6 kg (140 lb 3.4 oz)     Intake/Output Summary (Last 24 hours) at 05/18/17 1750 Last data filed at 05/18/17 1641  Gross per 24 hour  Intake              890 ml  Output              350 ml  Net              540 ml     Physical Exam  Awake Alert, Oriented X 1, No new F.N deficits, Normal affect [place].AT,PERRAL Supple Neck,No JVD, No cervical lymphadenopathy appriciated.  Symmetrical Chest wall movement, Good air movement bilaterally, CTAB RRR,No Gallops,Rubs or new Murmurs, No Parasternal Heave +ve B.Sounds, Abd Soft, No tenderness, No organomegaly appriciated, No rebound - guarding or rigidity. No Cyanosis, Clubbing or edema, No new Rash or bruise      Data Review:  CBC  Recent Labs Lab 05/14/17 1038 05/15/17 0517 05/16/17 0432 05/17/17 0503 05/18/17 0506  WBC 25.2* 23.6* 21.6* 17.2* 13.7*  HGB 9.9* 9.5* 9.4* 9.5* 9.4*  HCT 29.0* 28.4* 28.0* 29.0* 28.9*  PLT 534* 568* 556* 580* 546*  MCV 95.1 95.9 95.9 97.0 95.7  MCH 32.5 32.1 32.2 31.8 31.1  MCHC 34.1 33.5 33.6 32.8  32.5  RDW 14.2 14.6 14.4 14.5 14.4    Chemistries   Recent Labs Lab 05/13/17 1036 05/14/17 1038 05/15/17 0510 05/15/17 0517 05/17/17 0503 05/18/17 0506  NA 137 135  --  135 137 135  K 3.9 3.2*  --  3.7 3.3* 3.3*  CL 110 109  --  108 102 103  CO2 19* 18*  --  20* 27 25  GLUCOSE 170* 138*  --  92 86 93  BUN 17 15  --  12 6 7   CREATININE 1.01* 0.96  --  0.68 0.59 0.61  CALCIUM 8.7* 8.0*  --  8.0* 8.1* 8.1*  AST  --   --  24  --   --   --   ALT  --   --  25  --   --   --   ALKPHOS  --   --  137*  --   --   --   BILITOT  --   --  0.4  --   --   --    ------------------------------------------------------------------------------------------------------------------ No results for input(s): CHOL, HDL, LDLCALC, TRIG, CHOLHDL, LDLDIRECT in the last 72 hours.  Lab Results  Component Value Date   HGBA1C 5.6 05/27/2014   ------------------------------------------------------------------------------------------------------------------ No results for input(s): TSH, T4TOTAL, T3FREE, THYROIDAB in the last 72 hours.  Invalid input(s): FREET3 ------------------------------------------------------------------------------------------------------------------ No results for input(s): VITAMINB12, FOLATE, FERRITIN, TIBC, IRON, RETICCTPCT in the last 72 hours.  Coagulation profile No results for input(s): INR, PROTIME in the last 168 hours.  No results for input(s): DDIMER in the last 72 hours.  Cardiac Enzymes No results for input(s): CKMB, TROPONINI, MYOGLOBIN in the last 168 hours.  Invalid input(s): CK ------------------------------------------------------------------------------------------------------------------ No results found for: BNP  Inpatient Medications  Scheduled Meds: . atorvastatin  10 mg Oral QHS  . heparin  5,000 Units Subcutaneous Q8H  . iron polysaccharides  150 mg Oral Q breakfast  . metoprolol tartrate  25 mg Oral BID  . mometasone-formoterol  2 puff  Inhalation BID  . predniSONE  2.5 mg Oral Q breakfast  . tiotropium  18 mcg Inhalation Daily  . venlafaxine  75 mg Oral BID   Continuous Infusions: .  ceFAZolin (ANCEF) IV    . sodium chloride     PRN Meds:.acetaminophen **OR** acetaminophen, diazepam, levalbuterol, lip balm, ondansetron **OR** ondansetron (ZOFRAN) IV, oxyCODONE  Micro Results Recent Results (from the past 240 hour(s))  Urine culture     Status: None   Collection Time: 05/11/17  2:59 PM  Result Value Ref Range Status   Specimen Description URINE, CLEAN CATCH  Final   Special Requests NONE  Final   Culture   Final    NO GROWTH Performed at [facility], [address], [place], [place] [address]    Report Status 05/12/2017 FINAL  Final  MRSA PCR Screening     Status: None   Collection Time: 05/12/17  5:33 AM  Result Value Ref Range Status   MRSA by PCR NEGATIVE NEGATIVE Final    Comment:        The GeneXpert MRSA Assay (FDA approved for  NASAL specimens only), is one component of a comprehensive MRSA colonization surveillance program. It is not intended to diagnose MRSA infection nor to guide or monitor treatment for MRSA infections.   Culture, blood (routine x 2)     Status: None   Collection Time: 05/13/17  1:46 PM  Result Value Ref Range Status   Specimen Description BLOOD RIGHT ANTECUBITAL  Final   Special Requests IN PEDIATRIC BOTTLE Blood Culture adequate volume  Final   Culture   Final    NO GROWTH 5 DAYS Performed at [facility], [address] [place], [place] [address]    Report Status 05/18/2017 FINAL  Final  Culture, blood (routine x 2)     Status: None   Collection Time: 05/13/17  1:53 PM  Result Value Ref Range Status   Specimen Description BLOOD RIGHT ANTECUBITAL  Final   Special Requests   Final    BOTTLES DRAWN AEROBIC AND ANAEROBIC Blood Culture adequate volume   Culture   Final    NO GROWTH 5 DAYS Performed at [facility], [address], [place],  [place] [address]    Report Status 05/18/2017 FINAL  Final  Culture, Urine     Status: Abnormal   Collection Time: 05/15/17  4:30 PM  Result Value Ref Range Status   Specimen Description URINE, CATHETERIZED  Final   Special Requests NONE  Final   Culture >=100,000 COLONIES/mL ESCHERICHIA COLI (A)  Final   Report Status 05/18/2017 FINAL  Final   Organism ID, Bacteria ESCHERICHIA COLI (A)  Final      Susceptibility   Escherichia coli - MIC*    AMPICILLIN 4 SENSITIVE Sensitive     CEFAZOLIN <=4 SENSITIVE Sensitive     CEFTRIAXONE <=1 SENSITIVE Sensitive     CIPROFLOXACIN <=0.25 SENSITIVE Sensitive     GENTAMICIN <=1 SENSITIVE Sensitive     IMIPENEM <=0.25 SENSITIVE Sensitive     NITROFURANTOIN <=16 SENSITIVE Sensitive     TRIMETH/SULFA <=20 SENSITIVE Sensitive     AMPICILLIN/SULBACTAM <=2 SENSITIVE Sensitive     PIP/TAZO <=4 SENSITIVE Sensitive     Extended ESBL NEGATIVE Sensitive     * >=100,000 COLONIES/mL ESCHERICHIA COLI    Radiology Reports Dg Chest 2 View  Result Date: 05/11/2017 CLINICAL DATA:  Cough, fatigue, and hypotension. EXAM: CHEST  2 VIEW COMPARISON:  Chest x-ray dated 04/06/2015 and chest CT dated 04/07/2015 FINDINGS: Heart size and pulmonary vascularity are normal. There is slight scarring as well as a prominent left apical pericardial fat pad at the left lung base laterally. No effusions or infiltrates. Multiple old compression fractures in the upper and midthoracic spine. No acute fractures. Aortic atherosclerosis. IMPRESSION: No active cardiopulmonary disease. Electronically Signed   By: Francene Boyers M.D.   On: 05/11/2017 15:24   Ct Head Wo Contrast  Result Date: 05/11/2017 CLINICAL DATA:  Hypotension. Sleeping a lot. Not feeling well. Focal neural deficit greater than 6 hours (deficit not specified). EXAM: CT HEAD WITHOUT CONTRAST TECHNIQUE: Contiguous axial images were obtained from the base of the skull through the vertex without intravenous contrast. COMPARISON:   02/14/2016 FINDINGS: Brain: No evidence of acute infarction, hemorrhage, hydrocephalus, extra-axial collection or mass lesion/mass effect. Remote small vessel infarcts in the right lateral lenticulostriate distribution, right thalamus, and likely left upper putamen. Remote small vessel infarcts in the left parietal white matter and upper left cerebellum. Cerebral volume loss with ventriculomegaly, stable. Vascular: Atherosclerotic calcification.  No hyperdense vessel. Skull: No acute or  aggressive finding. Sinuses/Orbits: Negative IMPRESSION: 1. No acute finding or change from 2017. 2. 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 Electronically Signed   By: Marnee Spring M.D.   On: 05/11/2017 15:38   Ct Chest W Contrast  Result Date: 05/14/2017 CLINICAL DATA:  Acute encephalopathy with metabolic acidosis and leukocytosis. Abdominal distention. EXAM: CT CHEST, ABDOMEN, AND PELVIS WITH CONTRAST TECHNIQUE: Multidetector CT imaging of the chest, abdomen and pelvis was performed following the standard protocol during bolus administration of intravenous contrast. CONTRAST:  ISOVUE-300 IOPAMIDOL (ISOVUE-300) INJECTION 61% COMPARISON:  None. FINDINGS: CT CHEST FINDINGS Cardiovascular: Heart size upper normal. Coronary artery calcification is evident. Atherosclerotic calcification is noted in the wall of the thoracic aorta. Mediastinum/Nodes: No mediastinal lymphadenopathy. There is no hilar lymphadenopathy. There is no axillary lymphadenopathy. The esophagus has normal imaging features. Lungs/Pleura: Fine detail obscured by respiratory motion. Scattered areas of subsegmental atelectasis. No pulmonary edema. No focal airspace consolidation. Calcified granuloma identified posterior left lower lobe. Musculoskeletal: Multiple nonacute right rib fractures evident. Bones are diffusely demineralized. Compression fractures are noted at T4, T5, T6, and T9. CT ABDOMEN PELVIS FINDINGS Hepatobiliary: No  focal abnormality within the liver parenchyma. Gallbladder is distended question gallbladder wall thickening and/or mild pericholecystic fluid. No gallstones are evident. No intrahepatic or extrahepatic biliary dilation. Pancreas: Pancreas diffusely atrophic. Spleen: No splenomegaly. No focal mass lesion. Adrenals/Urinary Tract: No adrenal nodule or mass. Right kidney unremarkable. Differentially decreased perfusion identified diffusely in the left kidney without hydronephrosis. No evidence for hydroureter. The urinary bladder appears normal for the degree of distention. Stomach/Bowel: Small hiatal hernia. Stomach otherwise unremarkable. Duodenum is normally positioned as is the ligament of Treitz. No small bowel wall thickening. No small bowel dilatation. The terminal ileum is normal. The appendix is not visualized, but there is no edema or inflammation in the region of the cecum. Left colon is decompressed with a segment of distal descending colon and proximal sigmoid colon showing subtle pericolonic edema/ inflammation. No substantial diverticular disease in this area. No abscess. Vascular/Lymphatic: There is abdominal aortic atherosclerosis without aneurysm. Portal vein and superior mesenteric vein are patent. The celiac axis, SMA and IMA are opacified. Reproductive: Uterus surgically absent.  There is no adnexal mass. Other: No intraperitoneal free fluid. Musculoskeletal: Marked pelvic floor laxity is evident. Bone windows reveal no worrisome lytic or sclerotic osseous lesions. IMPRESSION: 1. Edematous wall thickening with subtle pericolonic edema/inflammation associated with the distal descending and proximal sigmoid colon. Differential considerations would include infectious/inflammatory colitis. Short segment ischemic colitis cannot be excluded although major mesenteric arterial anatomy is patent proximally. No evidence for central mesenteric venous occlusion. 2. Distended gallbladder with apparent mild  gallbladder wall thickening and/or pericholecystic fluid. No gallstones are evident. Acute cholecystitis in the setting of non mineralized stones or acalculous cholecystitis would be considerations. Abdominal ultrasound may prove helpful to further evaluate as clinically warranted. 3. Differentially decreased perfusion of the left kidney without hydronephrosis to suggest obstructive uropathy. As such, renal artery stenosis would be a consideration. Left renal vein is patent. 4.  Aortic Atherosclerois (ICD10-170.0) Electronically Signed   By: Kennith Center M.D.   On: 05/14/2017 18:14   Mr Brain Wo Contrast  Result Date: 05/11/2017 CLINICAL DATA:  Lower extremity weakness and hypotension. EXAM: MRI HEAD WITHOUT CONTRAST TECHNIQUE: Multiplanar, multiecho pulse sequences of the brain and surrounding structures were obtained without intravenous contrast. COMPARISON:  Head CT 05/11/2017 Brain MRI 02/09/2016 FINDINGS: The study is degraded by motion, despite efforts to reduce this  artifact, including utilization of motion-resistant MR sequences. The findings of the study are interpreted in the context of reduced sensitivity/specificity. Brain: The midline structures are normal. There is no focal diffusion restriction to indicate acute infarct. Multiple bilateral basal ganglia old lacunar infarcts. No intraparenchymal hematoma or chronic microhemorrhage. Atrophy is advanced for age. The dura is normal and there is no extra-axial collection. Vascular: Major intracranial arterial and venous sinus flow voids are preserved. Skull and upper cervical spine: The visualized skull base, calvarium, upper cervical spine and extracranial soft tissues are normal. Sinuses/Orbits: No fluid levels or advanced mucosal thickening. No mastoid or middle ear effusion. Normal orbits. IMPRESSION: 1. Markedly motion degraded examination. Within that limitation, no acute abnormality. 2. Findings of chronic microvascular ischemia multiple old  basal ganglia lacunar infarcts. 3. Age advanced atrophy. Electronically Signed   By: Deatra Robinson M.D.   On: 05/11/2017 20:44   Ct Abdomen Pelvis W Contrast  Result Date: 05/14/2017 CLINICAL DATA:  Acute encephalopathy with metabolic acidosis and leukocytosis. Abdominal distention. EXAM: CT CHEST, ABDOMEN, AND PELVIS WITH CONTRAST TECHNIQUE: Multidetector CT imaging of the chest, abdomen and pelvis was performed following the standard protocol during bolus administration of intravenous contrast. CONTRAST:  ISOVUE-300 IOPAMIDOL (ISOVUE-300) INJECTION 61% COMPARISON:  None. FINDINGS: CT CHEST FINDINGS Cardiovascular: Heart size upper normal. Coronary artery calcification is evident. Atherosclerotic calcification is noted in the wall of the thoracic aorta. Mediastinum/Nodes: No mediastinal lymphadenopathy. There is no hilar lymphadenopathy. There is no axillary lymphadenopathy. The esophagus has normal imaging features. Lungs/Pleura: Fine detail obscured by respiratory motion. Scattered areas of subsegmental atelectasis. No pulmonary edema. No focal airspace consolidation. Calcified granuloma identified posterior left lower lobe. Musculoskeletal: Multiple nonacute right rib fractures evident. Bones are diffusely demineralized. Compression fractures are noted at T4, T5, T6, and T9. CT ABDOMEN PELVIS FINDINGS Hepatobiliary: No focal abnormality within the liver parenchyma. Gallbladder is distended question gallbladder wall thickening and/or mild pericholecystic fluid. No gallstones are evident. No intrahepatic or extrahepatic biliary dilation. Pancreas: Pancreas diffusely atrophic. Spleen: No splenomegaly. No focal mass lesion. Adrenals/Urinary Tract: No adrenal nodule or mass. Right kidney unremarkable. Differentially decreased perfusion identified diffusely in the left kidney without hydronephrosis. No evidence for hydroureter. The urinary bladder appears normal for the degree of distention. Stomach/Bowel:  Small hiatal hernia. Stomach otherwise unremarkable. Duodenum is normally positioned as is the ligament of Treitz. No small bowel wall thickening. No small bowel dilatation. The terminal ileum is normal. The appendix is not visualized, but there is no edema or inflammation in the region of the cecum. Left colon is decompressed with a segment of distal descending colon and proximal sigmoid colon showing subtle pericolonic edema/ inflammation. No substantial diverticular disease in this area. No abscess. Vascular/Lymphatic: There is abdominal aortic atherosclerosis without aneurysm. Portal vein and superior mesenteric vein are patent. The celiac axis, SMA and IMA are opacified. Reproductive: Uterus surgically absent.  There is no adnexal mass. Other: No intraperitoneal free fluid. Musculoskeletal: Marked pelvic floor laxity is evident. Bone windows reveal no worrisome lytic or sclerotic osseous lesions. IMPRESSION: 1. Edematous wall thickening with subtle pericolonic edema/inflammation associated with the distal descending and proximal sigmoid colon. Differential considerations would include infectious/inflammatory colitis. Short segment ischemic colitis cannot be excluded although major mesenteric arterial anatomy is patent proximally. No evidence for central mesenteric venous occlusion. 2. Distended gallbladder with apparent mild gallbladder wall thickening and/or pericholecystic fluid. No gallstones are evident. Acute cholecystitis in the setting of non mineralized stones or acalculous cholecystitis would be considerations.  Abdominal ultrasound may prove helpful to further evaluate as clinically warranted. 3. Differentially decreased perfusion of the left kidney without hydronephrosis to suggest obstructive uropathy. As such, renal artery stenosis would be a consideration. Left renal vein is patent. 4.  Aortic Atherosclerois (ICD10-170.0) Electronically Signed   By: Kennith Center M.D.   On: 05/14/2017 18:14   Nm  Hepato W/eject Fract  Result Date: 05/18/2017 CLINICAL DATA:  Abdominal pain. EXAM: NUCLEAR MEDICINE HEPATOBILIARY IMAGING WITH GALLBLADDER EF TECHNIQUE: Sequential images of the abdomen were obtained out to 60 minutes following intravenous administration of radiopharmaceutical. After oral ingestion of Ensure, gallbladder ejection fraction was determined. At 60 min, normal ejection fraction is greater than 33%. RADIOPHARMACEUTICALS:  5.0 mCi Tc-68m  Choletec IV COMPARISON:  Abdominal ultrasound of May 16, 2017 FINDINGS: Prompt uptake and biliary excretion of activity by the liver is seen. Gallbladder activity is visualized, consistent with patency of cystic duct. Biliary activity passes into small bowel, consistent with patent common bile duct. The patient was only able to drink 3 ounces of Ensure. Calculated gallbladder ejection fraction is 61%%. (Normal gallbladder ejection fraction with Ensure is greater than 33%.) the patient complained of pain both prior to beginning this study as well as following ingestion of Ensure. IMPRESSION: Normal hepatobiliary scan with normal gallbladder ejection fraction. Electronically Signed   By: David  [place] M.D.   On: 05/18/2017 16:06   Dg Chest Port 1 View  Result Date: 05/13/2017 CLINICAL DATA:  Rapid breathing. EXAM: PORTABLE CHEST 1 VIEW COMPARISON:  05/11/2017.  04/05/2015. FINDINGS: Mediastinum and hilar structures normal. Cardiomegaly with normal pulmonary vascularity.Mild left base subsegmental atelectasis and/or scarring. IMPRESSION: Mild left base subsegmental atelectasis and/or scarring. Chest is stable from prior exam . Electronically Signed   By: Maisie Fus  Register   On: 05/13/2017 12:15   US Abdomen Limited Ruq  Result Date: 05/16/2017 CLINICAL DATA:  Increased LFTs EXAM: ULTRASOUND ABDOMEN LIMITED RIGHT UPPER QUADRANT COMPARISON:  None. FINDINGS: Gallbladder: There is a 6 mm stone in the gallbladder. The gallbladder wall is thickened measuring 8.5 mm.  No pericholecystic fluid or Murphy's sign. Common bile duct: Diameter: 5.4 mm Liver: No focal lesion identified. Within normal limits in parenchymal echogenicity. Portal vein is patent on color Doppler imaging with normal direction of blood flow towards the liver. IMPRESSION: 1. There is a single 6 mm stone seen in the gallbladder. Wall thickening is identified as well measuring 8.5 mm. However, there is no pericholecystic fluid or Murphy's sign. If there is concern for acute cholecystitis, a HIDA scan could further evaluate. 2. No other abnormalities. Electronically Signed   By: Gerome Sam III M.D   On: 05/16/2017 10:11    Time Spent in minutes  30   Pearson Grippe M.D on 05/18/2017 at 5:50 PM  Between 7am to 7pm - Pager - [phone]  After 7pm go to [email or web address] - password [facility]  [facility] -  Office  [phone]

## 2017-05-19 DIAGNOSIS — K8020 Calculus of gallbladder without cholecystitis without obstruction: Secondary | ICD-10-CM

## 2017-05-19 MED ORDER — DIAZEPAM 5 MG PO TABS: 5.0000 mg | ORAL_TABLET | Freq: Three times a day (TID) | ORAL | 0 refills | Status: DC

## 2017-05-19 MED ORDER — OXYCODONE HCL 5 MG PO TABS: 5.0000 mg | ORAL_TABLET | Freq: Four times a day (QID) | ORAL | 0 refills | Status: AC | PRN

## 2017-05-19 NOTE — Discharge Summary (Signed)
Physician Discharge Summary  Danielle [name] RUE:[id number] DOB: 1945-09-28 DOA: 05/11/2017  PCP: Patient, No Pcp Per  Admit date: 05/11/2017 Discharge date: 05/19/2017  Admitted From: ALF Disposition: ALF  Recommendations for Outpatient Follow-up:  1. Follow up with PCP in 1 week 2. Please obtain BMP/CBC in one week  Home Health: PT/OT Equipment/Devices: None  Discharge Condition: Stable CODE STATUS: Full code Diet recommendation: Regular  Brief/Interim Summary:  Admission HPI written by Marinda Elk, MD   Chief Complaint: Weakness  HPI: Danielle [name] is a 71 y.o. female with medical history significant of past medical history of intracranial hemorrhage back in 2015, hyperlipidemia and hypertension that comes in for lower extremity weakness bilaterally distorted on the day prior to admission. At this point patient cannot provide a good history she slow to respond, and all she says is that she doesn't want to go back to the nursing home. As per chart she was sent here for lower extremity weakness, the staff at the facility checked her blood pressure and reports of blood pressure of 70 with no diastolic measurements, when EMS got there her blood pressure was 110/68, the status post radiation has been sleeping a lot about wanting to eat or move around. In route EMS given 250 mL bolus.  ED Course:  In the ED she was found mildly tachycardic with a blood pressure 120/70, mild hyponatremia and acute renal failure of 2.1 (with a baseline creatinine of less than one), leukocytosis of 16.5 with a left shift and thrombocytosis Chest x-ray CT of the head are unremarkable, EKG as below    Hospital course:  Acute toxic encephalopathy Improved. Secondary to UTI. CT head and MRI unremarkable for acute process. medications adjusted to reduce cognitive effects. Dementia playing a role. Patient has waxing and waning cognition at baseline.   UTI E. Coli. Treated with three days of  ceftriaxone. Given one dose of Ancef as well.  Acute kidney injury Resolved with IV fluids.  Non-anion gap metabolic acidosis Improved with resolution of kidney injury.  Tachypnea Resolved. CT chest unremarkable for etiology.  Hyponatremia Resolved with IV fluids.  Leukocytosis Secondary to UTI. Improved with treatment. Blood cultures no growth.  Lower extremity weakness Physical therapy evaluated in the hospital. Continued physical therapy/occupational therapy services on discharge.  Mood disorder Dementia Continued Valium to PRN. Seroquel held, but restarted on discharge. If worsening mental status, consider holding again.  Hyperlipidemia Continued Lipitor  Essential hypertension Stable. Continued metoprolol  COPD Stable. No exacerbation. Continued bronchodilator and Spiriva. Prednisone continued as well.  Genital warts Unknown how chronic this is. Asymptomatic. Unknown if patient still has cervix but she has had a hysterectomy. Unknown cervical cancer screening history. Would recommend outpatient follow-up for treatment and would likely benefit from cervical cancer screening if not up-to-date even though patient is greater than 80 years old.  Hypokalemia Replaced with supplementation. Recheck BMP as an outpatient.  Elevated Alkaline phosphatase Gallbladder wall thickening Possible gallbladder pathology. Asymptomatic. No gallstones seen on imaging. RUQ ultrasound significant for cholelithiasis. HIDA scan unremarkable. Possible she passed a stone. Surgery follow-up if she has recurrent concern.  Concern for decreased renal perfusion Seen on CT abdomen/pelvis. RA duplex without evidence of stenosis.  Multiple thoracic compression fractures Symptomatic management with analgesics.  Enteritis Possible source of infection. Afebrile. Procalcitonin trended down. No symptoms.  Discharge Diagnoses:  Active Problems:   Hyponatremia   Acute  encephalopathy   AKI (acute kidney injury) (HCC)   Leukocytosis   Metabolic acidosis  Lower extremity weakness   Hyperchloremic metabolic acidosis   Cholelithiasis    Discharge Instructions  Discharge Instructions    Call MD for:  difficulty breathing, headache or visual disturbances    Complete by:  As directed    Call MD for:  persistant dizziness or light-headedness    Complete by:  As directed    Call MD for:  temperature >100.4    Complete by:  As directed    Diet - low sodium heart healthy    Complete by:  As directed    Increase activity slowly    Complete by:  As directed      Allergies as of 05/19/2017   No Known Allergies     Medication List    TAKE these medications   acetaminophen 325 MG tablet Commonly known as:  TYLENOL Take 650 mg by mouth 3 (three) times daily. Scheduled   albuterol 108 (90 Base) MCG/ACT inhaler Commonly known as:  PROVENTIL HFA;VENTOLIN HFA Inhale 2 puffs into the lungs daily as needed for wheezing or shortness of breath.   ARTHRICREAM 10 % cream Generic drug:  trolamine salicylate Apply 1 application topically 2 (two) times daily as needed for muscle pain. May self administer   atorvastatin 10 MG tablet Commonly known as:  LIPITOR Take 10 mg by mouth at bedtime.   budesonide-formoterol 160-4.5 MCG/ACT inhaler Commonly known as:  SYMBICORT Inhale 2 puffs into the lungs 2 (two) times daily.   diazepam 5 MG tablet Commonly known as:  VALIUM Take 1 tablet (5 mg total) by mouth 3 (three) times daily.   FERREX 150 150 MG capsule Generic drug:  iron polysaccharides Take 150 mg by mouth daily with breakfast.   lisinopril 10 MG tablet Commonly known as:  PRINIVIL,ZESTRIL Take 10 mg by mouth daily with breakfast.   loperamide 2 MG capsule Commonly known as:  IMODIUM Take 2 mg by mouth 4 (four) times daily as needed for diarrhea or loose stools.   metoprolol tartrate 25 MG tablet Commonly known as:  LOPRESSOR Take 25 mg by  mouth 2 (two) times daily.   nystatin powder Generic drug:  nystatin Apply 1 g topically 2 (two) times daily as needed (for rash).   oxyCODONE 5 MG immediate release tablet Commonly known as:  Oxy IR/ROXICODONE Take 1 tablet (5 mg total) by mouth every 6 (six) hours as needed for breakthrough pain. What changed:  when to take this  reasons to take this   predniSONE 2.5 MG tablet Commonly known as:  DELTASONE Take 2.5 mg by mouth daily with breakfast.   QUEtiapine 25 MG tablet Commonly known as:  SEROQUEL Take 25 mg by mouth 2 (two) times daily.   tiotropium 18 MCG inhalation capsule Commonly known as:  SPIRIVA Place 18 mcg into inhaler and inhale daily.   venlafaxine 75 MG tablet Commonly known as:  EFFEXOR Take 75 mg by mouth 2 (two) times daily.   vitamin B-12 500 MCG tablet Commonly known as:  CYANOCOBALAMIN Take 500 mcg by mouth daily with breakfast.            Discharge Care Instructions        Start     Ordered   05/19/17 0000  diazepam (VALIUM) 5 MG tablet  3 times daily     05/19/17 1201   05/19/17 0000  oxyCODONE (OXY IR/ROXICODONE) 5 MG immediate release tablet  Every 6 hours PRN     05/19/17 1201   05/19/17 0000  Increase  activity slowly     05/19/17 1201   05/19/17 0000  Diet - low sodium heart healthy     05/19/17 1201   05/19/17 0000  Call MD for:  temperature >100.4     05/19/17 1201   05/19/17 0000  Call MD for:  difficulty breathing, headache or visual disturbances     05/19/17 1201   05/19/17 0000  Call MD for:  persistant dizziness or light-headedness     05/19/17 1201     Follow-up Information    [facility] Follow up.   Specialty:  Home Health Services Why:  [facility] physical therapy/occupational therapy Contact information: [address] [place] [address] [phone]          No Known Allergies  Consultations:  None   Procedures/Studies: Dg Chest 2 View  Result Date:  05/11/2017 CLINICAL DATA:  Cough, fatigue, and hypotension. EXAM: CHEST  2 VIEW COMPARISON:  Chest x-ray dated 04/06/2015 and chest CT dated 04/07/2015 FINDINGS: Heart size and pulmonary vascularity are normal. There is slight scarring as well as a prominent left apical pericardial fat pad at the left lung base laterally. No effusions or infiltrates. Multiple old compression fractures in the upper and midthoracic spine. No acute fractures. Aortic atherosclerosis. IMPRESSION: No active cardiopulmonary disease. Electronically Signed   By: Francene Boyers M.D.   On: 05/11/2017 15:24   Ct Head Wo Contrast  Result Date: 05/11/2017 CLINICAL DATA:  Hypotension. Sleeping a lot. Not feeling well. Focal neural deficit greater than 6 hours (deficit not specified). EXAM: CT HEAD WITHOUT CONTRAST TECHNIQUE: Contiguous axial images were obtained from the base of the skull through the vertex without intravenous contrast. COMPARISON:  02/14/2016 FINDINGS: Brain: No evidence of acute infarction, hemorrhage, hydrocephalus, extra-axial collection or mass lesion/mass effect. Remote small vessel infarcts in the right lateral lenticulostriate distribution, right thalamus, and likely left upper putamen. Remote small vessel infarcts in the left parietal white matter and upper left cerebellum. Cerebral volume loss with ventriculomegaly, stable. Vascular: Atherosclerotic calcification.  No hyperdense vessel. Skull: No acute or aggressive finding. Sinuses/Orbits: Negative IMPRESSION: 1. No acute finding or change from 2017. 2. 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 Electronically Signed   By: Marnee Spring M.D.   On: 05/11/2017 15:38   Ct Chest W Contrast  Result Date: 05/14/2017 CLINICAL DATA:  Acute encephalopathy with metabolic acidosis and leukocytosis. Abdominal distention. EXAM: CT CHEST, ABDOMEN, AND PELVIS WITH CONTRAST TECHNIQUE: Multidetector CT imaging of the chest, abdomen and pelvis was  performed following the standard protocol during bolus administration of intravenous contrast. CONTRAST:  ISOVUE-300 IOPAMIDOL (ISOVUE-300) INJECTION 61% COMPARISON:  None. FINDINGS: CT CHEST FINDINGS Cardiovascular: Heart size upper normal. Coronary artery calcification is evident. Atherosclerotic calcification is noted in the wall of the thoracic aorta. Mediastinum/Nodes: No mediastinal lymphadenopathy. There is no hilar lymphadenopathy. There is no axillary lymphadenopathy. The esophagus has normal imaging features. Lungs/Pleura: Fine detail obscured by respiratory motion. Scattered areas of subsegmental atelectasis. No pulmonary edema. No focal airspace consolidation. Calcified granuloma identified posterior left lower lobe. Musculoskeletal: Multiple nonacute right rib fractures evident. Bones are diffusely demineralized. Compression fractures are noted at T4, T5, T6, and T9. CT ABDOMEN PELVIS FINDINGS Hepatobiliary: No focal abnormality within the liver parenchyma. Gallbladder is distended question gallbladder wall thickening and/or mild pericholecystic fluid. No gallstones are evident. No intrahepatic or extrahepatic biliary dilation. Pancreas: Pancreas diffusely atrophic. Spleen: No splenomegaly. No focal mass lesion. Adrenals/Urinary Tract: No adrenal  nodule or mass. Right kidney unremarkable. Differentially decreased perfusion identified diffusely in the left kidney without hydronephrosis. No evidence for hydroureter. The urinary bladder appears normal for the degree of distention. Stomach/Bowel: Small hiatal hernia. Stomach otherwise unremarkable. Duodenum is normally positioned as is the ligament of Treitz. No small bowel wall thickening. No small bowel dilatation. The terminal ileum is normal. The appendix is not visualized, but there is no edema or inflammation in the region of the cecum. Left colon is decompressed with a segment of distal descending colon and proximal sigmoid colon showing subtle  pericolonic edema/ inflammation. No substantial diverticular disease in this area. No abscess. Vascular/Lymphatic: There is abdominal aortic atherosclerosis without aneurysm. Portal vein and superior mesenteric vein are patent. The celiac axis, SMA and IMA are opacified. Reproductive: Uterus surgically absent.  There is no adnexal mass. Other: No intraperitoneal free fluid. Musculoskeletal: Marked pelvic floor laxity is evident. Bone windows reveal no worrisome lytic or sclerotic osseous lesions. IMPRESSION: 1. Edematous wall thickening with subtle pericolonic edema/inflammation associated with the distal descending and proximal sigmoid colon. Differential considerations would include infectious/inflammatory colitis. Short segment ischemic colitis cannot be excluded although major mesenteric arterial anatomy is patent proximally. No evidence for central mesenteric venous occlusion. 2. Distended gallbladder with apparent mild gallbladder wall thickening and/or pericholecystic fluid. No gallstones are evident. Acute cholecystitis in the setting of non mineralized stones or acalculous cholecystitis would be considerations. Abdominal ultrasound may prove helpful to further evaluate as clinically warranted. 3. Differentially decreased perfusion of the left kidney without hydronephrosis to suggest obstructive uropathy. As such, renal artery stenosis would be a consideration. Left renal vein is patent. 4.  Aortic Atherosclerois (ICD10-170.0) Electronically Signed   By: Kennith Center M.D.   On: 05/14/2017 18:14   Mr Brain Wo Contrast  Result Date: 05/11/2017 CLINICAL DATA:  Lower extremity weakness and hypotension. EXAM: MRI HEAD WITHOUT CONTRAST TECHNIQUE: Multiplanar, multiecho pulse sequences of the brain and surrounding structures were obtained without intravenous contrast. COMPARISON:  Head CT 05/11/2017 Brain MRI 02/09/2016 FINDINGS: The study is degraded by motion, despite efforts to reduce this artifact,  including utilization of motion-resistant MR sequences. The findings of the study are interpreted in the context of reduced sensitivity/specificity. Brain: The midline structures are normal. There is no focal diffusion restriction to indicate acute infarct. Multiple bilateral basal ganglia old lacunar infarcts. No intraparenchymal hematoma or chronic microhemorrhage. Atrophy is advanced for age. The dura is normal and there is no extra-axial collection. Vascular: Major intracranial arterial and venous sinus flow voids are preserved. Skull and upper cervical spine: The visualized skull base, calvarium, upper cervical spine and extracranial soft tissues are normal. Sinuses/Orbits: No fluid levels or advanced mucosal thickening. No mastoid or middle ear effusion. Normal orbits. IMPRESSION: 1. Markedly motion degraded examination. Within that limitation, no acute abnormality. 2. Findings of chronic microvascular ischemia multiple old basal ganglia lacunar infarcts. 3. Age advanced atrophy. Electronically Signed   By: Deatra Robinson M.D.   On: 05/11/2017 20:44   Ct Abdomen Pelvis W Contrast  Result Date: 05/14/2017 CLINICAL DATA:  Acute encephalopathy with metabolic acidosis and leukocytosis. Abdominal distention. EXAM: CT CHEST, ABDOMEN, AND PELVIS WITH CONTRAST TECHNIQUE: Multidetector CT imaging of the chest, abdomen and pelvis was performed following the standard protocol during bolus administration of intravenous contrast. CONTRAST:  ISOVUE-300 IOPAMIDOL (ISOVUE-300) INJECTION 61% COMPARISON:  None. FINDINGS: CT CHEST FINDINGS Cardiovascular: Heart size upper normal. Coronary artery calcification is evident. Atherosclerotic calcification is noted in the wall of the  thoracic aorta. Mediastinum/Nodes: No mediastinal lymphadenopathy. There is no hilar lymphadenopathy. There is no axillary lymphadenopathy. The esophagus has normal imaging features. Lungs/Pleura: Fine detail obscured by respiratory motion.  Scattered areas of subsegmental atelectasis. No pulmonary edema. No focal airspace consolidation. Calcified granuloma identified posterior left lower lobe. Musculoskeletal: Multiple nonacute right rib fractures evident. Bones are diffusely demineralized. Compression fractures are noted at T4, T5, T6, and T9. CT ABDOMEN PELVIS FINDINGS Hepatobiliary: No focal abnormality within the liver parenchyma. Gallbladder is distended question gallbladder wall thickening and/or mild pericholecystic fluid. No gallstones are evident. No intrahepatic or extrahepatic biliary dilation. Pancreas: Pancreas diffusely atrophic. Spleen: No splenomegaly. No focal mass lesion. Adrenals/Urinary Tract: No adrenal nodule or mass. Right kidney unremarkable. Differentially decreased perfusion identified diffusely in the left kidney without hydronephrosis. No evidence for hydroureter. The urinary bladder appears normal for the degree of distention. Stomach/Bowel: Small hiatal hernia. Stomach otherwise unremarkable. Duodenum is normally positioned as is the ligament of Treitz. No small bowel wall thickening. No small bowel dilatation. The terminal ileum is normal. The appendix is not visualized, but there is no edema or inflammation in the region of the cecum. Left colon is decompressed with a segment of distal descending colon and proximal sigmoid colon showing subtle pericolonic edema/ inflammation. No substantial diverticular disease in this area. No abscess. Vascular/Lymphatic: There is abdominal aortic atherosclerosis without aneurysm. Portal vein and superior mesenteric vein are patent. The celiac axis, SMA and IMA are opacified. Reproductive: Uterus surgically absent.  There is no adnexal mass. Other: No intraperitoneal free fluid. Musculoskeletal: Marked pelvic floor laxity is evident. Bone windows reveal no worrisome lytic or sclerotic osseous lesions. IMPRESSION: 1. Edematous wall thickening with subtle pericolonic edema/inflammation  associated with the distal descending and proximal sigmoid colon. Differential considerations would include infectious/inflammatory colitis. Short segment ischemic colitis cannot be excluded although major mesenteric arterial anatomy is patent proximally. No evidence for central mesenteric venous occlusion. 2. Distended gallbladder with apparent mild gallbladder wall thickening and/or pericholecystic fluid. No gallstones are evident. Acute cholecystitis in the setting of non mineralized stones or acalculous cholecystitis would be considerations. Abdominal ultrasound may prove helpful to further evaluate as clinically warranted. 3. Differentially decreased perfusion of the left kidney without hydronephrosis to suggest obstructive uropathy. As such, renal artery stenosis would be a consideration. Left renal vein is patent. 4.  Aortic Atherosclerois (ICD10-170.0) Electronically Signed   By: Kennith Center M.D.   On: 05/14/2017 18:14   Nm Hepato W/eject Fract  Result Date: 05/18/2017 CLINICAL DATA:  Abdominal pain. EXAM: NUCLEAR MEDICINE HEPATOBILIARY IMAGING WITH GALLBLADDER EF TECHNIQUE: Sequential images of the abdomen were obtained out to 60 minutes following intravenous administration of radiopharmaceutical. After oral ingestion of Ensure, gallbladder ejection fraction was determined. At 60 min, normal ejection fraction is greater than 33%. RADIOPHARMACEUTICALS:  5.0 mCi Tc-23m  Choletec IV COMPARISON:  Abdominal ultrasound of May 16, 2017 FINDINGS: Prompt uptake and biliary excretion of activity by the liver is seen. Gallbladder activity is visualized, consistent with patency of cystic duct. Biliary activity passes into small bowel, consistent with patent common bile duct. The patient was only able to drink 3 ounces of Ensure. Calculated gallbladder ejection fraction is 61%%. (Normal gallbladder ejection fraction with Ensure is greater than 33%.) the patient complained of pain both prior to beginning this  study as well as following ingestion of Ensure. IMPRESSION: Normal hepatobiliary scan with normal gallbladder ejection fraction. Electronically Signed   By: David  [place] M.D.   On: 05/18/2017 16:06  Dg Chest Port 1 View  Result Date: 05/13/2017 CLINICAL DATA:  Rapid breathing. EXAM: PORTABLE CHEST 1 VIEW COMPARISON:  05/11/2017.  04/05/2015. FINDINGS: Mediastinum and hilar structures normal. Cardiomegaly with normal pulmonary vascularity.Mild left base subsegmental atelectasis and/or scarring. IMPRESSION: Mild left base subsegmental atelectasis and/or scarring. Chest is stable from prior exam . Electronically Signed   By: Maisie Fus  Register   On: 05/13/2017 12:15   US Abdomen Limited Ruq  Result Date: 05/16/2017 CLINICAL DATA:  Increased LFTs EXAM: ULTRASOUND ABDOMEN LIMITED RIGHT UPPER QUADRANT COMPARISON:  None. FINDINGS: Gallbladder: There is a 6 mm stone in the gallbladder. The gallbladder wall is thickened measuring 8.5 mm. No pericholecystic fluid or Murphy's sign. Common bile duct: Diameter: 5.4 mm Liver: No focal lesion identified. Within normal limits in parenchymal echogenicity. Portal vein is patent on color Doppler imaging with normal direction of blood flow towards the liver. IMPRESSION: 1. There is a single 6 mm stone seen in the gallbladder. Wall thickening is identified as well measuring 8.5 mm. However, there is no pericholecystic fluid or Murphy's sign. If there is concern for acute cholecystitis, a HIDA scan could further evaluate. 2. No other abnormalities. Electronically Signed   By: Gerome Sam III M.D   On: 05/16/2017 10:11     Subjective: No concerns today  Discharge Exam: Vitals:   05/19/17 1102 05/19/17 1216  BP:  (!) 152/61  Pulse:  98  Resp:    Temp:    SpO2: 97%    Vitals:   05/18/17 2019 05/19/17 0411 05/19/17 1102 05/19/17 1216  BP: (!) 95/58 (!) 147/83  (!) 152/61  Pulse: (!) 101 91  98  Resp: 18 18    Temp: 97.6 F (36.4 C) 97.9 F (36.6 C)     TempSrc: Oral Oral    SpO2: 99% 97% 97%   Weight:      Height:        General exam: Appears calm and comfortable Respiratory system: Clear to auscultation bilaterally. Unlabored work of breathing. No wheezing or rales. Cardiovascular system: Regular rate and rhythm. Normal S1 and S2. No heart murmurs present. No extra heart sounds Gastrointestinal system: Soft, non-tender, non-distended, no guarding, no rebound, no masses felt. Normal bowel sounds GU: multiple warts present along labia that run posterior towards perineum (exmined on 05/15/17) Central nervous system: Alert, oriented to person and place. Extremities: No edema. No calf tenderness Skin: No cyanosis. Multiple areas of ecchymoses. Psychiatry: Judgement and insight appear Impaired. Mood & affect depressed and flat.    The results of significant diagnostics from this hospitalization (including imaging, microbiology, ancillary and laboratory) are listed below for reference.     Microbiology: Recent Results (from the past 240 hour(s))  Urine culture     Status: None   Collection Time: 05/11/17  2:59 PM  Result Value Ref Range Status   Specimen Description URINE, CLEAN CATCH  Final   Special Requests NONE  Final   Culture   Final    NO GROWTH Performed at [facility], [address] [place], [place] [address]    Report Status 05/12/2017 FINAL  Final  MRSA PCR Screening     Status: None   Collection Time: 05/12/17  5:33 AM  Result Value Ref Range Status   MRSA by PCR NEGATIVE NEGATIVE Final    Comment:        The GeneXpert MRSA Assay (FDA approved for NASAL specimens only), is one component of a comprehensive MRSA colonization surveillance program.  It is not intended to diagnose MRSA infection nor to guide or monitor treatment for MRSA infections.   Culture, blood (routine x 2)     Status: None   Collection Time: 05/13/17  1:46 PM  Result Value Ref Range Status   Specimen Description BLOOD RIGHT  ANTECUBITAL  Final   Special Requests IN PEDIATRIC BOTTLE Blood Culture adequate volume  Final   Culture   Final    NO GROWTH 5 DAYS Performed at [facility], [address] [place], [place] [address]    Report Status 05/18/2017 FINAL  Final  Culture, blood (routine x 2)     Status: None   Collection Time: 05/13/17  1:53 PM  Result Value Ref Range Status   Specimen Description BLOOD RIGHT ANTECUBITAL  Final   Special Requests   Final    BOTTLES DRAWN AEROBIC AND ANAEROBIC Blood Culture adequate volume   Culture   Final    NO GROWTH 5 DAYS Performed at [facility], [address], [place], [place] [address]    Report Status 05/18/2017 FINAL  Final  Culture, Urine     Status: Abnormal   Collection Time: 05/15/17  4:30 PM  Result Value Ref Range Status   Specimen Description URINE, CATHETERIZED  Final   Special Requests NONE  Final   Culture >=100,000 COLONIES/mL ESCHERICHIA COLI (A)  Final   Report Status 05/18/2017 FINAL  Final   Organism ID, Bacteria ESCHERICHIA COLI (A)  Final      Susceptibility   Escherichia coli - MIC*    AMPICILLIN 4 SENSITIVE Sensitive     CEFAZOLIN <=4 SENSITIVE Sensitive     CEFTRIAXONE <=1 SENSITIVE Sensitive     CIPROFLOXACIN <=0.25 SENSITIVE Sensitive     GENTAMICIN <=1 SENSITIVE Sensitive     IMIPENEM <=0.25 SENSITIVE Sensitive     NITROFURANTOIN <=16 SENSITIVE Sensitive     TRIMETH/SULFA <=20 SENSITIVE Sensitive     AMPICILLIN/SULBACTAM <=2 SENSITIVE Sensitive     PIP/TAZO <=4 SENSITIVE Sensitive     Extended ESBL NEGATIVE Sensitive     * >=100,000 COLONIES/mL ESCHERICHIA COLI     Labs: BNP (last 3 results) No results for input(s): BNP in the last 8760 hours. Basic Metabolic Panel:  Recent Labs Lab 05/13/17 1036 05/14/17 1038 05/15/17 0517 05/17/17 0503 05/18/17 0506  NA 137 135 135 137 135  K 3.9 3.2* 3.7 3.3* 3.3*  CL 110 109 108 102 103  CO2 19* 18* 20* 27 25  GLUCOSE 170* 138* 92 86 93  BUN 17 15 12 6 7    CREATININE 1.01* 0.96 0.68 0.59 0.61  CALCIUM 8.7* 8.0* 8.0* 8.1* 8.1*   Liver Function Tests:  Recent Labs Lab 05/15/17 0510  AST 24  ALT 25  ALKPHOS 137*  BILITOT 0.4  PROT 5.9*  ALBUMIN 2.6*   No results for input(s): LIPASE, AMYLASE in the last 168 hours. No results for input(s): AMMONIA in the last 168 hours. CBC:  Recent Labs Lab 05/14/17 1038 05/15/17 0517 05/16/17 0432 05/17/17 0503 05/18/17 0506  WBC 25.2* 23.6* 21.6* 17.2* 13.7*  HGB 9.9* 9.5* 9.4* 9.5* 9.4*  HCT 29.0* 28.4* 28.0* 29.0* 28.9*  MCV 95.1 95.9 95.9 97.0 95.7  PLT 534* 568* 556* 580* 546*   Cardiac Enzymes: No results for input(s): CKTOTAL, CKMB, CKMBINDEX, TROPONINI in the last 168 hours. BNP: Invalid input(s): POCBNP CBG:  Recent Labs Lab 05/16/17 1259 05/17/17 0508 05/18/17 0812  GLUCAP 107* 77 78   D-Dimer  No results for input(s): DDIMER in the last 72 hours. Hgb A1c No results for input(s): HGBA1C in the last 72 hours. Lipid Profile No results for input(s): CHOL, HDL, LDLCALC, TRIG, CHOLHDL, LDLDIRECT in the last 72 hours. Thyroid function studies No results for input(s): TSH, T4TOTAL, T3FREE, THYROIDAB in the last 72 hours.  Invalid input(s): FREET3 Anemia work up No results for input(s): VITAMINB12, FOLATE, FERRITIN, TIBC, IRON, RETICCTPCT in the last 72 hours. Urinalysis    Component Value Date/Time   COLORURINE YELLOW 05/15/2017 1630   APPEARANCEUR HAZY (A) 05/15/2017 1630   LABSPEC 1.010 05/15/2017 1630   PHURINE 6.0 05/15/2017 1630   GLUCOSEU NEGATIVE 05/15/2017 1630   HGBUR SMALL (A) 05/15/2017 1630   BILIRUBINUR NEGATIVE 05/15/2017 1630   KETONESUR NEGATIVE 05/15/2017 1630   PROTEINUR NEGATIVE 05/15/2017 1630   UROBILINOGEN 0.2 05/25/2014 1855   NITRITE NEGATIVE 05/15/2017 1630   LEUKOCYTESUR LARGE (A) 05/15/2017 1630   Sepsis Labs Invalid input(s): PROCALCITONIN,  WBC,  LACTICIDVEN Microbiology Recent Results (from the past 240 hour(s))  Urine culture      Status: None   Collection Time: 05/11/17  2:59 PM  Result Value Ref Range Status   Specimen Description URINE, CLEAN CATCH  Final   Special Requests NONE  Final   Culture   Final    NO GROWTH Performed at [facility], [address], [place], [place] [address]    Report Status 05/12/2017 FINAL  Final  MRSA PCR Screening     Status: None   Collection Time: 05/12/17  5:33 AM  Result Value Ref Range Status   MRSA by PCR NEGATIVE NEGATIVE Final    Comment:        The GeneXpert MRSA Assay (FDA approved for NASAL specimens only), is one component of a comprehensive MRSA colonization surveillance program. It is not intended to diagnose MRSA infection nor to guide or monitor treatment for MRSA infections.   Culture, blood (routine x 2)     Status: None   Collection Time: 05/13/17  1:46 PM  Result Value Ref Range Status   Specimen Description BLOOD RIGHT ANTECUBITAL  Final   Special Requests IN PEDIATRIC BOTTLE Blood Culture adequate volume  Final   Culture   Final    NO GROWTH 5 DAYS Performed at [facility], [address], [place], [place] [address]    Report Status 05/18/2017 FINAL  Final  Culture, blood (routine x 2)     Status: None   Collection Time: 05/13/17  1:53 PM  Result Value Ref Range Status   Specimen Description BLOOD RIGHT ANTECUBITAL  Final   Special Requests   Final    BOTTLES DRAWN AEROBIC AND ANAEROBIC Blood Culture adequate volume   Culture   Final    NO GROWTH 5 DAYS Performed at [facility], [address], [place], [place] [address]    Report Status 05/18/2017 FINAL  Final  Culture, Urine     Status: Abnormal   Collection Time: 05/15/17  4:30 PM  Result Value Ref Range Status   Specimen Description URINE, CATHETERIZED  Final   Special Requests NONE  Final   Culture >=100,000 COLONIES/mL ESCHERICHIA COLI (A)  Final   Report Status 05/18/2017 FINAL  Final   Organism ID, Bacteria ESCHERICHIA COLI (A)  Final      Susceptibility    Escherichia coli - MIC*    AMPICILLIN 4 SENSITIVE Sensitive     CEFAZOLIN <=4 SENSITIVE Sensitive  CEFTRIAXONE <=1 SENSITIVE Sensitive     CIPROFLOXACIN <=0.25 SENSITIVE Sensitive     GENTAMICIN <=1 SENSITIVE Sensitive     IMIPENEM <=0.25 SENSITIVE Sensitive     NITROFURANTOIN <=16 SENSITIVE Sensitive     TRIMETH/SULFA <=20 SENSITIVE Sensitive     AMPICILLIN/SULBACTAM <=2 SENSITIVE Sensitive     PIP/TAZO <=4 SENSITIVE Sensitive     Extended ESBL NEGATIVE Sensitive     * >=100,000 COLONIES/mL ESCHERICHIA COLI     Time coordinating discharge: Over 30 minutes  SIGNED:   Jacquelin Hawking, MD [facility] 05/19/2017, 1:35 PM Pager [phone]  If 7PM-7AM, please contact night-coverage [email or web address] Password [email or web address]

## 2017-05-19 NOTE — NC FL2 (Signed)
[place] MEDICAID FL2 LEVEL OF CARE SCREENING TOOL     IDENTIFICATION  Patient Name: Danielle [name] Birthdate: 05/06/1946 Sex: female Admission Date (Current Location): 05/11/2017  [facility] [place] and [place] Number:  Producer, television/film/video and Address:  [facility],  [address], [place] [address]      Provider Number: [id number]  Attending Physician Name and Address:  Narda Bonds, MD  Relative Name and Phone Number:       Current Level of Care: Hospital Recommended Level of Care: Assisted Living Facility Prior Approval Number:    Date Approved/Denied:   PASRR Number: [id number]  Discharge Plan: Other (Comment) (Assisted Living Facility)    Current Diagnoses: Patient Active Problem List   Diagnosis Date Noted  . Cholelithiasis 05/16/2017  . Hyperchloremic metabolic acidosis 05/12/2017  . AKI (acute kidney injury) (HCC) 05/11/2017  . Leukocytosis 05/11/2017  . Metabolic acidosis 05/11/2017  . Lower extremity weakness 05/11/2017  . ICH (intracerebral hemorrhage) (HCC) 05/26/2014  . Hyponatremia 05/26/2014  . Acute encephalopathy 05/26/2014  . Sepsis (HCC) 05/26/2014  . CVA (cerebral vascular accident) (HCC) 05/25/2014  . Intracerebral hemorrhage (HCC) 05/25/2014    Orientation RESPIRATION BLADDER Height & Weight     Self  Normal Continent Weight: 175 lb 4.3 oz (79.5 kg) Height:  5\' 5"  (165.1 cm)  BEHAVIORAL SYMPTOMS/MOOD NEUROLOGICAL BOWEL NUTRITION STATUS      Continent Diet (regular)  AMBULATORY STATUS COMMUNICATION OF NEEDS Skin   Supervision Verbally Normal                       Personal Care Assistance Level of Assistance  Bathing, Feeding, Dressing Bathing Assistance: Limited assistance Feeding assistance: Independent Dressing Assistance: Limited assistance     Functional Limitations Info             SPECIAL CARE FACTORS FREQUENCY  PT (By licensed PT), OT (By licensed OT)     PT Frequency: HHPT OT Frequency: HHOT            Contractures Contractures Info: Not present    Additional Factors Info  Code Status, Allergies Code Status Info: Full Code Allergies Info: NKA              Discharge Medications: Please see discharge summary for a list of discharge medications.  Medication List             TAKE these medications            acetaminophen 325 MG tablet Commonly known as:  TYLENOL Take 650 mg by mouth 3 (three) times daily. Scheduled    albuterol 108 (90 Base) MCG/ACT inhaler Commonly known as:  PROVENTIL HFA;VENTOLIN HFA Inhale 2 puffs into the lungs daily as needed for wheezing or shortness of breath.    ARTHRICREAM 10 % cream Generic drug:  trolamine salicylate Apply 1 application topically 2 (two) times daily as needed for muscle pain. May self administer    atorvastatin 10 MG tablet Commonly known as:  LIPITOR Take 10 mg by mouth at bedtime.    budesonide-formoterol 160-4.5 MCG/ACT inhaler Commonly known as:  SYMBICORT Inhale 2 puffs into the lungs 2 (two) times daily.    diazepam 5 MG tablet Commonly known as:  VALIUM Take 1 tablet (5 mg total) by mouth 3 (three) times daily.    FERREX 150 150 MG capsule Generic drug:  iron polysaccharides Take 150 mg by mouth daily with breakfast.    lisinopril 10 MG tablet Commonly known  as:  PRINIVIL,ZESTRIL Take 10 mg by mouth daily with breakfast.    loperamide 2 MG capsule Commonly known as:  IMODIUM Take 2 mg by mouth 4 (four) times daily as needed for diarrhea or loose stools.    metoprolol tartrate 25 MG tablet Commonly known as:  LOPRESSOR Take 25 mg by mouth 2 (two) times daily.    nystatin powder Generic drug:  nystatin Apply 1 g topically 2 (two) times daily as needed (for rash).    oxyCODONE 5 MG immediate release tablet Commonly known as:  Oxy IR/ROXICODONE Take 1 tablet (5 mg total) by mouth every 6 (six) hours as needed for breakthrough pain. What changed:  when to take this  reasons  to take this    predniSONE 2.5 MG tablet Commonly known as:  DELTASONE Take 2.5 mg by mouth daily with breakfast.    QUEtiapine 25 MG tablet Commonly known as:  SEROQUEL Take 25 mg by mouth 2 (two) times daily.    tiotropium 18 MCG inhalation capsule Commonly known as:  SPIRIVA Place 18 mcg into inhaler and inhale daily.    venlafaxine 75 MG tablet Commonly known as:  EFFEXOR Take 75 mg by mouth 2 (two) times daily.    vitamin B-12 500 MCG tablet Commonly known as:  CYANOCOBALAMIN Take 500 mcg by mouth daily with breakfast.      Relevant Imaging Results:  Relevant Lab Results:   Additional Information SSN [id number]  Antionette Poles, LCSW

## 2017-05-19 NOTE — Progress Notes (Signed)
Patient returning to [facility]F. CSW sent all requested clinical documents, staff member Carmel Sacramento confirmed receipt and patient's ability to return. PTAR contacted, patient's family notified. Patient's RN can call report to [phone] second floor, packet complete. CSW signing off, no other needs identified. Please consult if new needs arise.   Celso Sickle, [place] Clinical Social Worker [facility] Cell#: [phone]

## 2017-05-19 NOTE — Care Management Note (Signed)
Case Management Note  Patient Details  Name: Danielle [name] MRN: [id number] Date of Birth: October 03, 1945  Subjective/Objective:PT recc SNF-per CSW patient can return back to ALF-they can provide the level of care needed. Return back to ALF-[facility] w/HHC-rep [facility] from [facility] already active-HHPT/OT ordered. No further CM needs.                    Action/Plan:d/c ALF w/HHC.   Expected Discharge Date:  05/19/17               Expected Discharge Plan:  Assisted Living / Rest Home  In-House Referral:  Clinical Social Work  Discharge planning Services  CM Consult  Post Acute Care Choice:  Home Health (Active w/[facility]-HHRN) Choice offered to:     DME Arranged:    DME Agency:     HH Arranged:  PT, OT HH Agency:  [facility]  Status of Service:  Completed, signed off  If discussed at Long Length of Stay Meetings, dates discussed:    Additional Comments:  Lanier Clam, RN 05/19/2017, 1:27 PM

## 2017-07-17 ENCOUNTER — Emergency Department (HOSPITAL_COMMUNITY): Payer: Medicare Other

## 2017-07-17 ENCOUNTER — Inpatient Hospital Stay (HOSPITAL_COMMUNITY)
Admission: EM | Admit: 2017-07-17 | Discharge: 2017-07-27 | DRG: 871 | Disposition: A | Discharge status: Home Health | Payer: Medicare Other | Attending: Internal Medicine | Admitting: Internal Medicine

## 2017-07-17 ENCOUNTER — Encounter (HOSPITAL_COMMUNITY): Payer: Self-pay | Admitting: Emergency Medicine

## 2017-07-17 DIAGNOSIS — R1010 Upper abdominal pain, unspecified: Secondary | ICD-10-CM | POA: Diagnosis not present

## 2017-07-17 DIAGNOSIS — E871 Hypo-osmolality and hyponatremia: Secondary | ICD-10-CM | POA: Diagnosis not present

## 2017-07-17 DIAGNOSIS — I10 Essential (primary) hypertension: Secondary | ICD-10-CM | POA: Diagnosis present

## 2017-07-17 DIAGNOSIS — Z8673 Personal history of transient ischemic attack (TIA), and cerebral infarction without residual deficits: Secondary | ICD-10-CM

## 2017-07-17 DIAGNOSIS — E669 Obesity, unspecified: Secondary | ICD-10-CM | POA: Diagnosis present

## 2017-07-17 DIAGNOSIS — A419 Sepsis, unspecified organism: Secondary | ICD-10-CM | POA: Diagnosis not present

## 2017-07-17 DIAGNOSIS — F17210 Nicotine dependence, cigarettes, uncomplicated: Secondary | ICD-10-CM | POA: Diagnosis present

## 2017-07-17 DIAGNOSIS — K567 Ileus, unspecified: Secondary | ICD-10-CM | POA: Diagnosis present

## 2017-07-17 DIAGNOSIS — G4700 Insomnia, unspecified: Secondary | ICD-10-CM | POA: Diagnosis present

## 2017-07-17 DIAGNOSIS — R451 Restlessness and agitation: Secondary | ICD-10-CM | POA: Diagnosis not present

## 2017-07-17 DIAGNOSIS — J9811 Atelectasis: Secondary | ICD-10-CM | POA: Diagnosis present

## 2017-07-17 DIAGNOSIS — F329 Major depressive disorder, single episode, unspecified: Secondary | ICD-10-CM | POA: Diagnosis present

## 2017-07-17 DIAGNOSIS — E878 Other disorders of electrolyte and fluid balance, not elsewhere classified: Secondary | ICD-10-CM | POA: Diagnosis not present

## 2017-07-17 DIAGNOSIS — Z6832 Body mass index (BMI) 32.0-32.9, adult: Secondary | ICD-10-CM

## 2017-07-17 DIAGNOSIS — F0391 Unspecified dementia with behavioral disturbance: Secondary | ICD-10-CM | POA: Diagnosis present

## 2017-07-17 DIAGNOSIS — J441 Chronic obstructive pulmonary disease with (acute) exacerbation: Secondary | ICD-10-CM | POA: Diagnosis present

## 2017-07-17 DIAGNOSIS — F419 Anxiety disorder, unspecified: Secondary | ICD-10-CM | POA: Diagnosis present

## 2017-07-17 DIAGNOSIS — E876 Hypokalemia: Secondary | ICD-10-CM | POA: Diagnosis present

## 2017-07-17 DIAGNOSIS — R627 Adult failure to thrive: Secondary | ICD-10-CM | POA: Diagnosis present

## 2017-07-17 DIAGNOSIS — E870 Hyperosmolality and hypernatremia: Secondary | ICD-10-CM | POA: Diagnosis present

## 2017-07-17 DIAGNOSIS — F03918 Unspecified dementia, unspecified severity, with other behavioral disturbance: Secondary | ICD-10-CM

## 2017-07-17 DIAGNOSIS — K219 Gastro-esophageal reflux disease without esophagitis: Secondary | ICD-10-CM | POA: Diagnosis present

## 2017-07-17 DIAGNOSIS — Z79899 Other long term (current) drug therapy: Secondary | ICD-10-CM

## 2017-07-17 DIAGNOSIS — K529 Noninfective gastroenteritis and colitis, unspecified: Secondary | ICD-10-CM | POA: Diagnosis present

## 2017-07-17 DIAGNOSIS — R109 Unspecified abdominal pain: Secondary | ICD-10-CM | POA: Diagnosis present

## 2017-07-17 DIAGNOSIS — J449 Chronic obstructive pulmonary disease, unspecified: Secondary | ICD-10-CM | POA: Diagnosis present

## 2017-07-17 DIAGNOSIS — N179 Acute kidney failure, unspecified: Secondary | ICD-10-CM | POA: Diagnosis present

## 2017-07-17 DIAGNOSIS — R1310 Dysphagia, unspecified: Secondary | ICD-10-CM | POA: Diagnosis present

## 2017-07-17 DIAGNOSIS — A09 Infectious gastroenteritis and colitis, unspecified: Secondary | ICD-10-CM | POA: Diagnosis present

## 2017-07-17 DIAGNOSIS — E785 Hyperlipidemia, unspecified: Secondary | ICD-10-CM | POA: Diagnosis present

## 2017-07-17 DIAGNOSIS — D62 Acute posthemorrhagic anemia: Secondary | ICD-10-CM | POA: Diagnosis present

## 2017-07-17 DIAGNOSIS — R6521 Severe sepsis with septic shock: Secondary | ICD-10-CM | POA: Diagnosis present

## 2017-07-17 DIAGNOSIS — E872 Acidosis: Secondary | ICD-10-CM | POA: Diagnosis present

## 2017-07-17 DIAGNOSIS — K2981 Duodenitis with bleeding: Secondary | ICD-10-CM | POA: Diagnosis present

## 2017-07-17 DIAGNOSIS — Z66 Do not resuscitate: Secondary | ICD-10-CM | POA: Diagnosis present

## 2017-07-17 DIAGNOSIS — R0600 Dyspnea, unspecified: Secondary | ICD-10-CM

## 2017-07-17 DIAGNOSIS — G92 Toxic encephalopathy: Secondary | ICD-10-CM | POA: Diagnosis present

## 2017-07-17 DIAGNOSIS — J9601 Acute respiratory failure with hypoxia: Secondary | ICD-10-CM | POA: Diagnosis present

## 2017-07-17 LAB — CBC WITH DIFFERENTIAL/PLATELET
Basophils Absolute: 0 10*3/uL (ref 0.0–0.1)
Basophils Relative: 0 %
EOS PCT: 0 %
Eosinophils Absolute: 0 10*3/uL (ref 0.0–0.7)
HCT: 30.3 % — ABNORMAL LOW (ref 36.0–46.0)
Hemoglobin: 9.6 g/dL — ABNORMAL LOW (ref 12.0–15.0)
LYMPHS ABS: 0.9 10*3/uL (ref 0.7–4.0)
LYMPHS PCT: 5 %
MCH: 32.9 pg (ref 26.0–34.0)
MCHC: 31.7 g/dL (ref 30.0–36.0)
MCV: 103.8 fL — AB (ref 78.0–100.0)
MONO ABS: 0.3 10*3/uL (ref 0.1–1.0)
MONOS PCT: 2 %
Neutro Abs: 17.6 10*3/uL — ABNORMAL HIGH (ref 1.7–7.7)
Neutrophils Relative %: 93 %
PLATELETS: 603 10*3/uL — AB (ref 150–400)
RBC: 2.92 MIL/uL — AB (ref 3.87–5.11)
RDW: 15.2 % (ref 11.5–15.5)
WBC: 18.9 10*3/uL — AB (ref 4.0–10.5)

## 2017-07-17 LAB — URINALYSIS, ROUTINE W REFLEX MICROSCOPIC
BILIRUBIN URINE: NEGATIVE
Glucose, UA: NEGATIVE mg/dL
Hgb urine dipstick: NEGATIVE
Ketones, ur: NEGATIVE mg/dL
Leukocytes, UA: NEGATIVE
Nitrite: NEGATIVE
PH: 5 (ref 5.0–8.0)
Protein, ur: NEGATIVE mg/dL
SPECIFIC GRAVITY, URINE: 1.009 (ref 1.005–1.030)

## 2017-07-17 LAB — I-STAT CG4 LACTIC ACID, ED
LACTIC ACID, VENOUS: 0.92 mmol/L (ref 0.5–1.9)
LACTIC ACID, VENOUS: 2.13 mmol/L — AB (ref 0.5–1.9)

## 2017-07-17 LAB — COMPREHENSIVE METABOLIC PANEL
ALBUMIN: 2.8 g/dL — AB (ref 3.5–5.0)
ALT: 13 U/L — AB (ref 14–54)
AST: 31 U/L (ref 15–41)
Alkaline Phosphatase: 170 U/L — ABNORMAL HIGH (ref 38–126)
Anion gap: 13 (ref 5–15)
BUN: 12 mg/dL (ref 6–20)
CHLORIDE: 109 mmol/L (ref 101–111)
CO2: 17 mmol/L — AB (ref 22–32)
Calcium: 8.6 mg/dL — ABNORMAL LOW (ref 8.9–10.3)
Creatinine, Ser: 1.74 mg/dL — ABNORMAL HIGH (ref 0.44–1.00)
GFR calc Af Amer: 33 mL/min — ABNORMAL LOW (ref >60)
GFR calc non Af Amer: 29 mL/min — ABNORMAL LOW (ref >60)
GLUCOSE: 101 mg/dL — AB (ref 65–99)
POTASSIUM: 3.1 mmol/L — AB (ref 3.5–5.1)
SODIUM: 139 mmol/L (ref 135–145)
Total Bilirubin: 0.8 mg/dL (ref 0.3–1.2)
Total Protein: 6.1 g/dL — ABNORMAL LOW (ref 6.5–8.1)

## 2017-07-17 LAB — BRAIN NATRIURETIC PEPTIDE: B Natriuretic Peptide: 174.6 pg/mL — ABNORMAL HIGH (ref 0.0–100.0)

## 2017-07-17 MED ORDER — SODIUM CHLORIDE 0.9 % IV BOLUS (SEPSIS)
2000.0000 mL | Freq: Once | INTRAVENOUS | Status: AC
  Administered 2017-07-17: 2000 mL via INTRAVENOUS

## 2017-07-17 MED ORDER — PIPERACILLIN-TAZOBACTAM 3.375 G IVPB 30 MIN
3.3750 g | Freq: Once | INTRAVENOUS | Status: AC
  Administered 2017-07-17: 3.375 g via INTRAVENOUS
  Filled 2017-07-17: qty 50

## 2017-07-17 MED ORDER — HYDROCORTISONE NA SUCCINATE PF 100 MG IJ SOLR
100.0000 mg | Freq: Once | INTRAMUSCULAR | Status: AC
  Administered 2017-07-17: 100 mg via INTRAVENOUS
  Filled 2017-07-17: qty 2

## 2017-07-17 MED ORDER — VANCOMYCIN HCL IN DEXTROSE 1-5 GM/200ML-% IV SOLN
1000.0000 mg | Freq: Once | INTRAVENOUS | Status: AC
  Administered 2017-07-17: 1000 mg via INTRAVENOUS
  Filled 2017-07-17: qty 200

## 2017-07-17 NOTE — ED Notes (Signed)
Bed: WA12 Expected date:  Expected time:  Means of arrival:  Comments: EMS 

## 2017-07-17 NOTE — ED Triage Notes (Signed)
Brought in by EMS from [facility] with chief complaint of hypotension---- pt's BP was 70/52 by EMS on scene.  Pt was given NS 200 ml bolus en route.  Pt also c/o generalized weakness and abdominal pain, no N/V/D---- pt reported that she took a laxative last night and has had good BM this morning.

## 2017-07-17 NOTE — ED Provider Notes (Signed)
[facility]-EMERGENCY DEPT Provider Note   CSN: [id number] Arrival date & time: 07/17/17  2107     History   Chief Complaint Chief Complaint  Patient presents with  . Hypotension    HPI Danielle [name] is a 71 y.o. female.  Patient complains of some abdominal pain and she was taken a laxative yesterday to help with it.  Patient is at a nursing home was found to have blood pressure low   The history is provided by the patient and the nursing home. No language interpreter was used.  Abdominal Pain   This is a new problem. The current episode started 2 days ago. The problem occurs constantly. The problem has not changed since onset.The pain is associated with an unknown factor. The pain is located in the epigastric region. The quality of the pain is dull. The pain is at a severity of 5/10. The pain is moderate. Pertinent negatives include anorexia, diarrhea, frequency, hematuria and headaches. Nothing aggravates the symptoms. Nothing relieves the symptoms.    Past Medical History:  Diagnosis Date  . Anemia   . Anxiety   . Arthritis    "some; all over" (05/29/2014)  . Asthma   . Chronic airway obstruction (HCC)   . COPD (chronic obstructive pulmonary disease) (HCC)   . Dementia   . Depression   . Encephalopathy   . Esophagitis   . Essential hypertension   . GERD (gastroesophageal reflux disease)   . GIB (gastrointestinal bleeding)   . HLD (hyperlipidemia)   . Hypopotassemia   . Hyposmolality and/or hyponatremia   . Insomnia   . Tobacco abuse     Patient Active Problem List   Diagnosis Date Noted  . Cholelithiasis 05/16/2017  . Hyperchloremic metabolic acidosis 05/12/2017  . AKI (acute kidney injury) (HCC) 05/11/2017  . Leukocytosis 05/11/2017  . Metabolic acidosis 05/11/2017  . Lower extremity weakness 05/11/2017  . ICH (intracerebral hemorrhage) (HCC) 05/26/2014  . Hyponatremia 05/26/2014  . Acute encephalopathy 05/26/2014  . Sepsis (HCC)  05/26/2014  . CVA (cerebral vascular accident) (HCC) 05/25/2014  . Intracerebral hemorrhage (HCC) 05/25/2014    Past Surgical History:  Procedure Laterality Date  . ABDOMINAL HYSTERECTOMY    . APPENDECTOMY    . CHOLECYSTECTOMY    . TONSILLECTOMY    . TUBAL LIGATION      OB History    No data available       Home Medications    Prior to Admission medications   Medication Sig Start Date End Date Taking? Authorizing Provider  albuterol (PROVENTIL HFA;VENTOLIN HFA) 108 (90 Base) MCG/ACT inhaler Inhale 1 puff into the lungs daily as needed for wheezing or shortness of breath.    Yes [provider]  budesonide-formoterol (SYMBICORT) 160-4.5 MCG/ACT inhaler Inhale 2 puffs into the lungs 2 (two) times daily.   Yes [provider]  loperamide (IMODIUM) 2 MG capsule Take 2 mg by mouth 4 (four) times daily as needed for diarrhea or loose stools.   Yes [provider]  nystatin (NYSTATIN) powder Apply 1 g topically 2 (two) times daily as needed (for rash).   Yes [provider]  oxyCODONE (OXY IR/ROXICODONE) 5 MG immediate release tablet Take 1 tablet (5 mg total) by mouth every 6 (six) hours as needed for breakthrough pain. 05/19/17  Yes Narda BondsNettey, Ralph A, MD  trolamine salicylate (ARTHRICREAM) 10 % cream Apply 1 application topically 2 (two) times daily as needed for muscle pain. May self administer   Yes [provider]  venlafaxine (EFFEXOR) 75 MG tablet Take 75 mg by mouth 2 (two) times daily.   Yes [provider]  vitamin B-12 (CYANOCOBALAMIN) 500 MCG tablet Take 500 mcg by mouth daily with breakfast.    Yes [provider]  diazepam (VALIUM) 5 MG tablet Take 1 tablet (5 mg total) by mouth 3 (three) times daily. Patient not taking: Reported on 07/17/2017 05/19/17   Narda Bonds, MD    Family History Family History  Problem Relation Age of Onset  . CVA Mother     Social History Social History  Substance Use Topics    . Smoking status: Current Every Day Smoker    Packs/day: 0.25    Years: 50.00    Types: Cigarettes  . Smokeless tobacco: Never Used  . Alcohol use No     Allergies   Patient has no known allergies.   Review of Systems Review of Systems  Constitutional: Negative for appetite change and fatigue.  HENT: Negative for congestion, ear discharge and sinus pressure.   Eyes: Negative for discharge.  Respiratory: Negative for cough.   Cardiovascular: Negative for chest pain.  Gastrointestinal: Positive for abdominal pain. Negative for anorexia and diarrhea.  Genitourinary: Negative for frequency and hematuria.  Musculoskeletal: Negative for back pain.  Skin: Negative for rash.  Neurological: Negative for seizures and headaches.  Psychiatric/Behavioral: Negative for hallucinations.     Physical Exam Updated Vital Signs BP (!) 97/57   Pulse 96   Temp 97.7 F (36.5 C) (Oral)   Resp 18   Ht 5\' 5"  (1.651 m)   Wt 74.8 kg (165 lb)   SpO2 96%   BMI 27.46 kg/m   Physical Exam  Constitutional: She is oriented to person, place, and time. She appears well-developed.  HENT:  Head: Normocephalic.  Eyes: Conjunctivae and EOM are normal. No scleral icterus.  Neck: Neck supple. No thyromegaly present.  Cardiovascular: Normal rate and regular rhythm.  Exam reveals no gallop and no friction rub.   No murmur heard. Pulmonary/Chest: No stridor. She has no wheezes. She has no rales. She exhibits no tenderness.  Abdominal: She exhibits distension. There is no tenderness. There is no rebound.  Musculoskeletal: Normal range of motion. She exhibits no edema.  Lymphadenopathy:    She has no cervical adenopathy.  Neurological: She is oriented to person, place, and time. She exhibits normal muscle tone. Coordination normal.  Skin: No rash noted. No erythema.  Psychiatric: She has a normal mood and affect. Her behavior is normal.     ED Treatments / Results  Labs (all labs ordered are  listed, but only abnormal results are displayed) Labs Reviewed  COMPREHENSIVE METABOLIC PANEL - Abnormal; Notable for the following:       Result Value   Potassium 3.1 (*)    CO2 17 (*)    Glucose, Bld 101 (*)    Creatinine, Ser 1.74 (*)    Calcium 8.6 (*)    Total Protein 6.1 (*)    Albumin 2.8 (*)    ALT 13 (*)    Alkaline Phosphatase 170 (*)    GFR calc non Af Amer 29 (*)    GFR calc Af Amer 33 (*)    All other components within normal limits  CBC WITH DIFFERENTIAL/PLATELET - Abnormal; Notable for the following:    WBC 18.9 (*)    RBC 2.92 (*)    Hemoglobin 9.6 (*)    HCT 30.3 (*)    MCV 103.8 (*)  Platelets 603 (*)    Neutro Abs 17.6 (*)    All other components within normal limits  BRAIN NATRIURETIC PEPTIDE - Abnormal; Notable for the following:    B Natriuretic Peptide 174.6 (*)    All other components within normal limits  I-STAT CG4 LACTIC ACID, ED - Abnormal; Notable for the following:    Lactic Acid, Venous 2.13 (*)    All other components within normal limits  CULTURE, BLOOD (ROUTINE X 2)  CULTURE, BLOOD (ROUTINE X 2)  URINALYSIS, ROUTINE W REFLEX MICROSCOPIC  LIPASE, BLOOD  I-STAT CG4 LACTIC ACID, ED  I-STAT CG4 LACTIC ACID, ED  I-STAT CG4 LACTIC ACID, ED    EKG  EKG Interpretation None       Radiology Dg Chest Port 1 View  Result Date: 07/17/2017 CLINICAL DATA:  Weakness.  Cough.  Shortness of breath.  Smoker. EXAM: PORTABLE CHEST 1 VIEW COMPARISON:  Chest CT dated 05/14/2017. Chest radiographs dated 05/13/2017. FINDINGS: Borderline enlarged cardiac silhouette. Clear lungs with normal vascularity. Old, healed right eighth rib fracture. Diffuse osteopenia. IMPRESSION: No acute abnormality. Electronically Signed   By: Beckie Salts M.D.   On: 07/17/2017 22:08   Ct Renal Stone Study  Result Date: 07/17/2017 CLINICAL DATA:  Hypotension abdominal pain EXAM: CT ABDOMEN AND PELVIS WITHOUT CONTRAST TECHNIQUE: Multidetector CT imaging of the abdomen and  pelvis was performed following the standard protocol without IV contrast. COMPARISON:  05/18/2017, 05/14/2017 FINDINGS: Lower chest: Lung bases demonstrate a calcified subpleural left lower lobe lung nodule unchanged. No acute consolidation or pleural effusion. Coronary calcification. Hepatobiliary: No focal hepatic abnormality. Dilated gallbladder without acute inflammation or calcified stone. No biliary dilatation Pancreas: Suggestion of mild soft tissue stranding and edema near head of pancreas. No ductal dilatation Spleen: Normal in size without focal abnormality. Adrenals/Urinary Tract: Adrenal glands are unremarkable. Kidneys are normal, without renal calculi, focal lesion, or hydronephrosis. Bladder is unremarkable. Stomach/Bowel: Small hiatal hernia. No dilated small bowel. Indistinct appearance of duodenum with mild soft tissue stranding in the region. Large amount of stool within the colon. Non identified appendix. Mild formed feces in the rectosigmoid colon. Edema/vascular congestion within the pericolonic fat of the rectosigmoid colon with suggestion of mild wall thickening of a segment of sigmoid colon, similar distribution compared to prior CT. Vascular/Lymphatic: Moderate atherosclerosis. No aneurysmal dilatation. Similar small lymph node at the GE junction. No pelvic adenopathy. Reproductive: Status post hysterectomy. No adnexal masses. Other: No free air or significant free fluid. Small fat in the umbilicus. Musculoskeletal: Degenerative changes. Chronic compression fracture at T8. IMPRESSION: 1. Mild edema/soft tissue stranding at the proximal duodenum/near head of pancreas. Findings could relate to duodenitis and or pancreatitis. Suggest correlation with enzymes. 2. Mild wall thickening of the sigmoid colon with hazy edema/inflammation in the pericolonic fat surrounding the rectosigmoid colon. Findings could be secondary to a mild colitis of infectious or inflammatory etiology, ischemic colitis  may also be considered given history of hypotension. Relatively large volume of stool in the colon and rectum. 3. Negative for hydronephrosis, intrarenal stone or ureteral stone. 4. Chronic compression fracture at T8 Electronically Signed   By: Jasmine Pang M.D.   On: 07/17/2017 23:03    Procedures Procedures (including critical care time)  Medications Ordered in ED Medications  sodium chloride 0.9 % bolus 2,000 mL (2,000 mLs Intravenous New Bag/Given 07/17/17 2202)  hydrocortisone sodium succinate (SOLU-CORTEF) 100 MG injection 100 mg (100 mg Intravenous Given 07/17/17 2203)  vancomycin (VANCOCIN) IVPB 1000 mg/200 mL premix (0  mg Intravenous Stopped 07/17/17 2334)  piperacillin-tazobactam (ZOSYN) IVPB 3.375 g (0 g Intravenous Stopped 07/17/17 2245)     Initial Impression / Assessment and Plan / ED Course  I have reviewed the triage vital signs and the nursing notes.  Pertinent labs & imaging results that were available during my care of the patient were reviewed by me and considered in my medical decision making (see chart for details).    CRITICAL CARE Performed by: Kodie Kishi L Total critical care time: 40 minutes Critical care time was exclusive of separately billable procedures and treating other patients. Critical care was necessary to treat or prevent imminent or life-threatening deterioration. Critical care was time spent personally by me on the following activities: development of treatment plan with patient and/or surrogate as well as nursing, discussions with consultants, evaluation of patient's response to treatment, examination of patient, obtaining history from patient or surrogate, ordering and performing treatments and interventions, ordering and review of laboratory studies, ordering and review of radiographic studies, pulse oximetry and re-evaluation of patient's condition. Patient with abdominal pain hypo-tension.  Suspect dehydration possible colitis patient is  responding to IV fluids.  Patient will be admitted to medicine    Final Clinical Impressions(s) / ED Diagnoses   Final diagnoses:  None    New Prescriptions New Prescriptions   No medications on file     Bethann Berkshire, MD 07/18/17 0000

## 2017-07-18 ENCOUNTER — Inpatient Hospital Stay (HOSPITAL_COMMUNITY): Payer: Medicare Other

## 2017-07-18 DIAGNOSIS — R1010 Upper abdominal pain, unspecified: Secondary | ICD-10-CM | POA: Diagnosis not present

## 2017-07-18 DIAGNOSIS — N179 Acute kidney failure, unspecified: Secondary | ICD-10-CM | POA: Diagnosis not present

## 2017-07-18 DIAGNOSIS — F329 Major depressive disorder, single episode, unspecified: Secondary | ICD-10-CM | POA: Diagnosis present

## 2017-07-18 DIAGNOSIS — E669 Obesity, unspecified: Secondary | ICD-10-CM | POA: Diagnosis present

## 2017-07-18 DIAGNOSIS — E871 Hypo-osmolality and hyponatremia: Secondary | ICD-10-CM | POA: Diagnosis not present

## 2017-07-18 DIAGNOSIS — F0391 Unspecified dementia with behavioral disturbance: Secondary | ICD-10-CM | POA: Diagnosis not present

## 2017-07-18 DIAGNOSIS — R1032 Left lower quadrant pain: Secondary | ICD-10-CM | POA: Diagnosis not present

## 2017-07-18 DIAGNOSIS — K529 Noninfective gastroenteritis and colitis, unspecified: Secondary | ICD-10-CM | POA: Diagnosis present

## 2017-07-18 DIAGNOSIS — R109 Unspecified abdominal pain: Secondary | ICD-10-CM | POA: Diagnosis not present

## 2017-07-18 DIAGNOSIS — A09 Infectious gastroenteritis and colitis, unspecified: Secondary | ICD-10-CM | POA: Diagnosis present

## 2017-07-18 DIAGNOSIS — J449 Chronic obstructive pulmonary disease, unspecified: Secondary | ICD-10-CM | POA: Diagnosis not present

## 2017-07-18 DIAGNOSIS — E876 Hypokalemia: Secondary | ICD-10-CM | POA: Diagnosis present

## 2017-07-18 DIAGNOSIS — J9811 Atelectasis: Secondary | ICD-10-CM | POA: Diagnosis present

## 2017-07-18 DIAGNOSIS — E870 Hyperosmolality and hypernatremia: Secondary | ICD-10-CM | POA: Diagnosis present

## 2017-07-18 DIAGNOSIS — R6510 Systemic inflammatory response syndrome (SIRS) of non-infectious origin without acute organ dysfunction: Secondary | ICD-10-CM

## 2017-07-18 DIAGNOSIS — I10 Essential (primary) hypertension: Secondary | ICD-10-CM | POA: Diagnosis present

## 2017-07-18 DIAGNOSIS — K219 Gastro-esophageal reflux disease without esophagitis: Secondary | ICD-10-CM | POA: Diagnosis present

## 2017-07-18 DIAGNOSIS — Z6832 Body mass index (BMI) 32.0-32.9, adult: Secondary | ICD-10-CM | POA: Diagnosis not present

## 2017-07-18 DIAGNOSIS — G92 Toxic encephalopathy: Secondary | ICD-10-CM | POA: Diagnosis present

## 2017-07-18 DIAGNOSIS — J9601 Acute respiratory failure with hypoxia: Secondary | ICD-10-CM | POA: Diagnosis not present

## 2017-07-18 DIAGNOSIS — D62 Acute posthemorrhagic anemia: Secondary | ICD-10-CM | POA: Diagnosis present

## 2017-07-18 DIAGNOSIS — K2981 Duodenitis with bleeding: Secondary | ICD-10-CM | POA: Diagnosis present

## 2017-07-18 DIAGNOSIS — J441 Chronic obstructive pulmonary disease with (acute) exacerbation: Secondary | ICD-10-CM | POA: Diagnosis present

## 2017-07-18 DIAGNOSIS — E872 Acidosis: Secondary | ICD-10-CM | POA: Diagnosis present

## 2017-07-18 DIAGNOSIS — K567 Ileus, unspecified: Secondary | ICD-10-CM | POA: Diagnosis present

## 2017-07-18 DIAGNOSIS — R1310 Dysphagia, unspecified: Secondary | ICD-10-CM | POA: Diagnosis present

## 2017-07-18 DIAGNOSIS — Z8673 Personal history of transient ischemic attack (TIA), and cerebral infarction without residual deficits: Secondary | ICD-10-CM | POA: Diagnosis not present

## 2017-07-18 DIAGNOSIS — F419 Anxiety disorder, unspecified: Secondary | ICD-10-CM | POA: Diagnosis present

## 2017-07-18 DIAGNOSIS — R6521 Severe sepsis with septic shock: Secondary | ICD-10-CM | POA: Diagnosis present

## 2017-07-18 DIAGNOSIS — A419 Sepsis, unspecified organism: Secondary | ICD-10-CM | POA: Diagnosis present

## 2017-07-18 LAB — CBC
HCT: 30 % — ABNORMAL LOW (ref 36.0–46.0)
HCT: 25.8 % — ABNORMAL LOW (ref 36.0–46.0)
HCT: 25.5 % — ABNORMAL LOW (ref 36.0–46.0)
HEMOGLOBIN: 8.4 g/dL — AB (ref 12.0–15.0)
Hemoglobin: 9.7 g/dL — ABNORMAL LOW (ref 12.0–15.0)
Hemoglobin: 8.3 g/dL — ABNORMAL LOW (ref 12.0–15.0)
MCH: 33.7 pg (ref 26.0–34.0)
MCH: 33.5 pg (ref 26.0–34.0)
MCH: 33.3 pg (ref 26.0–34.0)
MCHC: 32.3 g/dL (ref 30.0–36.0)
MCHC: 32.6 g/dL (ref 30.0–36.0)
MCHC: 32.5 g/dL (ref 30.0–36.0)
MCV: 104.2 fL — ABNORMAL HIGH (ref 78.0–100.0)
MCV: 102.8 fL — ABNORMAL HIGH (ref 78.0–100.0)
MCV: 102.4 fL — AB (ref 78.0–100.0)
PLATELETS: 525 10*3/uL — AB (ref 150–400)
PLATELETS: 517 10*3/uL — AB (ref 150–400)
Platelets: 483 10*3/uL — ABNORMAL HIGH (ref 150–400)
RBC: 2.88 MIL/uL — ABNORMAL LOW (ref 3.87–5.11)
RBC: 2.51 MIL/uL — ABNORMAL LOW (ref 3.87–5.11)
RBC: 2.49 MIL/uL — ABNORMAL LOW (ref 3.87–5.11)
RDW: 15.3 % (ref 11.5–15.5)
RDW: 15.3 % (ref 11.5–15.5)
RDW: 15.3 % (ref 11.5–15.5)
WBC: 25.5 10*3/uL — ABNORMAL HIGH (ref 4.0–10.5)
WBC: 24.3 10*3/uL — ABNORMAL HIGH (ref 4.0–10.5)
WBC: 23.7 10*3/uL — AB (ref 4.0–10.5)

## 2017-07-18 LAB — ECHOCARDIOGRAM COMPLETE
Height: 61 in
WEIGHTICAEL: 2758.4 [oz_av]

## 2017-07-18 LAB — BASIC METABOLIC PANEL
Anion gap: 14 (ref 5–15)
Anion gap: 10 (ref 5–15)
BUN: 11 mg/dL (ref 6–20)
BUN: 13 mg/dL (ref 6–20)
CALCIUM: 7.7 mg/dL — AB (ref 8.9–10.3)
CALCIUM: 7.6 mg/dL — AB (ref 8.9–10.3)
CO2: 15 mmol/L — ABNORMAL LOW (ref 22–32)
CO2: 17 mmol/L — ABNORMAL LOW (ref 22–32)
CREATININE: 1.47 mg/dL — AB (ref 0.44–1.00)
CREATININE: 1.43 mg/dL — AB (ref 0.44–1.00)
Chloride: 111 mmol/L (ref 101–111)
Chloride: 112 mmol/L — ABNORMAL HIGH (ref 101–111)
GFR calc Af Amer: 41 mL/min — ABNORMAL LOW (ref >60)
GFR, EST AFRICAN AMERICAN: 42 mL/min — AB (ref >60)
GFR, EST NON AFRICAN AMERICAN: 35 mL/min — AB (ref >60)
GFR, EST NON AFRICAN AMERICAN: 36 mL/min — AB (ref >60)
GLUCOSE: 144 mg/dL — AB (ref 65–99)
Glucose, Bld: 102 mg/dL — ABNORMAL HIGH (ref 65–99)
Potassium: 3.3 mmol/L — ABNORMAL LOW (ref 3.5–5.1)
Potassium: 3.7 mmol/L (ref 3.5–5.1)
SODIUM: 139 mmol/L (ref 135–145)
Sodium: 140 mmol/L (ref 135–145)

## 2017-07-18 LAB — PROTIME-INR
INR: 1.35
PROTHROMBIN TIME: 16.6 s — AB (ref 11.4–15.2)

## 2017-07-18 LAB — C DIFFICILE QUICK SCREEN W PCR REFLEX
C DIFFICILE (CDIFF) INTERP: NOT DETECTED
C DIFFICLE (CDIFF) ANTIGEN: NEGATIVE
C Diff toxin: NEGATIVE

## 2017-07-18 LAB — GLUCOSE, CAPILLARY: Glucose-Capillary: 71 mg/dL (ref 65–99)

## 2017-07-18 LAB — APTT: APTT: 45 s — AB (ref 24–36)

## 2017-07-18 LAB — LIPASE, BLOOD: LIPASE: 22 U/L (ref 11–51)

## 2017-07-18 LAB — LACTIC ACID, PLASMA
LACTIC ACID, VENOUS: 2.1 mmol/L — AB (ref 0.5–1.9)
Lactic Acid, Venous: 1.7 mmol/L (ref 0.5–1.9)

## 2017-07-18 LAB — MAGNESIUM: MAGNESIUM: 2 mg/dL (ref 1.7–2.4)

## 2017-07-18 LAB — MRSA PCR SCREENING: MRSA BY PCR: NEGATIVE

## 2017-07-18 LAB — PROCALCITONIN: PROCALCITONIN: 30.14 ng/mL

## 2017-07-18 LAB — FIBRINOGEN: Fibrinogen: 521 mg/dL — ABNORMAL HIGH (ref 210–475)

## 2017-07-18 MED ORDER — VANCOMYCIN 50 MG/ML ORAL SOLUTION
125.0000 mg | Freq: Four times a day (QID) | ORAL | Status: DC
  Administered 2017-07-18 (×2): 125 mg via ORAL
  Filled 2017-07-18 (×4): qty 2.5

## 2017-07-18 MED ORDER — POTASSIUM CHLORIDE CRYS ER 20 MEQ PO TBCR
40.0000 meq | EXTENDED_RELEASE_TABLET | Freq: Once | ORAL | Status: AC
  Administered 2017-07-18: 40 meq via ORAL
  Filled 2017-07-18: qty 2

## 2017-07-18 MED ORDER — DIAZEPAM 2 MG PO TABS
2.0000 mg | ORAL_TABLET | Freq: Three times a day (TID) | ORAL | Status: DC
  Administered 2017-07-18 – 2017-07-27 (×25): 2 mg via ORAL
  Filled 2017-07-18 (×25): qty 1

## 2017-07-18 MED ORDER — ALBUTEROL SULFATE (2.5 MG/3ML) 0.083% IN NEBU: 2.5000 mg | INHALATION_SOLUTION | Freq: Every day | RESPIRATORY_TRACT | Status: DC | PRN

## 2017-07-18 MED ORDER — LACTATED RINGERS IV SOLN
INTRAVENOUS | Status: DC
  Administered 2017-07-18 (×2): via INTRAVENOUS

## 2017-07-18 MED ORDER — VENLAFAXINE HCL 75 MG PO TABS
75.0000 mg | ORAL_TABLET | Freq: Two times a day (BID) | ORAL | Status: DC
  Administered 2017-07-18 – 2017-07-27 (×18): 75 mg via ORAL
  Filled 2017-07-18 (×21): qty 1

## 2017-07-18 MED ORDER — POTASSIUM CHLORIDE IN NACL 40-0.9 MEQ/L-% IV SOLN
INTRAVENOUS | Status: DC
  Administered 2017-07-18: 100 mL/h via INTRAVENOUS
  Filled 2017-07-18: qty 1000

## 2017-07-18 MED ORDER — MOMETASONE FURO-FORMOTEROL FUM 200-5 MCG/ACT IN AERO
2.0000 | INHALATION_SPRAY | Freq: Two times a day (BID) | RESPIRATORY_TRACT | Status: DC
  Administered 2017-07-18 – 2017-07-22 (×7): 2 via RESPIRATORY_TRACT
  Filled 2017-07-18: qty 8.8

## 2017-07-18 MED ORDER — HEPARIN SODIUM (PORCINE) 5000 UNIT/ML IJ SOLN
5000.0000 [IU] | Freq: Three times a day (TID) | INTRAMUSCULAR | Status: DC
  Administered 2017-07-18: 5000 [IU] via SUBCUTANEOUS
  Filled 2017-07-18: qty 1

## 2017-07-18 MED ORDER — ONDANSETRON HCL 4 MG/2ML IJ SOLN
4.0000 mg | Freq: Four times a day (QID) | INTRAMUSCULAR | Status: DC | PRN
  Administered 2017-07-21: 4 mg via INTRAVENOUS
  Filled 2017-07-18 (×2): qty 2

## 2017-07-18 MED ORDER — PANTOPRAZOLE SODIUM 40 MG IV SOLR: 40.0000 mg | Freq: Two times a day (BID) | INTRAVENOUS | Status: DC

## 2017-07-18 MED ORDER — NICOTINE 21 MG/24HR TD PT24
21.0000 mg | MEDICATED_PATCH | Freq: Every day | TRANSDERMAL | Status: DC
  Administered 2017-07-18 – 2017-07-27 (×10): 21 mg via TRANSDERMAL
  Filled 2017-07-18 (×10): qty 1

## 2017-07-18 MED ORDER — SODIUM CHLORIDE 0.9 % IV SOLN
INTRAVENOUS | Status: DC
  Administered 2017-07-18: 15:00:00 via INTRAVENOUS

## 2017-07-18 MED ORDER — SODIUM CHLORIDE 0.9 % IV SOLN
8.0000 mg/h | INTRAVENOUS | Status: DC
  Administered 2017-07-18 – 2017-07-20 (×5): 8 mg/h via INTRAVENOUS
  Filled 2017-07-18 (×9): qty 80

## 2017-07-18 MED ORDER — ONDANSETRON HCL 4 MG PO TABS: 4.0000 mg | ORAL_TABLET | Freq: Four times a day (QID) | ORAL | Status: DC | PRN

## 2017-07-18 MED ORDER — FUROSEMIDE 10 MG/ML IJ SOLN
40.0000 mg | Freq: Once | INTRAMUSCULAR | Status: AC
  Administered 2017-07-18: 40 mg via INTRAVENOUS
  Filled 2017-07-18: qty 4

## 2017-07-18 MED ORDER — OXYCODONE HCL 5 MG PO TABS
5.0000 mg | ORAL_TABLET | Freq: Four times a day (QID) | ORAL | Status: DC | PRN
  Administered 2017-07-18 – 2017-07-27 (×19): 5 mg via ORAL
  Filled 2017-07-18 (×20): qty 1

## 2017-07-18 MED ORDER — PIPERACILLIN-TAZOBACTAM 3.375 G IVPB
3.3750 g | Freq: Three times a day (TID) | INTRAVENOUS | Status: DC
  Administered 2017-07-18 – 2017-07-24 (×19): 3.375 g via INTRAVENOUS
  Filled 2017-07-18 (×21): qty 50

## 2017-07-18 MED ORDER — SODIUM CHLORIDE 0.9 % IV SOLN
80.0000 mg | Freq: Once | INTRAVENOUS | Status: AC
  Administered 2017-07-18: 19:00:00 80 mg via INTRAVENOUS
  Filled 2017-07-18: qty 80

## 2017-07-18 MED ORDER — VANCOMYCIN HCL IN DEXTROSE 750-5 MG/150ML-% IV SOLN
750.0000 mg | INTRAVENOUS | Status: DC
  Administered 2017-07-18 – 2017-07-19 (×2): 750 mg via INTRAVENOUS
  Filled 2017-07-18 (×4): qty 150

## 2017-07-18 MED ORDER — LACTATED RINGERS IV BOLUS (SEPSIS)
500.0000 mL | Freq: Once | INTRAVENOUS | Status: AC
  Administered 2017-07-18: 500 mL via INTRAVENOUS

## 2017-07-18 NOTE — Progress Notes (Signed)
Bladder scanned urine. Pt had 1 large black stool. MD updated. Cont to monitor

## 2017-07-18 NOTE — ED Notes (Signed)
Call report to [facility]CDawn, [place]RN at 01:45  [phone]

## 2017-07-18 NOTE — Progress Notes (Signed)
Pharmacy Antibiotic Note  Danielle [name] is a 71 y.o. female admitted on 07/17/2017 with possible colitis, received vancomycin 1 gram IV and was started on empiric Zosyn.  Worse today with more agitation and restlessness, increased work of breathing.  Being transferred to SDU, orders received to resume IV vancomycin and continue Zosyn for sepsis  Plan:  Resume IV vancomycin at 750 mg IV q24h  Continue Zosyn 3.375 grams IV q8h (4-hour infusion)  Daily SCr  Follow culture results, clinical course   Height: 5\' 1"  (154.9 cm) Weight: 172 lb 6.4 oz (78.2 kg) IBW/kg (Calculated) : 47.8  Temp (24hrs), Avg:98.5 F (36.9 C), Min:97.4 F (36.3 C), Max:100.5 F (38.1 C)   Recent Labs Lab 07/17/17 2142 07/17/17 2153 07/17/17 2352 07/18/17 0457  WBC 18.9*  --   --  25.5*  CREATININE 1.74*  --   --  1.47*  LATICACIDVEN  --  2.13* 0.92  --     Estimated Creatinine Clearance: 34.2 mL/min (A) (by C-G formula based on SCr of 1.47 mg/dL (H)).    No Known Allergies  Antimicrobials this admission: 10/26 BCx: collected 10/26 CDiff: negative/negative 10/27 MRSA PCR: negative   Dose adjustments this admission:    Thank you for allowing pharmacy to be a part of this patient's care.  Elie Goodyandy Amely Voorheis, PharmD, BCPS Pager: [phone] 07/18/2017  2:03 PM

## 2017-07-18 NOTE — Consult Note (Addendum)
[facility] Attending Consult  Physician Requesting Consult:  Mart Piggs, M.D. / [facility]  Date of Admission:  07/17/2017  Date of Consult:  07/18/2017  Reason for Consult/Chief Complaint:  Sepsis   History of Presenting Illness:  History obtained from the patient's rounding hospitalist as well as the electronic medical record given her altered mental status. 71 y.o. female with known history of prior CVA with intracranial hemorrhage who currently resides at a skilled nursing facility. Presented with several days' duration epigastric abdominal pain with weakness and poor oral intake. Patient has been experiencing watery diarrhea as well as nonbloody emesis. At her facility she was noted to be hypotensive and was subsequently sent to the emergency department.In the emergency department she was noted to have a leukocytosis and elevated serum creatinine and tachycardia. CT imaging was notable for possible duodenitis versus pancreatitis. Patient was subsequently started on IV vancomycin and Zosyn as well as empiric oral vancomycin for presumed C. Difficile colitis. Patient was noted to be more agitated and restless with increased work of breathing today prompting transfer to the stepdown unit. Per documentation from the patient's daughter she does get agitated and restless at times when she is ill. She also reported that her mother is still smoking cigarettes and would likely benefit from a nicotine patch. The patient is borderline hypotensive but notably did receive lasix 65m this morning at 11am as well as Oxycodone IR. Patient is endorsing back pain, abdominal pain, and leg pain.   Review of Systems: Unable to obtain given altered mental status.  No Known Allergies  No current facility-administered medications on file prior to encounter.    Current Outpatient Prescriptions on File Prior to Encounter  Medication Sig Dispense Refill  . albuterol (PROVENTIL HFA;VENTOLIN HFA) 108  (90 Base) MCG/ACT inhaler Inhale 1 puff into the lungs daily as needed for wheezing or shortness of breath.     . budesonide-formoterol (SYMBICORT) 160-4.5 MCG/ACT inhaler Inhale 2 puffs into the lungs 2 (two) times daily.    .Marland Kitchenloperamide (IMODIUM) 2 MG capsule Take 2 mg by mouth 4 (four) times daily as needed for diarrhea or loose stools.    . nystatin (NYSTATIN) powder Apply 1 g topically 2 (two) times daily as needed (for rash).    .Marland KitchenoxyCODONE (OXY IR/ROXICODONE) 5 MG immediate release tablet Take 1 tablet (5 mg total) by mouth every 6 (six) hours as needed for breakthrough pain. 10 tablet 0  . trolamine salicylate (ARTHRICREAM) 10 % cream Apply 1 application topically 2 (two) times daily as needed for muscle pain. May self administer    . venlafaxine (EFFEXOR) 75 MG tablet Take 75 mg by mouth 2 (two) times daily.    . vitamin B-12 (CYANOCOBALAMIN) 500 MCG tablet Take 500 mcg by mouth daily with breakfast.     . diazepam (VALIUM) 5 MG tablet Take 1 tablet (5 mg total) by mouth 3 (three) times daily. (Patient not taking: Reported on 07/17/2017) 10 tablet 0    Past Medical History:  Diagnosis Date  . Anemia   . Anxiety   . Arthritis    "some; all over" (05/29/2014)  . Asthma   . [place]   . COPD [place] obstructive [place] disease) [place]   . Dementia   . Depression   . Encephalopathy   . Esophagitis   . Essential hypertension   . GERD (gastroesophageal reflux disease)   . GIB (gastrointestinal bleeding)   . HLD (hyperlipidemia)   .  Hypopotassemia   . Hyposmolality and/or hyponatremia   . Insomnia   . Tobacco abuse     Past Surgical History:  Procedure Laterality Date  . ABDOMINAL HYSTERECTOMY    . APPENDECTOMY    . CHOLECYSTECTOMY    . TONSILLECTOMY    . TUBAL LIGATION      Family History  Problem Relation Age of Onset  . CVA Mother     Social History   Social History  . Marital status: Divorced    Spouse name: N/A  . Number of children:  N/A  . Years of education: N/A   Social History Main Topics  . Smoking status: Current Every Day Smoker    Packs/day: 0.25    Years: 50.00    Types: Cigarettes  . Smokeless tobacco: Never Used  . Alcohol use No  . Drug use: No  . Sexual activity: No   Other Topics Concern  . None   Social History Narrative  . None    Temp:  [97.4 F (36.3 C)-100.5 F (38.1 C)] 98.8 F (37.1 C) (10/27 1500) Pulse Rate:  [27-124] 38 (10/27 1530) Resp:  [18-38] 36 (10/27 1545) BP: (97-119)/(46-84) 107/46 (10/27 1530) SpO2:  [32 %-99 %] 99 % (10/27 1530) Weight:  [165 lb (74.8 kg)-172 lb 6.4 oz (78.2 kg)] 171 lb 4.8 oz (77.7 kg) (10/27 1500)  General:  No distress. No family at bedside. Awake.  Integument:  Warm & dry. No rash or bruising on exposed skin. Bruising of various ages on exposed skin of upper extremities. Extremities:  No cyanosis or clubbing.  Lymphatics:  No appreciated cervical or supraclavicular lymphadenoapthy. HEENT:  Dry mucus membranes. No oral ulcers. No scleral icterus.  Cardiovascular: Tachycardic with regular rhythm. Trace lower extremity edemaa. No JVD appreciated.  Telemetry:  Sinus tachycardia. Pulmonary:  Clear breath sounds bilaterally. Normal work of breathing on nasal cannula. Abdomen: Soft. Normoactive bowel sounds. Protuberant. No guarding.  Musculoskeletal:  Normal bulk. No joint deformity or effusion appreciated. Neurological:  No meningismus or nuchal rigidity. Following commands.  Psychiatric:  Not oriented to place, year, or president. Knows she is in the hospital.   LINES/TUBES: Foley >>> OGT >>> PIV  CBC Latest Ref Rng & Units 07/18/2017 07/18/2017 07/17/2017  WBC 4.0 - 10.5 K/uL 24.3(H) 25.5(H) 18.9(H)  Hemoglobin 12.0 - 15.0 g/dL 8.4(L) 9.7(L) 9.6(L)  Hematocrit 36.0 - 46.0 % 25.8(L) 30.0(L) 30.3(L)  Platelets 150 - 400 K/uL 525(H) 483(H) 603(H)   BMP Latest Ref Rng & Units 07/18/2017 07/18/2017 07/17/2017  Glucose 65 - 99 mg/dL 102(H)  144(H) 101(H)  BUN 6 - 20 mg/dL _0 Creatinine 0.44 - 1.00 mg/dL 1.43(H) 1.47(H) 1.74(H)  Sodium 135 - 145 mmol/L 139 140 139  Potassium 3.5 - 5.1 mmol/L 3.7 3.3(L) 3.1(L)  Chloride 101 - 111 mmol/L 112(H) 111 109  CO2 22 - 32 mmol/L 17(L) 15(L) 17(L)  Calcium 8.9 - 10.3 mg/dL 7.6(L) 7.7(L) 8.6(L)   Hepatic Function Latest Ref Rng & Units 07/17/2017 05/15/2017 05/11/2017  Total Protein 6.5 - 8.1 g/dL 6.1(L) 5.9(L) 6.0(L)  Albumin 3.5 - 5.0 g/dL 2.8(L) 2.6(L) 2.8(L)  AST 15 - 41 U/L 31 24 40  ALT 14 - 54 U/L 13(L) 25 34  Alk Phosphatase 38 - 126 U/L 170(H) 137(H) 222(H)  Total Bilirubin 0.3 - 1.2 mg/dL 0.8 0.4 0.3  Bilirubin, Direct 0.1 - 0.5 mg/dL - 0.1 -    IMAGING/STUDIES: CT RENAL STONE 10/26: IMPRESSION: 1. Mild edema/soft tissue stranding at the  proximal duodenum/near head of pancreas. Findings could relate to duodenitis and or pancreatitis. Suggest correlation with enzymes. 2. Mild wall thickening of the sigmoid colon with hazy edema/inflammation in the pericolonic fat surrounding the rectosigmoid colon. Findings could be secondary to a mild colitis of infectious or inflammatory etiology, ischemic colitis may also be considered given history of hypotension. Relatively large volume of stool in the colon and rectum. 3. Negative for hydronephrosis, intrarenal stone or ureteral stone. 4. Chronic compression fracture at T8 TTE 10/27:  Moderate LVH with normal cavity size. EF 65-70% with normal regional wall motion & grade 1 diastolic dysfunction. LA & RA normal in size. RV normal in size and function. No aortic stenosis or regurgitation. Aortic root normal in size. No mitral stenosis or regurgitation. No pulmonic stenosis. No tricuspid regurgitation. No pericardial effusion. CT CHEST W/O 10/27:  Personally reviewed by me. Compression of thoracic vertebral bodies noted. No pleural effusion. No pericardial effusion. No pleural thickening. No pathologic mediastinal adenopathy. No  parenchymal mass or consolidation.calcified nodules are present in left lower lobe.  MICROBIOLOGY: U/A 10/26:  Nitrite Negative/ Leukocytes Negative MRSA PCR 10/27:  Negative Stool C diff 10/27:  Negative Gastrointestinal Panel PCR 10/27 >>> Blood Cultures x2 10/26 >>>  ANTIBIOTICS: Vancomycin 10/27 >>> Zosyn 10/27 >>>  SIGNIFICANT EVENTS: 10/26 - Admit from SNF w/ hypotension & clinical picture of SIRS vs Sepsis 10/27 - Transfer to SDU/ICU w/ worsening mental status, tachypnea, &  Possible GIB  ASSESSMENT/PLAN:  71 y.o. female admitted with abdominal pain, acute renal failure, and clinical picture of SIRS with probable sepsis given elevated Procalcitonin. Patient is having melanotic stools per bedside nurse. Physical exam consistent with hypovolemia and especially given the patient's Lasix this morning I suspect this is likely a factor in her borderline hypotension. Her tachypnea is likely secondary to her borderline hypotension and tachycardia. I see no evidence of any pneumonia on chest CT imaging and her urinalysis does not suggest an infectious etiology. Certainly the abnormalities noted on her CT scan are the likely source.  1. SIRS/Probable Sepsis: Continuing to trend lactic acid and Procalcitonin. LR 500 mL bolus then continuing maintenance IV fluids at 100 mL per hour. Awaiting finalization of blood cultures and gastrointestinal PCR panel. Agree with continuing empiric vancomycin and Zosyn. 2. Acute hypoxic respiratory failure: Likely some element of atelectasis. Recommend continuing supplemental oxygen to maintain saturation greater than 92%. 3. Probable duodenitis: Normal lipase rules out pancreatitis. Continuing empiric antibiotics as above. Ordering ice chips and sips only. Gastrointestinal PCR panel pending. 4. Acute renal failure: Continuing to monitor urine output. Trending electrolytes and renal function with a.m. Labs. Avoiding nephrotoxic agents and further  diuretics. 5. Acute encephalopathy: Likely multifactorial but with a component of toxic metabolic encephalopathy. Avoiding sedating medications. Holding oxycodone.  6. Anemia: Chronic.Likely some element of gastrointestinal bleeding given melena. Starting Protonix drip after Protonix bolus. Trending hemoglobin with CBC. Checking stat coags and type and screen. 7. Hypokalemia: Resolved. 8. Metabolic acidosis: Likely secondary to elevated lactic acid. Continuing to trend lactic acid and serum bicarbonate. 9. History of anxiety & dementia: Continue Effexor. Continuing Valium 63m TID (home dose 555mTID).  10. History of COPD/asthma: No signs of acute exacerbation. Continuing twice a day Dulerain place of home Symbicort. 11. Tobacco use disorder:Nicotine patch already ordered by hospitalist.  Prophylaxis:  SCDs & starting Protonix drip after bolus. Diet:  NPO except ice chips & sips with meds.  Code Status:  DNR/DNI per my discussion with patient's daughter &  son-in-law this evening.  Disposition:  Patient transferred to SDU/ICU.  Family Update:  No family at bedside at the time of my exam. Daughter updated via phone.  I have personally spent a total of 32 minutes of critical care time today caring for the patient & reviewing the patient's electronic medical record.  Sonia Baller Ashok Cordia, M.D. [facility] Pager:  [phone] After 7pm or if no response, call [phone] 4:08 PM 07/18/17

## 2017-07-18 NOTE — H&P (Signed)
History and Physical    Danielle [name] EXB:[id number] DOB: 06/05/1946 DOA: 07/17/2017  PCP: Patient, No Pcp Per   Patient coming from: SNF- [facility].  Chief Complaint: Abdominal pain  HPI: Danielle [name] is a 71 y.o. female with medical history significant for CVA,, Intra-cranial hrr, who presented to the Ed from SNF complaints of abdominal pain, weakness, of 2 days' duration.patient's blood pressure was noted to be 70/52, pt, was subsequently sent to the ED, he was given IV fluid bolus and routes by EMS.  Abdominal pain- mostly lower abdomen. Patient denies dysuria.  Patient was constipated yesterday, was given a laxative with good bowel movement, the patient reports to me that she has had several episodes of loose stools today, and 2 episodes of vomiting. Patient denies shortness of breath or cough, no chest pain, no documented fevers or chills at SNF.  ED Course: pulse 111, blood pressure soft 97/54, potassium was low at 3.1, creatinine was markedly elevated from baseline at 1.7, ALP elevated - 170, lactic acid was minimally elevated at 2.1 which resolved with IV fluids >>0.9.WBC elevated at 18.9, hemoglobin low but stable at 9.6, platelets elevated at 603. Lipase was normal at 22. Blood cultures were drawn. CT renal stone study- possible duodenitis, and/or pancreatitis, mild colitis. Portable chest x-ray no acute abnormality. Patient was started on broad-spectrum antibiotics vancomycin and Zosyn for sepsis. Patient received 2 L bolus in ED. Patient was also given IV Solu-Medrol- EDP felt Symptoms may be related to patient's chronic steroids- though this is not on patient's meds list.  Review of Systems: As per HPI otherwise 10 point review of systems negative.   Past Medical History:  Diagnosis Date  . Anemia   . Anxiety   . Arthritis    "some; all over" (05/29/2014)  . Asthma   . Chronic airway obstruction ([place])   . COPD (chronic obstructive pulmonary disease) ([place])   .  Dementia   . Depression   . Encephalopathy   . Esophagitis   . Essential hypertension   . GERD (gastroesophageal reflux disease)   . GIB (gastrointestinal bleeding)   . HLD (hyperlipidemia)   . Hypopotassemia   . Hyposmolality and/or hyponatremia   . Insomnia   . Tobacco abuse     Past Surgical History:  Procedure Laterality Date  . ABDOMINAL HYSTERECTOMY    . APPENDECTOMY    . CHOLECYSTECTOMY    . TONSILLECTOMY    . TUBAL LIGATION      reports that she has been smoking Cigarettes.  She has a 12.50 pack-year smoking history. She has never used smokeless tobacco. She reports that she does not drink alcohol or use drugs.  No Known Allergies  Family History  Problem Relation Age of Onset  . CVA Mother     Prior to Admission medications   Medication Sig Start Date End Date Taking? Authorizing Provider  albuterol (PROVENTIL HFA;VENTOLIN HFA) 108 (90 Base) MCG/ACT inhaler Inhale 1 puff into the lungs daily as needed for wheezing or shortness of breath.    Yes [provider]  budesonide-formoterol (SYMBICORT) 160-4.5 MCG/ACT inhaler Inhale 2 puffs into the lungs 2 (two) times daily.   Yes [provider]  loperamide (IMODIUM) 2 MG capsule Take 2 mg by mouth 4 (four) times daily as needed for diarrhea or loose stools.   Yes [provider]  nystatin (NYSTATIN) powder Apply 1 g topically 2 (two) times daily as needed (for rash).   Yes [provider]  oxyCODONE (  OXY IR/ROXICODONE) 5 MG immediate release tablet Take 1 tablet (5 mg total) by mouth every 6 (six) hours as needed for breakthrough pain. 05/19/17  Yes Mariel Aloe, MD  trolamine salicylate (ARTHRICREAM) 10 % cream Apply 1 application topically 2 (two) times daily as needed for muscle pain. May self administer   Yes [provider]  venlafaxine (EFFEXOR) 75 MG tablet Take 75 mg by mouth 2 (two) times daily.   Yes [provider]  vitamin B-12 (CYANOCOBALAMIN) 500 MCG  tablet Take 500 mcg by mouth daily with breakfast.    Yes [provider]  diazepam (VALIUM) 5 MG tablet Take 1 tablet (5 mg total) by mouth 3 (three) times daily. Patient not taking: Reported on 07/17/2017 05/19/17   Mariel Aloe, MD    Physical Exam: Vitals:   07/17/17 2324 07/17/17 2330 07/18/17 0000 07/18/17 0030  BP: (!) 97/57 110/60 119/75 99/84  Pulse: 96 97  (!) 103  Resp: 18   20  Temp:      TempSrc:      SpO2: 96% 97%  97%  Weight:      Height:        Constitutional: NAD, calm, comfortable Vitals:   07/17/17 2324 07/17/17 2330 07/18/17 0000 07/18/17 0030  BP: (!) 97/57 110/60 119/75 99/84  Pulse: 96 97  (!) 103  Resp: 18   20  Temp:      TempSrc:      SpO2: 96% 97%  97%  Weight:      Height:       Eyes: PERRL, lids and conjunctivae normal ENMT: Mucous membranes are  Very dry,  Neck: normal, supple, no masses, no thyromegaly Respiratory: upper airway transmitted sounds, likely from secretions, . No accessory muscle use.  Cardiovascular: mild tachycardia, Regular rate and rhythm, no murmurs / rubs / gallops. 2+ pitting edema symmetrically to mid leg. 2+ pedal pulses. Abdomen: no tenderness, no masses palpated. No hepatosplenomegaly. Bowel sounds positive.  Musculoskeletal: no clubbing/cyanosis. No joint deformity upper and lower extremities. Good ROM, no contractures. Normal muscle tone.  Skin: no rashes, lesions, ulcers. No induration. Neurologic:  Strength 5/5 in all 4.  Psychiatric: Normal judgment and insight. Alert and oriented x 3. Normal mood.   Labs on Admission: I have personally reviewed following labs and imaging studies  CBC:  Recent Labs Lab 07/17/17 2142  WBC 18.9*  NEUTROABS 17.6*  HGB 9.6*  HCT 30.3*  MCV 103.8*  PLT 379*   Basic Metabolic Panel:  Recent Labs Lab 07/17/17 2142  NA 139  K 3.1*  CL 109  CO2 17*  GLUCOSE 101*  BUN 12  CREATININE 1.74*  CALCIUM 8.6*   GFR: Estimated Creatinine Clearance: 30.9 mL/min  (A) (by C-G formula based on SCr of 1.74 mg/dL (H)). Liver Function Tests:  Recent Labs Lab 07/17/17 2142  AST 31  ALT 13*  ALKPHOS 170*  BILITOT 0.8  PROT 6.1*  ALBUMIN 2.8*    Recent Labs Lab 07/17/17 2144  LIPASE 22   Urine analysis:    Component Value Date/Time   COLORURINE YELLOW 07/17/2017 2144   APPEARANCEUR CLEAR 07/17/2017 2144   LABSPEC 1.009 07/17/2017 2144   PHURINE 5.0 07/17/2017 2144   GLUCOSEU NEGATIVE 07/17/2017 2144   [place] NEGATIVE 07/17/2017 2144   [place] NEGATIVE 07/17/2017 2144   [place] NEGATIVE 07/17/2017 2144   PROTEINUR NEGATIVE 07/17/2017 2144   UROBILINOGEN 0.2 05/25/2014 1855   NITRITE NEGATIVE 07/17/2017 2144   LEUKOCYTESUR NEGATIVE 07/17/2017  2144    Radiological Exams on Admission: Dg Chest Port 1 View  Result Date: 07/17/2017 CLINICAL DATA:  Weakness.  Cough.  Shortness of breath.  Smoker. EXAM: PORTABLE CHEST 1 VIEW COMPARISON:  Chest CT dated 05/14/2017. Chest radiographs dated 05/13/2017. FINDINGS: Borderline enlarged cardiac silhouette. Clear lungs with normal vascularity. Old, healed right eighth rib fracture. Diffuse osteopenia. IMPRESSION: No acute abnormality. Electronically Signed   By: Claudie Revering M.D.   On: 07/17/2017 22:08   Ct Renal Stone Study  Result Date: 07/17/2017 CLINICAL DATA:  Hypotension abdominal pain EXAM: CT ABDOMEN AND PELVIS WITHOUT CONTRAST TECHNIQUE: Multidetector CT imaging of the abdomen and pelvis was performed following the standard protocol without IV contrast. COMPARISON:  05/18/2017, 05/14/2017 FINDINGS: Lower chest: Lung bases demonstrate a calcified subpleural left lower lobe lung nodule unchanged. No acute consolidation or pleural effusion. Coronary calcification. Hepatobiliary: No focal hepatic abnormality. Dilated gallbladder without acute inflammation or calcified stone. No biliary dilatation Pancreas: Suggestion of mild soft tissue stranding and edema near head of pancreas. No ductal  dilatation Spleen: Normal in size without focal abnormality. Adrenals/Urinary Tract: Adrenal glands are unremarkable. Kidneys are normal, without renal calculi, focal lesion, or hydronephrosis. Bladder is unremarkable. Stomach/Bowel: Small hiatal hernia. No dilated small bowel. Indistinct appearance of duodenum with mild soft tissue stranding in the region. Large amount of stool within the colon. Non identified appendix. Mild formed feces in the rectosigmoid colon. Edema/vascular congestion within the pericolonic fat of the rectosigmoid colon with suggestion of mild wall thickening of a segment of sigmoid colon, similar distribution compared to prior CT. Vascular/Lymphatic: Moderate atherosclerosis. No aneurysmal dilatation. Similar small lymph node at the GE junction. No pelvic adenopathy. Reproductive: Status post hysterectomy. No adnexal masses. Other: No free air or significant free fluid. Small fat in the umbilicus. Musculoskeletal: Degenerative changes. Chronic compression fracture at T8. IMPRESSION: 1. Mild edema/soft tissue stranding at the proximal duodenum/near head of pancreas. Findings could relate to duodenitis and or pancreatitis. Suggest correlation with enzymes. 2. Mild wall thickening of the sigmoid colon with hazy edema/inflammation in the pericolonic fat surrounding the rectosigmoid colon. Findings could be secondary to a mild colitis of infectious or inflammatory etiology, ischemic colitis may also be considered given history of hypotension. Relatively large volume of stool in the colon and rectum. 3. Negative for hydronephrosis, intrarenal stone or ureteral stone. 4. Chronic compression fracture at T8 Electronically Signed   By: Donavan Foil M.D.   On: 07/17/2017 23:03   EKG: sinus tachycardia, with PACs.  Assessment/Plan Principal Problem:   AKI (acute kidney injury) ([place]) Active Problems:   Abdominal pain   Colitis  AKI- creatinine elevated at 1.7. Likely from fluid loss from  diarrhea. 2L bolus was given in ED. - hydrate IVF N/s+ 40 Kcl 100cc/hr - Bmp Am  Colitis- diarrhea, vomiting, abdominal pain, hypotension. Leukocytosis- 18, mild lactic acidosis- 2.1- resolved. Patient meets sepsis criteria. CT scan findings of possible duodenitis or pancreatitis, and mild colitis- infectious, inflammatory versus ischemic. With rapid clearance of lactic acidosis and leukocytosis, with nonbloody diarrhea, most likely infectious. Doubt Pancreatitis with normal lipase. UA clean - stool for C. Difficile - Enteric. Precautions - continue IV Zosyn for colitis started in ED - Start PO vancomycin pending stool C. Difficile, considering leukocytosis and AKI - Discontinue IV vancomycin- as focus of infection likely from abdomen - hydrate - follow-up blood cultures drawn in ED - Zofran - bowel rest, NPO  COPD- Stable. Chest x-ray without acute abnormality - continue home bronchodilators  and steroid inhalers  Hypokalemia- Potassium 3.1. Likely from diarrhea. - Check magnesium - Replete K  Elevated ALP- chronic, now 170. RUQ ultrasound 04/2017- 6 mm stone in gallbladder, wall thickening. With subsequent HIDA scan- normal. Renal CT stone study this admission- dilated gallbladder without acute inflammation calcified stone, no biliary dilatation.   DVT prophylaxis: heparin Code Status: Full Family Communication: None at bedside Disposition Plan: Subjective determined Consults called: none Admission status: inpatient telemetry   Bethena Roys MD [facility] Pager [phone]  If 2AM-7AM, please contact night-coverage [email or web address] Password [email or web address]  07/18/2017, 12:52 AM

## 2017-07-18 NOTE — Progress Notes (Signed)
  Echocardiogram 2D Echocardiogram has been performed.  Leta JunglingCooper, Aly Hauser M 07/18/2017, 10:36 AM

## 2017-07-18 NOTE — Progress Notes (Addendum)
[facility] Pt awake and responding to questions. CN notified. Transfer order carried out.

## 2017-07-18 NOTE — Progress Notes (Signed)
Report given to RN stepdown Bjorn Loserhonda.

## 2017-07-18 NOTE — Progress Notes (Signed)
Pt transferred to stepdown. Called daughter and updated with transfer. Dtr asking about straw in rectum. Dtr states that pt has been in restraints in past with similar behavior.

## 2017-07-18 NOTE — Progress Notes (Addendum)
Patient ID: Danielle [name], female   DOB: 03-28-1946, 71 y.o.   MRN: [id number]    PROGRESS NOTE  Danielle [name]  OZH:[id number] DOB: [birth year] DOA: 07/17/2017  PCP: Patient, No Pcp Per   Brief Narrative:  Pt is 71 yo female with known hx of CVA, intra cranial hemorrhage, resides in SNF, presented to [facility] ED for evaluation of several days duration of epigastric abd pain, weakness and poor oral intake, watery diarrhea and non bloody vomiting. BP at the facility was 70/52 and she was sent to ED.  In ED, pt was tachycardic with HR up to 110's, BP 97/54, K 3.1, Cr 1.7, lipase 22, WBC 18.9. CT renal stone study notable for possible duodenitis vs pancreatitis, CXR unremarkable. Pt started on IV Vanc, Zosyn, empiric oral vanc for presumptive C. Diff and also started on IVF, admitted to telemetry for further evaluation.  Morning 10/27, pt became more agitated, restless, increased work of breathing. Blood work notable for worsening WBC up from 18 --> 25 K. Had one episode of blood bowel movement. Requiring transfer to SDU and consultation by PCCM team.   Addendum: spoke with daughter over the phone, says that mom get restless and agitated at times especially when sick, also daughter says mom is smoker and needs nicotine patch.   Assessment & Plan:   Principal Problem:   Sepsis, septic shock - unclear etiology, C. Diff negative - possible duodenitis, not really convincing for pancreatitis given stable lipase - pt has been on Zosyn and it is unclear why WBC up  - she has gotten dose of solumedrol in ED so that could be potential cause of WBC rise - there was a question of pt was on steroids but it was not on medical list - UA unremarkable, CT chest with no evidence of an infectious etiology - agree with transfer to SDU, will need IVF for pressure support - will consult with PCCM as well - will keep on Zosyn for now, not sure we need oral vanc as C. Diff is negative but could place on IV vanc for  sepsis coverage  - repeat lactic acid and procalcitonin level     Bloody stools - noted this AM, RN also reported pulling straw from rectal area  - CBC this AM indicated Hg 9.7 which appears to be pt's baseline - repeat CBC now     AKI (acute kidney injury) (HCC) - suspect pre renal etiology - pt has been on IVF but more LE swelling - Cr is trending down overall - repeat BMP in AM  Active Problems:   Abdominal pain - ? Duodenitis - allow analgesia as needed     COPD (chronic obstructive pulmonary disease) (HCC) - allow BD's as needed     Hypokalemia - supplement and repeat BMP in AM    Thrombocytosis  - reactive - monitor     Obesity  - Body mass index is 32.57 kg/m.   DVT prophylaxis: stop Heparin due to bloody stools, SCD's Code Status: Full  Family Communication: no family at bedside, attempted to call daughter over the phone, left voicemail Disposition Plan: transfer to SDU   Consultants:   PCCM  Procedures:   None  Antimicrobials:   Zosyn 10/26 -->  Vancomycin 10/26 -->  Oral Vancomycin 10/26 --> 10/27   Subjective: Pt more agitated and restless.   Objective: Vitals:   07/18/17 0100 07/18/17 0130 07/18/17 0219 07/18/17 1002  BP: (!) 110/56 113/70 119/63   Pulse: (!) 101 Marland Kitchen)  107 100   Resp: (!) 22  (!) 32   Temp:   (!) 97.4 F (36.3 C)   TempSrc:   Oral   SpO2: 95% 97% 98% 98%  Weight:   78.2 kg (172 lb 6.4 oz)   Height:   5\' 1"  (1.549 m)     Intake/Output Summary (Last 24 hours) at 07/18/17 1322 Last data filed at 07/18/17 0700  Gross per 24 hour  Intake          2648.33 ml  Output              350 ml  Net          2298.33 ml   Filed Weights   07/17/17 2204 07/18/17 0219  Weight: 74.8 kg (165 lb) 78.2 kg (172 lb 6.4 oz)    Examination:  General exam: Appears restless, now answering questions appropriately  Respiratory system: Increased work of breathing, no crackles  Cardiovascular system: Tachycardic, +1-2 bilateral LE  edema  Gastrointestinal system: Abdomen is somewhat distended but soft, mildly tender in lower and epigastric area  Central nervous system: Awake but confused, follows some commands, alert to self   Data Reviewed: I have personally reviewed following labs and imaging studies  CBC:  Recent Labs Lab 07/17/17 2142 07/18/17 0457  WBC 18.9* 25.5*  NEUTROABS 17.6*  --   HGB 9.6* 9.7*  HCT 30.3* 30.0*  MCV 103.8* 104.2*  PLT 603* 483*   Basic Metabolic Panel:  Recent Labs Lab 07/17/17 2142 07/17/17 2144 07/18/17 0457  NA 139  --  140  K 3.1*  --  3.3*  CL 109  --  111  CO2 17*  --  15*  GLUCOSE 101*  --  144*  BUN 12  --  11  CREATININE 1.74*  --  1.47*  CALCIUM 8.6*  --  7.7*  MG  --  2.0  --    Liver Function Tests:  Recent Labs Lab 07/17/17 2142  AST 31  ALT 13*  ALKPHOS 170*  BILITOT 0.8  PROT 6.1*  ALBUMIN 2.8*    Recent Labs Lab 07/17/17 2144  LIPASE 22   Urine analysis:    Component Value Date/Time   COLORURINE YELLOW 07/17/2017 2144   APPEARANCEUR CLEAR 07/17/2017 2144   LABSPEC 1.009 07/17/2017 2144   PHURINE 5.0 07/17/2017 2144   GLUCOSEU NEGATIVE 07/17/2017 2144   HGBUR NEGATIVE 07/17/2017 2144   BILIRUBINUR NEGATIVE 07/17/2017 2144   KETONESUR NEGATIVE 07/17/2017 2144   PROTEINUR NEGATIVE 07/17/2017 2144   UROBILINOGEN 0.2 05/25/2014 1855   NITRITE NEGATIVE 07/17/2017 2144   LEUKOCYTESUR NEGATIVE 07/17/2017 2144   Recent Results (from the past 240 hour(s))  C difficile quick scan w PCR reflex     Status: None   Collection Time: 07/18/17 12:52 AM  Result Value Ref Range Status   C Diff antigen NEGATIVE NEGATIVE Final   C Diff toxin NEGATIVE NEGATIVE Final   C Diff interpretation No C. difficile detected.  Final  MRSA PCR Screening     Status: None   Collection Time: 07/18/17  6:34 AM  Result Value Ref Range Status   MRSA by PCR NEGATIVE NEGATIVE Final    Comment:        The GeneXpert MRSA Assay (FDA approved for NASAL  specimens only), is one component of a comprehensive MRSA colonization surveillance program. It is not intended to diagnose MRSA infection nor to guide or monitor treatment for MRSA infections.     Radiology  Studies: Ct Chest Wo Contrast  Result Date: 07/18/2017 CLINICAL DATA:  Leukocytosis. Weakness and abdominal symptoms. No reported fever or cough. Sepsis workup. EXAM: CT CHEST WITHOUT CONTRAST TECHNIQUE: Multidetector CT imaging of the chest was performed following the standard protocol without IV contrast. COMPARISON:  Portable chest 07/17/2017.  CTs 05/14/2017. FINDINGS: Cardiovascular: Again demonstrated is extensive atherosclerosis of the aorta, great vessels and coronary arteries. No acute vascular findings are seen on noncontrast imaging. The heart size is normal. There is no pericardial effusion. Mediastinum/Nodes: There are no enlarged mediastinal, hilar or axillary lymph nodes.Hilar assessment is limited by the lack of intravenous contrast, although the hilar contours appear unchanged. The thyroid gland, trachea and esophagus demonstrate no significant findings. There is a small fat containing hiatal hernia. Lungs/Pleura: There is no pleural effusion. Stable mild atelectasis or scarring in the lingula and right upper lobe. There are calcified left lower lobe granulomas. No suspicious pulmonary nodule or confluent airspace opacity. Upper abdomen: Hepatic low density consistent with steatosis. Aortic and branch vessel atherosclerosis. No acute findings. Musculoskeletal/Chest wall: Multiple thoracic compression deformities are again noted, unchanged from previous CT. These involve the T4, T5, T6 and T9 vertebral bodies. No acute osseous findings or chest wall masses identified. IMPRESSION: 1. No acute findings or explanation for the patient's symptoms. Stable appearance of the lungs with mild atelectasis or scarring. No infiltrate. 2. Extensive atherosclerosis, including Aortic  Atherosclerosis (ICD10-I70.0). 3. Stable thoracic compression deformities. Electronically Signed   By: Carey Bullocks M.D.   On: 07/18/2017 10:39   Dg Chest Port 1 View  Result Date: 07/17/2017 CLINICAL DATA:  Weakness.  Cough.  Shortness of breath.  Smoker. EXAM: PORTABLE CHEST 1 VIEW COMPARISON:  Chest CT dated 05/14/2017. Chest radiographs dated 05/13/2017. FINDINGS: Borderline enlarged cardiac silhouette. Clear lungs with normal vascularity. Old, healed right eighth rib fracture. Diffuse osteopenia. IMPRESSION: No acute abnormality. Electronically Signed   By: Beckie Salts M.D.   On: 07/17/2017 22:08   Ct Renal Stone Study  Result Date: 07/17/2017 CLINICAL DATA:  Hypotension abdominal pain EXAM: CT ABDOMEN AND PELVIS WITHOUT CONTRAST TECHNIQUE: Multidetector CT imaging of the abdomen and pelvis was performed following the standard protocol without IV contrast. COMPARISON:  05/18/2017, 05/14/2017 FINDINGS: Lower chest: Lung bases demonstrate a calcified subpleural left lower lobe lung nodule unchanged. No acute consolidation or pleural effusion. Coronary calcification. Hepatobiliary: No focal hepatic abnormality. Dilated gallbladder without acute inflammation or calcified stone. No biliary dilatation Pancreas: Suggestion of mild soft tissue stranding and edema near head of pancreas. No ductal dilatation Spleen: Normal in size without focal abnormality. Adrenals/Urinary Tract: Adrenal glands are unremarkable. Kidneys are normal, without renal calculi, focal lesion, or hydronephrosis. Bladder is unremarkable. Stomach/Bowel: Small hiatal hernia. No dilated small bowel. Indistinct appearance of duodenum with mild soft tissue stranding in the region. Large amount of stool within the colon. Non identified appendix. Mild formed feces in the rectosigmoid colon. Edema/vascular congestion within the pericolonic fat of the rectosigmoid colon with suggestion of mild wall thickening of a segment of sigmoid colon,  similar distribution compared to prior CT. Vascular/Lymphatic: Moderate atherosclerosis. No aneurysmal dilatation. Similar small lymph node at the GE junction. No pelvic adenopathy. Reproductive: Status post hysterectomy. No adnexal masses. Other: No free air or significant free fluid. Small fat in the umbilicus. Musculoskeletal: Degenerative changes. Chronic compression fracture at T8. IMPRESSION: 1. Mild edema/soft tissue stranding at the proximal duodenum/near head of pancreas. Findings could relate to duodenitis and or pancreatitis. Suggest correlation with enzymes. 2. Mild  wall thickening of the sigmoid colon with hazy edema/inflammation in the pericolonic fat surrounding the rectosigmoid colon. Findings could be secondary to a mild colitis of infectious or inflammatory etiology, ischemic colitis may also be considered given history of hypotension. Relatively large volume of stool in the colon and rectum. 3. Negative for hydronephrosis, intrarenal stone or ureteral stone. 4. Chronic compression fracture at T8 Electronically Signed   By: Jasmine PangKim  Fujinaga M.D.   On: 07/17/2017 23:03   Scheduled Meds: . heparin  5,000 Units Subcutaneous Q8H  . mometasone-formoterol  2 puff Inhalation BID  . vancomycin  125 mg Oral Q6H  . venlafaxine  75 mg Oral BID   Continuous Infusions: . piperacillin-tazobactam (ZOSYN)  IV Stopped (07/18/17 1049)    LOS: 0 days   Time spent: 35 minutes   Debbora PrestoIskra Magick-Jayme Mednick, MD [facility] Pager [phone]  If 7PM-7AM, please contact night-coverage [email or web address] Password [email or web address] 07/18/2017, 1:22 PM

## 2017-07-18 NOTE — Plan of Care (Signed)
Problem: Safety: Goal: Ability to remain free from injury will improve Outcome: Not Progressing NEEDS REINFORCEMENT

## 2017-07-18 NOTE — Progress Notes (Signed)
Pharmacy Antibiotic Note  Danielle [name] is a 71 y.o. female admitted on 07/17/2017 with intra-abdominal infection.  Presented to ED with c/o abdominal pain and weakness.  In the ED patient received Vancomycin 1gm and Zosyn 3.375gm IV x 1 dose each for sepsis.  CT shows possible duodenitis or pancreatitis and mild colitis.  Pharmacy has been consulted for Zosyn dosing.  Plan:  IV Vancomycin not continued upon admission  Vancomycin po started pending C. Diff  Zosyn 3.375gm IV q8h (each dose infused over 4 hrs)  Follow cultures/sensitivities  Follow renal function  Height: 5\' 5"  (165.1 cm) Weight: 165 lb (74.8 kg) IBW/kg (Calculated) : 57  Temp (24hrs), Avg:97.7 F (36.5 C), Min:97.7 F (36.5 C), Max:97.7 F (36.5 C)   Recent Labs Lab 07/17/17 2142 07/17/17 2153 07/17/17 2352  WBC 18.9*  --   --   CREATININE 1.74*  --   --   LATICACIDVEN  --  2.13* 0.92    Estimated Creatinine Clearance: 30.9 mL/min (A) (by C-G formula based on SCr of 1.74 mg/dL (H)).    No Known Allergies  Antimicrobials this admission: 10/26 Vanc x 1 dose  10/26 Zosyn >>   10/27 Vanc (po) >>  Dose adjustments this admission:    Microbiology results: 10/26 BCx: sent 10/26 CDiff: sent    Thank you for allowing pharmacy to be a part of this patient's care.  Danielle [name], Danielle [name], PharmD 07/18/2017 2:21 AM

## 2017-07-19 DIAGNOSIS — J9601 Acute respiratory failure with hypoxia: Secondary | ICD-10-CM

## 2017-07-19 LAB — ABO/RH: ABO/RH(D): A POS

## 2017-07-19 LAB — GASTROINTESTINAL PANEL BY PCR, STOOL (REPLACES STOOL CULTURE)
Adenovirus F40/41: NOT DETECTED
Astrovirus: NOT DETECTED
CRYPTOSPORIDIUM: NOT DETECTED
CYCLOSPORA CAYETANENSIS: NOT DETECTED
Campylobacter species: NOT DETECTED
ENTAMOEBA HISTOLYTICA: NOT DETECTED
ENTEROAGGREGATIVE E COLI (EAEC): NOT DETECTED
Enteropathogenic E coli (EPEC): NOT DETECTED
Enterotoxigenic E coli (ETEC): NOT DETECTED
GIARDIA LAMBLIA: NOT DETECTED
Norovirus GI/GII: NOT DETECTED
Plesimonas shigelloides: NOT DETECTED
Rotavirus A: NOT DETECTED
SALMONELLA SPECIES: NOT DETECTED
SAPOVIRUS (I, II, IV, AND V): NOT DETECTED
SHIGELLA/ENTEROINVASIVE E COLI (EIEC): NOT DETECTED
Shiga like toxin producing E coli (STEC): NOT DETECTED
VIBRIO CHOLERAE: NOT DETECTED
Vibrio species: NOT DETECTED
YERSINIA ENTEROCOLITICA: NOT DETECTED

## 2017-07-19 LAB — CBC
HCT: 27.9 % — ABNORMAL LOW (ref 36.0–46.0)
HCT: 26.3 % — ABNORMAL LOW (ref 36.0–46.0)
HEMATOCRIT: 26.7 % — AB (ref 36.0–46.0)
HEMOGLOBIN: 8.4 g/dL — AB (ref 12.0–15.0)
Hemoglobin: 8.7 g/dL — ABNORMAL LOW (ref 12.0–15.0)
Hemoglobin: 8.6 g/dL — ABNORMAL LOW (ref 12.0–15.0)
MCH: 33.5 pg (ref 26.0–34.0)
MCH: 32.1 pg (ref 26.0–34.0)
MCH: 33.2 pg (ref 26.0–34.0)
MCHC: 31.2 g/dL (ref 30.0–36.0)
MCHC: 31.9 g/dL (ref 30.0–36.0)
MCHC: 32.2 g/dL (ref 30.0–36.0)
MCV: 103 fL — ABNORMAL HIGH (ref 78.0–100.0)
MCV: 104 fL — ABNORMAL HIGH (ref 78.0–100.0)
MCV: 103.9 fL — ABNORMAL HIGH (ref 78.0–100.0)
PLATELETS: 446 10*3/uL — AB (ref 150–400)
Platelets: 487 10*3/uL — ABNORMAL HIGH (ref 150–400)
Platelets: 461 10*3/uL — ABNORMAL HIGH (ref 150–400)
RBC: 2.71 MIL/uL — ABNORMAL LOW (ref 3.87–5.11)
RBC: 2.57 MIL/uL — ABNORMAL LOW (ref 3.87–5.11)
RBC: 2.53 MIL/uL — AB (ref 3.87–5.11)
RDW: 15.5 % (ref 11.5–15.5)
RDW: 15.7 % — ABNORMAL HIGH (ref 11.5–15.5)
RDW: 15.8 % — AB (ref 11.5–15.5)
WBC: 28.4 10*3/uL — ABNORMAL HIGH (ref 4.0–10.5)
WBC: 30.5 10*3/uL — ABNORMAL HIGH (ref 4.0–10.5)
WBC: 31.6 10*3/uL — AB (ref 4.0–10.5)

## 2017-07-19 LAB — RENAL FUNCTION PANEL
ALBUMIN: 2.6 g/dL — AB (ref 3.5–5.0)
Anion gap: 16 — ABNORMAL HIGH (ref 5–15)
BUN: 16 mg/dL (ref 6–20)
CHLORIDE: 120 mmol/L — AB (ref 101–111)
CO2: 14 mmol/L — ABNORMAL LOW (ref 22–32)
CREATININE: 1.51 mg/dL — AB (ref 0.44–1.00)
Calcium: 8.2 mg/dL — ABNORMAL LOW (ref 8.9–10.3)
GFR calc Af Amer: 40 mL/min — ABNORMAL LOW (ref >60)
GFR, EST NON AFRICAN AMERICAN: 34 mL/min — AB (ref >60)
Glucose, Bld: 78 mg/dL (ref 65–99)
Phosphorus: 3.7 mg/dL (ref 2.5–4.6)
Potassium: 4 mmol/L (ref 3.5–5.1)
Sodium: 150 mmol/L — ABNORMAL HIGH (ref 135–145)

## 2017-07-19 LAB — MAGNESIUM: MAGNESIUM: 1.8 mg/dL (ref 1.7–2.4)

## 2017-07-19 LAB — PROCALCITONIN: PROCALCITONIN: 17.15 ng/mL

## 2017-07-19 MED ORDER — DEXTROSE 5 % IV SOLN
INTRAVENOUS | Status: DC
  Administered 2017-07-19 – 2017-07-23 (×5): via INTRAVENOUS

## 2017-07-19 MED ORDER — LEVALBUTEROL HCL 1.25 MG/0.5ML IN NEBU
1.2500 mg | INHALATION_SOLUTION | Freq: Four times a day (QID) | RESPIRATORY_TRACT | Status: DC | PRN
Start: 2017-07-19 — End: 2017-07-27
  Administered 2017-07-23 (×3): 1.25 mg via RESPIRATORY_TRACT
  Filled 2017-07-19 (×3): qty 0.5

## 2017-07-19 NOTE — Progress Notes (Signed)
[place] [place] Attending Consult  Physician Requesting Consult:  Mart Piggs, M.D. / [facility]  Date of Admission:  07/17/2017  Date of Consult:  07/18/2017  Reason for Consult/Chief Complaint:  Sepsis   History of Presenting Illness:  History obtained from the patient's rounding hospitalist as well as the electronic medical record given her altered mental status. 71 y.o. female with known history of prior CVA with intracranial hemorrhage who currently resides at a skilled nursing facility. Presented with several days' duration epigastric abdominal pain with weakness and poor oral intake. Patient has been experiencing watery diarrhea as well as nonbloody emesis. At her facility she was noted to be hypotensive and was subsequently sent to the emergency department.In the emergency department she was noted to have a leukocytosis and elevated serum creatinine and tachycardia. CT imaging was notable for possible duodenitis versus pancreatitis. Patient was subsequently started on IV vancomycin and Zosyn as well as empiric oral vancomycin for presumed C. Difficile colitis. Patient was noted to be more agitated and restless with increased work of breathing today prompting transfer to the stepdown unit. Per documentation from the patient's daughter she does get agitated and restless at times when she is ill. She also reported that her mother is still smoking cigarettes and would likely benefit from a nicotine patch. The patient is borderline hypotensive but notably did receive lasix 11m this morning at 11am as well as Oxycodone IR. Patient is endorsing back pain, abdominal pain, and leg pain.   Subjective:  No acute events overnight. Patient with increased work of breathing today. Remains altered and unable to provide further information. Altered mentation seems to be slightly worse today.  Review of Systems: Unable to obtain given baseline dementia/encephalopathy.  Temp:  [98.4 F (36.9  C)-100.5 F (38.1 C)] 99 F (37.2 C) (10/28 0800) Pulse Rate:  [27-124] 33 (10/28 0200) Resp:  [25-41] 32 (10/28 0900) BP: (80-111)/(14-56) 106/56 (10/28 0900) SpO2:  [32 %-99 %] 78 % (10/28 0200) Weight:  [171 lb 4.8 oz (77.7 kg)] 171 lb 4.8 oz (77.7 kg) (10/27 1500)  Gen.: Elderly female. No acute distress. No family at bedside. Integument: Warm. Dry. No rash. HEENT: Dry meat is membranes. No scleral icterus. Neurological: Opens eyes to voice. Attempts to answer questions but very tachypneic. Not following commands. Pulmonary: Mild bilateral crackles. Diminished breath sounds at the bases. Moderately increased work of breathing on nasal cannula. Cardiovascular: Regular rhythm. No JVD or edema appreciated. Abdomen: Soft. Protuberant. Grossly nontender.  LINES/TUBES: Foley >>> PIV  CBC Latest Ref Rng & Units 07/19/2017 07/19/2017 07/18/2017  WBC 4.0 - 10.5 K/uL 30.5(H) 28.4(H) 23.7(H)  Hemoglobin 12.0 - 15.0 g/dL 8.4(L) 8.7(L) 8.3(L)  Hematocrit 36.0 - 46.0 % 26.3(L) 27.9(L) 25.5(L)  Platelets 150 - 400 K/uL 446(H) 487(H) 517(H)   BMP Latest Ref Rng & Units 07/19/2017 07/18/2017 07/18/2017  Glucose 65 - 99 mg/dL 78 102(H) 144(H)  BUN 6 - 20 mg/dL 16 13 11   Creatinine 0.44 - 1.00 mg/dL 1.51(H) 1.43(H) 1.47(H)  Sodium 135 - 145 mmol/L 150(H) 139 140  Potassium 3.5 - 5.1 mmol/L 4.0 3.7 3.3(L)  Chloride 101 - 111 mmol/L 120(H) 112(H) 111  CO2 22 - 32 mmol/L 14(L) 17(L) 15(L)  Calcium 8.9 - 10.3 mg/dL 8.2(L) 7.6(L) 7.7(L)   Hepatic Function Latest Ref Rng & Units 07/19/2017 07/17/2017 05/15/2017  Total Protein 6.5 - 8.1 g/dL - 6.1(L) 5.9(L)  Albumin 3.5 - 5.0 g/dL 2.6(L) 2.8(L) 2.6(L)  AST 15 - 41 U/L - 31  24  ALT 14 - 54 U/L - 13(L) 25  Alk Phosphatase 38 - 126 U/L - 170(H) 137(H)  Total Bilirubin 0.3 - 1.2 mg/dL - 0.8 0.4  Bilirubin, Direct 0.1 - 0.5 mg/dL - - 0.1    IMAGING/STUDIES: CT RENAL STONE 10/26: IMPRESSION: 1. Mild edema/soft tissue stranding at the proximal  duodenum/near head of pancreas. Findings could relate to duodenitis and or pancreatitis. Suggest correlation with enzymes. 2. Mild wall thickening of the sigmoid colon with hazy edema/inflammation in the pericolonic fat surrounding the rectosigmoid colon. Findings could be secondary to a mild colitis of infectious or inflammatory etiology, ischemic colitis may also be considered given history of hypotension. Relatively large volume of stool in the colon and rectum. 3. Negative for hydronephrosis, intrarenal stone or ureteral stone. 4. Chronic compression fracture at T8 TTE 10/27:  Moderate LVH with normal cavity size. EF 65-70% with normal regional wall motion & grade 1 diastolic dysfunction. LA & RA normal in size. RV normal in size and function. No aortic stenosis or regurgitation. Aortic root normal in size. No mitral stenosis or regurgitation. No pulmonic stenosis. No tricuspid regurgitation. No pericardial effusion. CT CHEST W/O 10/27:  Previously reviewed by me. Compression of thoracic vertebral bodies noted. No pleural effusion. No pericardial effusion. No pleural thickening. No pathologic mediastinal adenopathy. No parenchymal mass or consolidation.calcified nodules are present in left lower lobe.  MICROBIOLOGY: U/A 10/26:  Nitrite Negative/ Leukocytes Negative MRSA PCR 10/27:  Negative Stool C diff 10/27:  Negative Gastrointestinal Panel PCR 10/27 >>> Blood Cultures x2 10/26 >>>  ANTIBIOTICS: Vancomycin 10/27 >>> Zosyn 10/27 >>>  SIGNIFICANT EVENTS: 10/26 - Admit from SNF w/ hypotension & clinical picture of SIRS vs Sepsis 10/27 - Transfer to SDU/ICU w/ worsening mental status, tachypnea, &  Possible GIB  ASSESSMENT/PLAN:  71 y.o. female presenting with abdominal pain and acute renal failure. Patient has probable sepsis and certainly has SIRS likely due to the probable duodenitis/GI findings on CT imaging above. Blood pressure remained stable. Renal function remains marginal at this  time. Despite melena patient's anemia has stabilized. Her rising leukocytosis is certainly concerning but she exhibits no clinical worsening at this time.   1. SIRS/Probable Sepsis: Recommend continuing to trend Procalcitonin per algorithm. Recommend continuing empiric vancomycin and Zosyn while awaiting culture results and gastrointestinal PCR panel result. 2. Acute hypoxic respiratory failure: Recommend continuing supplemental oxygen to maintain saturation greater than 92%.continuous pulse oximetry ordered. Initiating BiPAP for increased work of breathing. 3. Probable duodenitis: Gastrointestinal PCR pending. 4. Acute renal failure: Maintaining mean arterial pressure greater than 65. Continuing to monitor urine output with Foley catheter. Recommend continuing to avoid nephrotoxic agents. 5. Acute encephalopathy with baseline dementia: Likely some element of toxic metabolic encephalopathy. Continuing home Valium at a decreased dose as well as Effexor. 6. History of COPD/asthma: No signs of acute exacerbation. Continuing Dulera twice a day in place of Symbicort. 7. Tobacco use disorder: Continuing nicotine patch. 8. Lactic acidosis: Likely secondary sepsis. Resolved. 9. Hyponatremia: Mild. Recommend continuing to trend electrolytes.   I have spent a total of 36 minutes of time today caring for the patient, reviewing the patient's electronic medical record, and with more than 50% of that time spent coordinating care with the patient as well as reviewing the continuing plan of care with the patient's nurse at bedside.  Remainder of care as per primary service and other consultants.  Sonia Baller Ashok Cordia, M.D. [facility] Pager:  [phone] After 7pm or if  no response, call [phone] 9:57 AM 07/19/17

## 2017-07-19 NOTE — Progress Notes (Addendum)
Patient ID: Danielle [name], female   DOB: 08-29-1946, 71 y.o.   MRN: [id number]    PROGRESS NOTE  Gurtha Picker  WUJ:[id number] DOB: [birth year] DOA: 07/17/2017  PCP: Patient, No Pcp Per   Brief Narrative:  Pt is 71 yo female with known hx of CVA, intra cranial hemorrhage, resides in SNF, presented to [facility] ED for evaluation of several days duration of epigastric abd pain, weakness and poor oral intake, watery diarrhea and non bloody vomiting. BP at the facility was 70/52 and she was sent to ED.  In ED, pt was tachycardic with HR up to 110's, BP 97/54, K 3.1, Cr 1.7, lipase 22, WBC 18.9. CT renal stone study notable for possible duodenitis vs pancreatitis, CXR unremarkable. Pt started on IV Vanc, Zosyn, empiric oral vanc for presumptive C. Diff and also started on IVF, admitted to telemetry for further evaluation.  Major events since admission: Morning 10/27 - pt became more agitated, restless, increased work of breathing. Blood work notable for worsening WBC up from 18 --> 25 K. Had one episode of blood bowel movement. Requiring transfer to SDU and consultation by PCCM team.  10/28 - still rather confused, remains in SDU   Assessment & Plan:   Principal Problem:   Sepsis, septic shock - unclear etiology, C. Diff negative - possible duodenitis, colitis, not really convincing for pancreatitis given stable lipase - pt has been on Zosyn and it is unclear why WBC up  - she has gotten dose of solumedrol in ED so that could be potential cause of WBC rise - there was a question of pt was on steroids but it was not on medical list - UA unremarkable, CT chest with no evidence of an infectious etiology - ABX broadened to Vancomycin and Zosyn, will continue same regimen for now - follow pro calcitonin, lactic acid already cleared and is WNL this AM    Bloody stools - noted 10/27, RN also reported pulling straw from rectal area  - at baseline Hg ~9-10 - Hg down to 8.6 - will continue to monitor,  CBC in AM - keep on PPI for now    Tachycardia - reactive - will treat active problems - can use metoprolol as needed     AKI (acute kidney injury) (HCC) - suspect pre renal etiology - has been on IVF but Cr still up - will repeat BMP in AM - monitor urine output  Active Problems:   Abdominal pain, diarrhea  - ? Duodenitis, per CT imaging, unable to obtain clear history from pt  - allow analgesia as needed - C. Diff negative, stool GI panel negative     COPD (chronic obstructive pulmonary disease) (HCC), hx of smoking  - no signs of active exacerbation but increased work of breathing this AM - continue BD's as needed  - placed on BiPAP per PCCM  - nicotine patch     Acute metabolic and toxic encephalopathy - still somnolent, confused - keep in SDU - monitor for signs of withdrawal     Hx of depression - per daughter, pt has hx of depression and multiple other psychiatric issues of which she was not sure  - daughter explains she has bene on multiple medications  - cont Effexor, cont valium, watch for signs of withdrawal     Hypernatremia - would change IVF to D5 as Na is up this AM - will repeat BMP in AM    Hypokalemia - supplemented and WNL - will repeat BMP in  AM    Thrombocytosis  - suspect reactive - continue to monitor     Obesity  - Body mass index is 32.57 kg/m.  DVT prophylaxis: SCD's Code Status: DNR Family Communication: spoke with daughter over the phone  Disposition Plan: keep in SDU  Consultants:   PCCM  Procedures:   None  Antimicrobials:   Zosyn 10/26 -->  Vancomycin 10/26 -->  Oral Vancomycin 10/26 --> 10/27   Subjective: Remains somnolent and somewhat restless.   Objective: Vitals:   07/19/17 0400 07/19/17 0800 07/19/17 0900 07/19/17 1219  BP: (!) 80/49 (!) 111/43 (!) 106/56   Pulse:    (!) 150  Resp: (!) 33 (!) 31 (!) 32 (!) 33  Temp:  99 F (37.2 C)    TempSrc:  Axillary    SpO2:    100%  Weight:      Height:         Intake/Output Summary (Last 24 hours) at 07/19/17 1249 Last data filed at 07/19/17 0528  Gross per 24 hour  Intake          2011.24 ml  Output             2556 ml  Net          -544.76 ml   Filed Weights   07/17/17 2204 07/18/17 0219 07/18/17 1500  Weight: 74.8 kg (165 lb) 78.2 kg (172 lb 6.4 oz) 77.7 kg (171 lb 4.8 oz)   Physical Exam  Constitutional: Appears somnolent, tired, NAD CVS: tachycardic, no murmurs  Pulmonary: increased work of breathing, some crackles at bases  Abdominal: Soft. BS +,  no distension, tenderness Musculoskeletal: Bilateral LE pitting edema +! Neuro: somnolent, not answering any questions appropriately   Data Reviewed: I have personally reviewed following labs and imaging studies  CBC:  Recent Labs Lab 07/17/17 2142  07/18/17 1407 07/18/17 1628 07/19/17 0048 07/19/17 0739 07/19/17 1144  WBC 18.9*  < > 24.3* 23.7* 28.4* 30.5* 31.6*  NEUTROABS 17.6*  --   --   --   --   --   --   HGB 9.6*  < > 8.4* 8.3* 8.7* 8.4* 8.6*  HCT 30.3*  < > 25.8* 25.5* 27.9* 26.3* 26.7*  MCV 103.8*  < > 102.8* 102.4* 103.0* 104.0* 103.9*  PLT 603*  < > 525* 517* 487* 446* 461*  < > = values in this interval not displayed. Basic Metabolic Panel:  Recent Labs Lab 07/17/17 2142 07/17/17 2144 07/18/17 0457 07/18/17 1407 07/19/17 0048  NA 139  --  140 139 150*  K 3.1*  --  3.3* 3.7 4.0  CL 109  --  111 112* 120*  CO2 17*  --  15* 17* 14*  GLUCOSE 101*  --  144* 102* 78  BUN 12  --  11 13 16   CREATININE 1.74*  --  1.47* 1.43* 1.51*  CALCIUM 8.6*  --  7.7* 7.6* 8.2*  MG  --  2.0  --   --  1.8  PHOS  --   --   --   --  3.7   Liver Function Tests:  Recent Labs Lab 07/17/17 2142 07/19/17 0048  AST 31  --   ALT 13*  --   ALKPHOS 170*  --   BILITOT 0.8  --   PROT 6.1*  --   ALBUMIN 2.8* 2.6*    Recent Labs Lab 07/17/17 2144  LIPASE 22   Urine analysis:    Component Value Date/Time  COLORURINE YELLOW 07/17/2017 2144   APPEARANCEUR CLEAR  07/17/2017 2144   LABSPEC 1.009 07/17/2017 2144   PHURINE 5.0 07/17/2017 2144   GLUCOSEU NEGATIVE 07/17/2017 2144   HGBUR NEGATIVE 07/17/2017 2144   BILIRUBINUR NEGATIVE 07/17/2017 2144   KETONESUR NEGATIVE 07/17/2017 2144   PROTEINUR NEGATIVE 07/17/2017 2144   UROBILINOGEN 0.2 05/25/2014 1855   NITRITE NEGATIVE 07/17/2017 2144   LEUKOCYTESUR NEGATIVE 07/17/2017 2144   Recent Results (from the past 240 hour(s))  C difficile quick scan w PCR reflex     Status: None   Collection Time: 07/18/17 12:52 AM  Result Value Ref Range Status   C Diff antigen NEGATIVE NEGATIVE Final   C Diff toxin NEGATIVE NEGATIVE Final   C Diff interpretation No C. difficile detected.  Final  MRSA PCR Screening     Status: None   Collection Time: 07/18/17  6:34 AM  Result Value Ref Range Status   MRSA by PCR NEGATIVE NEGATIVE Final    Comment:        The GeneXpert MRSA Assay (FDA approved for NASAL specimens only), is one component of a comprehensive MRSA colonization surveillance program. It is not intended to diagnose MRSA infection nor to guide or monitor treatment for MRSA infections.   Gastrointestinal Panel by PCR , Stool     Status: None   Collection Time: 07/18/17  1:32 PM  Result Value Ref Range Status   Campylobacter species NOT DETECTED NOT DETECTED Final   Plesimonas shigelloides NOT DETECTED NOT DETECTED Final   Salmonella species NOT DETECTED NOT DETECTED Final   Yersinia enterocolitica NOT DETECTED NOT DETECTED Final   Vibrio species NOT DETECTED NOT DETECTED Final   Vibrio cholerae NOT DETECTED NOT DETECTED Final   Enteroaggregative E coli (EAEC) NOT DETECTED NOT DETECTED Final   Enteropathogenic E coli (EPEC) NOT DETECTED NOT DETECTED Final   Enterotoxigenic E coli (ETEC) NOT DETECTED NOT DETECTED Final   Shiga like toxin producing E coli (STEC) NOT DETECTED NOT DETECTED Final   Shigella/Enteroinvasive E coli (EIEC) NOT DETECTED NOT DETECTED Final   Cryptosporidium NOT DETECTED  NOT DETECTED Final   Cyclospora cayetanensis NOT DETECTED NOT DETECTED Final   Entamoeba histolytica NOT DETECTED NOT DETECTED Final   Giardia lamblia NOT DETECTED NOT DETECTED Final   Adenovirus F40/41 NOT DETECTED NOT DETECTED Final   Astrovirus NOT DETECTED NOT DETECTED Final   Norovirus GI/GII NOT DETECTED NOT DETECTED Final   Rotavirus A NOT DETECTED NOT DETECTED Final   Sapovirus (I, II, IV, and V) NOT DETECTED NOT DETECTED Final    Radiology Studies: Ct Chest Wo Contrast  Result Date: 07/18/2017 CLINICAL DATA:  Leukocytosis. Weakness and abdominal symptoms. No reported fever or cough. Sepsis workup. EXAM: CT CHEST WITHOUT CONTRAST TECHNIQUE: Multidetector CT imaging of the chest was performed following the standard protocol without IV contrast. COMPARISON:  Portable chest 07/17/2017.  CTs 05/14/2017. FINDINGS: Cardiovascular: Again demonstrated is extensive atherosclerosis of the aorta, great vessels and coronary arteries. No acute vascular findings are seen on noncontrast imaging. The heart size is normal. There is no pericardial effusion. Mediastinum/Nodes: There are no enlarged mediastinal, hilar or axillary lymph nodes.Hilar assessment is limited by the lack of intravenous contrast, although the hilar contours appear unchanged. The thyroid gland, trachea and esophagus demonstrate no significant findings. There is a small fat containing hiatal hernia. Lungs/Pleura: There is no pleural effusion. Stable mild atelectasis or scarring in the lingula and right upper lobe. There are calcified left lower lobe granulomas. No suspicious  pulmonary nodule or confluent airspace opacity. Upper abdomen: Hepatic low density consistent with steatosis. Aortic and branch vessel atherosclerosis. No acute findings. Musculoskeletal/Chest wall: Multiple thoracic compression deformities are again noted, unchanged from previous CT. These involve the T4, T5, T6 and T9 vertebral bodies. No acute osseous findings or  chest wall masses identified. IMPRESSION: 1. No acute findings or explanation for the patient's symptoms. Stable appearance of the lungs with mild atelectasis or scarring. No infiltrate. 2. Extensive atherosclerosis, including Aortic Atherosclerosis (ICD10-I70.0). 3. Stable thoracic compression deformities. Electronically Signed   By: Carey BullocksWilliam  Veazey M.D.   On: 07/18/2017 10:39   Dg Chest Port 1 View  Result Date: 07/17/2017 CLINICAL DATA:  Weakness.  Cough.  Shortness of breath.  Smoker. EXAM: PORTABLE CHEST 1 VIEW COMPARISON:  Chest CT dated 05/14/2017. Chest radiographs dated 05/13/2017. FINDINGS: Borderline enlarged cardiac silhouette. Clear lungs with normal vascularity. Old, healed right eighth rib fracture. Diffuse osteopenia. IMPRESSION: No acute abnormality. Electronically Signed   By: Beckie SaltsSteven  Reid M.D.   On: 07/17/2017 22:08   Ct Renal Stone Study  Result Date: 07/17/2017 CLINICAL DATA:  Hypotension abdominal pain EXAM: CT ABDOMEN AND PELVIS WITHOUT CONTRAST TECHNIQUE: Multidetector CT imaging of the abdomen and pelvis was performed following the standard protocol without IV contrast. COMPARISON:  05/18/2017, 05/14/2017 FINDINGS: Lower chest: Lung bases demonstrate a calcified subpleural left lower lobe lung nodule unchanged. No acute consolidation or pleural effusion. Coronary calcification. Hepatobiliary: No focal hepatic abnormality. Dilated gallbladder without acute inflammation or calcified stone. No biliary dilatation Pancreas: Suggestion of mild soft tissue stranding and edema near head of pancreas. No ductal dilatation Spleen: Normal in size without focal abnormality. Adrenals/Urinary Tract: Adrenal glands are unremarkable. Kidneys are normal, without renal calculi, focal lesion, or hydronephrosis. Bladder is unremarkable. Stomach/Bowel: Small hiatal hernia. No dilated small bowel. Indistinct appearance of duodenum with mild soft tissue stranding in the region. Large amount of stool  within the colon. Non identified appendix. Mild formed feces in the rectosigmoid colon. Edema/vascular congestion within the pericolonic fat of the rectosigmoid colon with suggestion of mild wall thickening of a segment of sigmoid colon, similar distribution compared to prior CT. Vascular/Lymphatic: Moderate atherosclerosis. No aneurysmal dilatation. Similar small lymph node at the GE junction. No pelvic adenopathy. Reproductive: Status post hysterectomy. No adnexal masses. Other: No free air or significant free fluid. Small fat in the umbilicus. Musculoskeletal: Degenerative changes. Chronic compression fracture at T8. IMPRESSION: 1. Mild edema/soft tissue stranding at the proximal duodenum/near head of pancreas. Findings could relate to duodenitis and or pancreatitis. Suggest correlation with enzymes. 2. Mild wall thickening of the sigmoid colon with hazy edema/inflammation in the pericolonic fat surrounding the rectosigmoid colon. Findings could be secondary to a mild colitis of infectious or inflammatory etiology, ischemic colitis may also be considered given history of hypotension. Relatively large volume of stool in the colon and rectum. 3. Negative for hydronephrosis, intrarenal stone or ureteral stone. 4. Chronic compression fracture at T8 Electronically Signed   By: Jasmine PangKim  Fujinaga M.D.   On: 07/17/2017 23:03   Scheduled Meds: . diazepam  2 mg Oral TID  . mometasone-formoterol  2 puff Inhalation BID  . nicotine  21 mg Transdermal Daily  . [START ON 07/22/2017] pantoprazole  40 mg Intravenous Q12H  . venlafaxine  75 mg Oral BID   Continuous Infusions: . lactated ringers 100 mL/hr at 07/18/17 2225  . pantoprozole (PROTONIX) infusion 8 mg/hr (07/19/17 0526)  . piperacillin-tazobactam (ZOSYN)  IV Stopped (07/19/17 [id number])  . vancomycin  Stopped (07/18/17 1742)    LOS: 1 day   Time spent: 35 minutes   Debbora Presto, MD [facility] Pager [phone]  If 7PM-7AM, please contact  night-coverage [email or web address] Password [email or web address] 07/19/2017, 12:49 PM

## 2017-07-20 ENCOUNTER — Inpatient Hospital Stay (HOSPITAL_COMMUNITY): Payer: Medicare Other

## 2017-07-20 DIAGNOSIS — K529 Noninfective gastroenteritis and colitis, unspecified: Secondary | ICD-10-CM

## 2017-07-20 DIAGNOSIS — J9601 Acute respiratory failure with hypoxia: Secondary | ICD-10-CM

## 2017-07-20 LAB — BASIC METABOLIC PANEL
Anion gap: 9 (ref 5–15)
BUN: 22 mg/dL — AB (ref 6–20)
CHLORIDE: 113 mmol/L — AB (ref 101–111)
CO2: 18 mmol/L — AB (ref 22–32)
Calcium: 7.6 mg/dL — ABNORMAL LOW (ref 8.9–10.3)
Creatinine, Ser: 1.26 mg/dL — ABNORMAL HIGH (ref 0.44–1.00)
GFR calc Af Amer: 49 mL/min — ABNORMAL LOW (ref >60)
GFR calc non Af Amer: 42 mL/min — ABNORMAL LOW (ref >60)
Glucose, Bld: 102 mg/dL — ABNORMAL HIGH (ref 65–99)
POTASSIUM: 3.4 mmol/L — AB (ref 3.5–5.1)
SODIUM: 140 mmol/L (ref 135–145)

## 2017-07-20 LAB — MAGNESIUM: MAGNESIUM: 1.7 mg/dL (ref 1.7–2.4)

## 2017-07-20 LAB — CBC
HEMATOCRIT: 23.9 % — AB (ref 36.0–46.0)
HEMOGLOBIN: 7.7 g/dL — AB (ref 12.0–15.0)
MCH: 33.3 pg (ref 26.0–34.0)
MCHC: 32.2 g/dL (ref 30.0–36.0)
MCV: 103.5 fL — ABNORMAL HIGH (ref 78.0–100.0)
Platelets: 393 10*3/uL (ref 150–400)
RBC: 2.31 MIL/uL — AB (ref 3.87–5.11)
RDW: 15.9 % — ABNORMAL HIGH (ref 11.5–15.5)
WBC: 28.1 10*3/uL — ABNORMAL HIGH (ref 4.0–10.5)

## 2017-07-20 LAB — PROCALCITONIN: Procalcitonin: 4.37 ng/mL

## 2017-07-20 LAB — OCCULT BLOOD X 1 CARD TO LAB, STOOL
FECAL OCCULT BLD: POSITIVE — AB
Fecal Occult Bld: NEGATIVE

## 2017-07-20 LAB — PREPARE RBC (CROSSMATCH)

## 2017-07-20 MED ORDER — MAGNESIUM SULFATE 2 GM/50ML IV SOLN
2.0000 g | Freq: Once | INTRAVENOUS | Status: DC
  Filled 2017-07-20: qty 50

## 2017-07-20 MED ORDER — SODIUM CHLORIDE 0.9 % IV SOLN
Freq: Once | INTRAVENOUS | Status: AC
  Administered 2017-07-20: 13:00:00 via INTRAVENOUS

## 2017-07-20 MED ORDER — PANTOPRAZOLE SODIUM 40 MG IV SOLR
40.0000 mg | Freq: Two times a day (BID) | INTRAVENOUS | Status: DC
  Administered 2017-07-20 – 2017-07-26 (×11): 40 mg via INTRAVENOUS
  Filled 2017-07-20 (×12): qty 40

## 2017-07-20 MED ORDER — POTASSIUM CHLORIDE 10 MEQ/100ML IV SOLN
10.0000 meq | INTRAVENOUS | Status: AC
  Administered 2017-07-20 (×4): 10 meq via INTRAVENOUS
  Filled 2017-07-20 (×2): qty 100

## 2017-07-20 NOTE — Care Management Note (Signed)
Case Management Note  Patient Details  Name: Danielle [name] MRN: [id number] Date of Birth: [birth year]  Subjective/Objective:                  resp distress and Bi-pap  Action/Plan:   Expected Discharge Date:                  Expected Discharge Plan:  Home/Self Care  In-House Referral:  Clinical Social Work  Discharge planning Services  CM Consult  Post Acute Care Choice:    Choice offered to:     DME Arranged:    DME Agency:     HH Arranged:    HH Agency:     Status of Service:  In process, will continue to follow  If discussed at Long Length of Stay Meetings, dates discussed:    Additional Comments:  Golda AcreDavis, Rhonda Lynn, RN 07/20/2017, 9:04 AM

## 2017-07-20 NOTE — Progress Notes (Signed)
[facility] Attending Consult  Physician Requesting Consult:  Danie Binder, M.D. / [facility]  Date of Admission:  07/17/2017  Date of Consult:  07/18/2017  Reason for Consult/Chief Complaint:  Sepsis   History of Presenting Illness:  History obtained from the patient's rounding hospitalist as well as the electronic medical record given her altered mental status. 71 y.o. female with known history of prior CVA with intracranial hemorrhage who currently resides at a skilled nursing facility. Presented with several days' duration epigastric abdominal pain with weakness and poor oral intake. Patient has been experiencing watery diarrhea as well as nonbloody emesis. At her facility she was noted to be hypotensive and was subsequently sent to the emergency department.In the emergency department she was noted to have a leukocytosis and elevated serum creatinine and tachycardia. CT imaging was notable for possible duodenitis versus pancreatitis. Patient was subsequently started on IV vancomycin and Zosyn as well as empiric oral vancomycin for presumed C. Difficile colitis. Patient was noted to be more agitated and restless with increased work of breathing today prompting transfer to the stepdown unit. Per documentation from the patient's daughter she does get agitated and restless at times when she is ill. She also reported that her mother is still smoking cigarettes and would likely benefit from a nicotine patch. The patient is borderline hypotensive but notably did receive lasix 40mg  this morning at 11am as well as Oxycodone IR. Patient is endorsing back pain, abdominal pain, and leg pain.   Subjective:   Appears comfortable on noninvasive positive pressure ventilation however still has significant discomfort on abdominal palpation  Temp:  [97.7 F (36.5 C)-99.1 F (37.3 C)] 98.4 F (36.9 C) (10/29 1200) Pulse Rate:  [73-142] 107 (10/29 1158) Resp:  [22-37] 27 (10/29 1158) BP:  (89-120)/(40-87) 103/51 (10/29 1158) SpO2:  [98 %-100 %] 100 % (10/29 1158) FiO2 (%):  [30 %] 30 % (10/28 2037) Weight:  [178 lb 5.6 oz (80.9 kg)] 178 lb 5.6 oz (80.9 kg) (10/28 2200)  General: This is a 71 year old female who appears much older than stated age she is currently on noninvasive positive pressure ventilation she will open her eyes to gentle stimulus and verbal request HEENT: BiPAP mask is in place there is no clear jugular venous distention she is atraumatic and normocephalic Pulmonary: Clear to auscultation without accessory muscle use current while on invasive positive pressure ventilation Cardiac tachycardia regular rate and rhythm Abdomen: Protuberant, positive bowel sounds, tender to palpation Extremities/musculoskeletal: Warm and dry brisk cap refill good pulses no significant edema  LINES/TUBES: Foley >>> PIV   CBC Recent Labs     07/19/17  0739  07/19/17  1144  07/20/17  0339  WBC  30.5*  31.6*  28.1*  HGB  8.4*  8.6*  7.7*  HCT  26.3*  26.7*  23.9*  PLT  446*  461*  393    Coag's Recent Labs     07/18/17  1627  APTT  45*  INR  1.35    BMET Recent Labs     07/18/17  1407  07/19/17  0048  07/20/17  0339  NA  139  150*  140  K  3.7  4.0  3.4*  CL  112*  120*  113*  CO2  17*  14*  18*  BUN  13  16  22*  CREATININE  1.43*  1.51*  1.26*  GLUCOSE  102*  78  102*    Electrolytes Recent Labs  07/17/17  2144   07/18/17  1407  07/19/17  0048  07/20/17  0339  CALCIUM   --    < >  7.6*  8.2*  7.6*  MG  2.0   --    --   1.8  1.7  PHOS   --    --    --   3.7   --    < > = values in this interval not displayed.    Sepsis Markers Recent Labs     07/18/17  1407  07/19/17  0048  07/20/17  0339  PROCALCITON  30.14  17.15  4.37    ABG No results for input(s): PHART, PCO2ART, PO2ART in the last 72 hours.  Liver Enzymes Recent Labs     07/17/17  2142  07/19/17  0048  AST  31   --   ALT  13*   --   ALKPHOS  170*   --   BILITOT   0.8   --   ALBUMIN  2.8*  2.6*    Cardiac Enzymes No results for input(s): TROPONINI, PROBNP in the last 72 hours.  Glucose Recent Labs     07/18/17  1342  GLUCAP  71    Imaging No results found.   IMAGING/STUDIES: CT RENAL STONE 10/26: IMPRESSION: 1. Mild edema/soft tissue stranding at the proximal duodenum/near head of pancreas. Findings could relate to duodenitis and or pancreatitis. Suggest correlation with enzymes. 2. Mild wall thickening of the sigmoid colon with hazy edema/inflammation in the pericolonic fat surrounding the rectosigmoid colon. Findings could be secondary to a mild colitis of infectious or inflammatory etiology, ischemic colitis may also be considered given history of hypotension. Relatively large volume of stool in the colon and rectum. 3. Negative for hydronephrosis, intrarenal stone or ureteral stone. 4. Chronic compression fracture at T8 TTE 10/27:  Moderate LVH with normal cavity size. EF 65-70% with normal regional wall motion & grade 1 diastolic dysfunction. LA & RA normal in size. RV normal in size and function. No aortic stenosis or regurgitation. Aortic root normal in size. No mitral stenosis or regurgitation. No pulmonic stenosis. No tricuspid regurgitation. No pericardial effusion. CT CHEST W/O 10/27:  Previously reviewed by me. Compression of thoracic vertebral bodies noted. No pleural effusion. No pericardial effusion. No pleural thickening. No pathologic mediastinal adenopathy. No parenchymal mass or consolidation.calcified nodules are present in left lower lobe.  MICROBIOLOGY: U/A 10/26:  Nitrite Negative/ Leukocytes Negative MRSA PCR 10/27:  Negative Stool C diff 10/27:  Negative Gastrointestinal Panel PCR 10/27 >>>negative  Blood Cultures x2 10/26 >>>  ANTIBIOTICS: Vancomycin 10/27 >>> Zosyn 10/27 >>>  SIGNIFICANT EVENTS: 10/26 - Admit from SNF w/ hypotension & clinical picture of SIRS vs Sepsis 10/27 - Transfer to SDU/ICU w/ worsening  mental status, tachypnea, &  Possible GIB  ASSESSMENT/PLAN:   SIRS/Probable Sepsis: Presumed due to acute duodenitis White blood cell count a little better as has PCT Plan Continue Zosyn Stop vancomycin Trend trend procalcitonin  Bloody stools, with mild anemia.  Also has abd pain. hgb has drifted down some. Primary service has  Ordered transfusion.  Plan F/u cbc Will change PPI to BID  Transfusion per primary team   Acute encephalopathy with baseline dementia: Likely some element of toxic metabolic encephalopathy. Plan Continue home valium and effexor Cont supportive care   Acute hypoxic respiratory failure, has history of COPD/asthma, still active smoker. Tachypnea  -CT chest was w/out acute findings  -? Tachypnea related to wd  or pain? Seemingly better w/ analgesia  Plan rx pain Pulse ox Change BIPAP to PRN  Repeat CXR Cont BDs   Acute renal failure, with non-anion gap metabolic acidosis in the setting of hyperchloremia: Creatinine continues to improve, as has her acid-base Plan Cont current IVFs Renal dose meds   Discussion    71 y.o. female presenting with abdominal pain and acute renal failure. Patient has probable sepsis and certainly has SIRS likely due to the probable duodenitis/GI findings on CT imaging above. Blood pressure remained stable. PCCM asked to see when pt seemed to develop worsening work of breathing on 10/27 Now on BIPAP.  Clinically more stable Seems to still have sig abd discomfort.  For today:  Cont IVFs Change BIPAP to PRN Dc vanc, cont zosyn Transfuse per primary service. Get flat plate of abd to be sure things are not worse  My critical care time 32 minutes.   Simonne Martinet ACNP-BC [facility] Pulmonary/Critical Care Pager # [phone] OR # [phone] if no answer  12:39 PM 07/20/17

## 2017-07-20 NOTE — Progress Notes (Signed)
Patient ID: Danielle [name], female   DOB: July 13, 1946, 70 y.o.   MRN: [id number]    PROGRESS NOTE  Ladine Kiper  WUJ:[id number] DOB: 1946/04/18 DOA: 07/17/2017  PCP: Patient, No Pcp Per   Brief Narrative:  Pt is 71 yo female with known hx of CVA, intra cranial hemorrhage, resides in SNF, presented to [facility] ED for evaluation of several days duration of epigastric abd pain, weakness and poor oral intake, watery diarrhea and non bloody vomiting. BP at the facility was 70/52 and she was sent to ED.  In ED, pt was tachycardic with HR up to 110's, BP 97/54, K 3.1, Cr 1.7, lipase 22, WBC 18.9. CT renal stone study notable for possible duodenitis vs pancreatitis, CXR unremarkable. Pt started on IV Vanc, Zosyn, empiric oral vanc for presumptive C. Diff and also started on IVF, admitted to telemetry for further evaluation.  Major events since admission: Morning 10/27 - pt became more agitated, restless, increased work of breathing. Blood work notable for worsening WBC up from 18 --> 25 K. Had one episode of blood bowel movement. Requiring transfer to SDU and consultation by PCCM team.  10/28 - still rather confused, remains in SDU  10/29 - still somnolent, remains in SDU on BiPAP, needs one U PRBC due to drop in Hg  Assessment & Plan:   Principal Problem:   Sepsis, septic shock - unclear etiology, C. Diff negative - possible duodenitis, colitis, not really convincing for pancreatitis given stable lipase - pt has been on Zosyn, will continue same regimen, agree with stopping Vanc  - WBC is trending down - CBC in AM    Bloody stools - noted 10/27, RN also reported pulling straw from rectal area  - at baseline Hg ~9-10 - Hg down to 7.7, transfuse one U PRBC today - keep on PPI for now - per daughter, pt reluctant to have any invasive interventions done, not sure about endoscopy, she asked to wait until mom more alert to discuss - will monitor for signs of recurrent bleed and consider GI consult  depending on clinical progress  - CBC in AM    Tachycardia - reactive - will treat active problems - ok to use metoprolol if needed     AKI (acute kidney injury) (HCC) - suspect pre renal etiology - IVF provided, Cr trending down - BMP in AM   Active Problems:   Abdominal pain, diarrhea  - ? Duodenitis, per CT imaging, unable to obtain clear history from pt  - allow analgesia as needed - C. Diff negative, stool GI panel negative  - ABX XRAY with increased air in bowel    Acute respiratory failure in pt with known hx of COPD (chronic obstructive pulmonary disease) (HCC), hx of smoking  - has required placement on BiPAP - continue BD's as needed  - nicotine patch     Acute metabolic and toxic encephalopathy - still somnolent, able to open eyes but due to somnolence not able to follow all commands - keep in SDU     Hx of depression - per daughter, pt has hx of depression and multiple other psychiatric issues of which she was not sure  - daughter explains she has bene on multiple medications  - cont Effexor, cont valium, watch for signs of withdrawal     Hypernatremia - have changed fluids to D5 and Na is now WNL - repeat BMP in AM    Hypokalemia - still low, will supplement via IV route as pt  on BiPAP - BMP in AM    Thrombocytosis  - suspect reactive - now resolved     Obesity  - Body mass index is 32.57 kg/m.  DVT prophylaxis: SCD's Code Status: DNR Family Communication: spoke with daughter over the phone, daughter asked not tell mom that she will be coming to visit Disposition Plan: keep in SDU  Consultants:   PCCM  Procedures:   None  Antimicrobials:   Zosyn 10/26 -->  Vancomycin 10/26 --> 10/29   Oral Vancomycin 10/26 --> 10/27   Subjective: On BIPAP, somnolent.   Objective: Vitals:   07/20/17 1305 07/20/17 1320 07/20/17 1515 07/20/17 1522  BP: (!) 124/53 (!) 119/50 (!) 121/52   Pulse: (!) 117 (!) 113 (!) 105   Resp: (!) 29 (!) 30 (!) 21    Temp: 98.3 F (36.8 C) 99.3 F (37.4 C)    TempSrc: Axillary Axillary    SpO2: 100% 100% 100% 100%  Weight:      Height:        Intake/Output Summary (Last 24 hours) at 07/20/17 1551 Last data filed at 07/20/17 1400  Gross per 24 hour  Intake             1735 ml  Output              925 ml  Net              810 ml   Filed Weights   07/18/17 0219 07/18/17 1500 07/19/17 2200  Weight: 78.2 kg (172 lb 6.4 oz) 77.7 kg (171 lb 4.8 oz) 80.9 kg (178 lb 5.6 oz)   Physical Exam  Constitutional: Appears somnolent but able to open eyes and follow some commands  CVS: Regular rhythm, tachycardic  Pulmonary: course breath sounds bilaterally with diminished air movement at bases  Abdominal: Soft. BS +, mild tenderness in epigastric area, no distension  Musculoskeletal: No edema and no tenderness.   Data Reviewed: I have personally reviewed following labs and imaging studies  CBC:  Recent Labs Lab 07/17/17 2142  07/18/17 1628 07/19/17 0048 07/19/17 0739 07/19/17 1144 07/20/17 0339  WBC 18.9*  < > 23.7* 28.4* 30.5* 31.6* 28.1*  NEUTROABS 17.6*  --   --   --   --   --   --   HGB 9.6*  < > 8.3* 8.7* 8.4* 8.6* 7.7*  HCT 30.3*  < > 25.5* 27.9* 26.3* 26.7* 23.9*  MCV 103.8*  < > 102.4* 103.0* 104.0* 103.9* 103.5*  PLT 603*  < > 517* 487* 446* 461* 393  < > = values in this interval not displayed. Basic Metabolic Panel:  Recent Labs Lab 07/17/17 2142 07/17/17 2144 07/18/17 0457 07/18/17 1407 07/19/17 0048 07/20/17 0339  NA 139  --  140 139 150* 140  K 3.1*  --  3.3* 3.7 4.0 3.4*  CL 109  --  111 112* 120* 113*  CO2 17*  --  15* 17* 14* 18*  GLUCOSE 101*  --  144* 102* 78 102*  BUN 12  --  11 13 16  22*  CREATININE 1.74*  --  1.47* 1.43* 1.51* 1.26*  CALCIUM 8.6*  --  7.7* 7.6* 8.2* 7.6*  MG  --  2.0  --   --  1.8 1.7  PHOS  --   --   --   --  3.7  --    Liver Function Tests:  Recent Labs Lab 07/17/17 2142 07/19/17 0048  AST 31  --  ALT 13*  --   ALKPHOS 170*  --    BILITOT 0.8  --   PROT 6.1*  --   ALBUMIN 2.8* 2.6*    Recent Labs Lab 07/17/17 2144  LIPASE 22   Urine analysis:    Component Value Date/Time   COLORURINE YELLOW 07/17/2017 2144   APPEARANCEUR CLEAR 07/17/2017 2144   LABSPEC 1.009 07/17/2017 2144   PHURINE 5.0 07/17/2017 2144   GLUCOSEU NEGATIVE 07/17/2017 2144   HGBUR NEGATIVE 07/17/2017 2144   BILIRUBINUR NEGATIVE 07/17/2017 2144   KETONESUR NEGATIVE 07/17/2017 2144   PROTEINUR NEGATIVE 07/17/2017 2144   UROBILINOGEN 0.2 05/25/2014 1855   NITRITE NEGATIVE 07/17/2017 2144   LEUKOCYTESUR NEGATIVE 07/17/2017 2144   Recent Results (from the past 240 hour(s))  Blood Culture (routine x 2)     Status: None (Preliminary result)   Collection Time: 07/17/17  9:50 PM  Result Value Ref Range Status   Specimen Description BLOOD LEFT WRIST  Final   Special Requests IN PEDIATRIC BOTTLE Blood Culture adequate volume  Final   Culture   Final    NO GROWTH 2 DAYS Performed at [facility], [address] [place], [place] [address]    Report Status PENDING  Incomplete  Blood Culture (routine x 2)     Status: None (Preliminary result)   Collection Time: 07/17/17  9:54 PM  Result Value Ref Range Status   Specimen Description BLOOD LEFT ANTECUBITAL  Final   Special Requests   Final    BOTTLES DRAWN AEROBIC AND ANAEROBIC Blood Culture adequate volume   Culture   Final    NO GROWTH 2 DAYS Performed at [facility], [address] [place], [place] [address]    Report Status PENDING  Incomplete  C difficile quick scan w PCR reflex     Status: None   Collection Time: 07/18/17 12:52 AM  Result Value Ref Range Status   C Diff antigen NEGATIVE NEGATIVE Final   C Diff toxin NEGATIVE NEGATIVE Final   C Diff interpretation No C. difficile detected.  Final  MRSA PCR Screening     Status: None   Collection Time: 07/18/17  6:34 AM  Result Value Ref Range Status   MRSA by PCR NEGATIVE NEGATIVE Final    Comment:        The  GeneXpert MRSA Assay (FDA approved for NASAL specimens only), is one component of a comprehensive MRSA colonization surveillance program. It is not intended to diagnose MRSA infection nor to guide or monitor treatment for MRSA infections.   Gastrointestinal Panel by PCR , Stool     Status: None   Collection Time: 07/18/17  1:32 PM  Result Value Ref Range Status   Campylobacter species NOT DETECTED NOT DETECTED Final   Plesimonas shigelloides NOT DETECTED NOT DETECTED Final   Salmonella species NOT DETECTED NOT DETECTED Final   Yersinia enterocolitica NOT DETECTED NOT DETECTED Final   Vibrio species NOT DETECTED NOT DETECTED Final   Vibrio cholerae NOT DETECTED NOT DETECTED Final   Enteroaggregative E coli (EAEC) NOT DETECTED NOT DETECTED Final   Enteropathogenic E coli (EPEC) NOT DETECTED NOT DETECTED Final   Enterotoxigenic E coli (ETEC) NOT DETECTED NOT DETECTED Final   Shiga like toxin producing E coli (STEC) NOT DETECTED NOT DETECTED Final   Shigella/Enteroinvasive E coli (EIEC) NOT DETECTED NOT DETECTED Final   Cryptosporidium NOT DETECTED NOT DETECTED Final   Cyclospora cayetanensis NOT DETECTED NOT DETECTED Final   Entamoeba histolytica  NOT DETECTED NOT DETECTED Final   Giardia lamblia NOT DETECTED NOT DETECTED Final   Adenovirus F40/41 NOT DETECTED NOT DETECTED Final   Astrovirus NOT DETECTED NOT DETECTED Final   Norovirus GI/GII NOT DETECTED NOT DETECTED Final   Rotavirus A NOT DETECTED NOT DETECTED Final   Sapovirus (I, II, IV, and V) NOT DETECTED NOT DETECTED Final    Radiology Studies: Dg Chest Port 1 View  Result Date: 07/20/2017 CLINICAL DATA:  Dyspnea, asthma, current smoker. EXAM: PORTABLE CHEST 1 VIEW COMPARISON:  07/18/2017 CT chest and 07/17/2017 CXR FINDINGS: The heart size is borderline enlarged with aortic atherosclerosis. The patient is slightly rotated on current exam. No pneumothorax or pulmonary consolidation. No significant effusion nor overt pulmonary  edema. Emphysematous hyperinflation of the lungs, upper lobe predominant. Remote right eighth rib fracture. High-riding humeral heads consistent with chronic rotator cuff tears. Glenohumeral joint osteoarthritis with spurring off the inferomedial aspect of both humeral heads. IMPRESSION: 1. Emphysematous hyperinflation of the lungs. No acute pulmonary consolidation or CHF. 2. Stable cardiomegaly with aortic atherosclerosis. 3. Remote right eighth rib fracture. 4. Osteoarthritis of both glenohumeral joints. Electronically Signed   By: Tollie Eth M.D.   On: 07/20/2017 14:14   Dg Abd Portable 1v  Result Date: 07/20/2017 CLINICAL DATA:  Abdominal pain.  Bloody stools. EXAM: PORTABLE ABDOMEN - 1 VIEW COMPARISON:  CT scan abdomen dated 07/17/2017 FINDINGS: There is increased air in the bowel since the prior study but there are no distended bowel loops. No fecal impaction. No visible free air or free fluid. No acute bone abnormality. Degenerative disc disease in the lumbar spine. IMPRESSION: No significant abnormality. Overall increased air in the bowel since the prior exam. Electronically Signed   By: Francene Boyers M.D.   On: 07/20/2017 14:18   Scheduled Meds: . diazepam  2 mg Oral TID  . mometasone-formoterol  2 puff Inhalation BID  . nicotine  21 mg Transdermal Daily  . [START ON 07/22/2017] pantoprazole  40 mg Intravenous Q12H  . venlafaxine  75 mg Oral BID   Continuous Infusions: . dextrose 30 mL/hr at 07/19/17 1333  . pantoprozole (PROTONIX) infusion 8 mg/hr (07/20/17 1547)  . piperacillin-tazobactam (ZOSYN)  IV 3.375 g (07/20/17 1343)    LOS: 2 days   Time spent: 35 minutes   Debbora Presto, MD [facility] Pager [phone]  If 7PM-7AM, please contact night-coverage [email or web address] Password [email or web address] 07/20/2017, 3:51 PM

## 2017-07-21 ENCOUNTER — Encounter (HOSPITAL_COMMUNITY): Payer: Self-pay | Admitting: Gastroenterology

## 2017-07-21 ENCOUNTER — Inpatient Hospital Stay (HOSPITAL_COMMUNITY): Payer: Medicare Other

## 2017-07-21 DIAGNOSIS — R1010 Upper abdominal pain, unspecified: Secondary | ICD-10-CM

## 2017-07-21 LAB — BASIC METABOLIC PANEL
ANION GAP: 9 (ref 5–15)
BUN: 20 mg/dL (ref 6–20)
CALCIUM: 7.9 mg/dL — AB (ref 8.9–10.3)
CO2: 20 mmol/L — ABNORMAL LOW (ref 22–32)
Chloride: 110 mmol/L (ref 101–111)
Creatinine, Ser: 0.96 mg/dL (ref 0.44–1.00)
GFR, EST NON AFRICAN AMERICAN: 59 mL/min — AB (ref >60)
Glucose, Bld: 99 mg/dL (ref 65–99)
POTASSIUM: 3.3 mmol/L — AB (ref 3.5–5.1)
SODIUM: 139 mmol/L (ref 135–145)

## 2017-07-21 LAB — CBC
HEMATOCRIT: 29.4 % — AB (ref 36.0–46.0)
HEMOGLOBIN: 9.3 g/dL — AB (ref 12.0–15.0)
MCH: 31.4 pg (ref 26.0–34.0)
MCHC: 31.6 g/dL (ref 30.0–36.0)
MCV: 99.3 fL (ref 78.0–100.0)
Platelets: 409 10*3/uL — ABNORMAL HIGH (ref 150–400)
RBC: 2.96 MIL/uL — ABNORMAL LOW (ref 3.87–5.11)
RDW: 17.5 % — ABNORMAL HIGH (ref 11.5–15.5)
WBC: 23.3 10*3/uL — AB (ref 4.0–10.5)

## 2017-07-21 LAB — TYPE AND SCREEN
ABO/RH(D): A POS
ANTIBODY SCREEN: NEGATIVE
Unit division: 0

## 2017-07-21 LAB — BPAM RBC
Blood Product Expiration Date: 201811122359
ISSUE DATE / TIME: 201810291253
Unit Type and Rh: 6200

## 2017-07-21 LAB — MAGNESIUM: Magnesium: 2.5 mg/dL — ABNORMAL HIGH (ref 1.7–2.4)

## 2017-07-21 LAB — OCCULT BLOOD X 1 CARD TO LAB, STOOL: Fecal Occult Bld: NEGATIVE

## 2017-07-21 MED ORDER — IOPAMIDOL (ISOVUE-300) INJECTION 61%
100.0000 mL | Freq: Once | INTRAVENOUS | Status: AC
  Administered 2017-07-21: 100 mL via INTRAVENOUS

## 2017-07-21 MED ORDER — POTASSIUM CHLORIDE 10 MEQ/100ML IV SOLN
10.0000 meq | INTRAVENOUS | Status: AC
  Administered 2017-07-21 (×4): 10 meq via INTRAVENOUS
  Filled 2017-07-21 (×4): qty 100

## 2017-07-21 MED ORDER — IOPAMIDOL (ISOVUE-300) INJECTION 61%
INTRAVENOUS | Status: AC
  Filled 2017-07-21: qty 100

## 2017-07-21 MED ORDER — IOPAMIDOL (ISOVUE-300) INJECTION 61%
15.0000 mL | Freq: Once | INTRAVENOUS | Status: DC | PRN
  Administered 2017-07-21: 15 mL via ORAL
  Filled 2017-07-21: qty 30

## 2017-07-21 MED ORDER — IOPAMIDOL (ISOVUE-300) INJECTION 61%
INTRAVENOUS | Status: AC
  Filled 2017-07-21: qty 30

## 2017-07-21 NOTE — Clinical Social Work Note (Addendum)
UPDATE: CSW spoke with patients son-in-law, Danielle [name] [phone], regarding discharge plans. Danielle [name] states patient has [facility] living [facility] for the past year however has been in multiple facilities throughout the years. Danielle [name] states patients family has been attempting to get patient into a SNF for about a month however patient has not been agreeable to work with PT. Danielle [name] states family would prefer patient discharge to skilled nursing facility but understands if that is not an option. CSW explained need for PT recommendation for SNF placement- he stated understanding. CSW informed Danielle [name] that patient could return [facility], if needed, Danielle [name] was agreeable to this option as well. Danielle [name] stated "whatever works best for her we are all okay with". Son-in-law very pleasant and open to discharge options.   Clinical Social Work Assessment  Patient Details  Name: Danielle [name] MRN: [id number] Date of Birth: 1945-12-20  Date of referral:  07/21/17               Reason for consult:  Discharge Planning                Permission sought to share information with:    Permission granted to share information::     Name::        Agency::     Relationship::     Contact Information:     Housing/Transportation Living arrangements for the past 2 months:  Assisted Living Facility Source of Information:  Facility Patient Interpreter Needed:  None Criminal Activity/Legal Involvement Pertinent to Current Situation/Hospitalization:    Significant Relationships:  Adult Children Lives with:  Facility Resident [facility] Do you feel safe going back to the place where you live?    Need for family participation in patient care:     Care giving concerns:  Patient is not oriented and CSW was unable to reach patients son/ daughter.    Social Worker assessment / plan:  CSW noted in chart patient is a resident at [facility]. CSW spoke with Danielle [name],  representative from [facility], patient is able to return to facility once medically stable and if family is agreeable. Patient is able to receive home health services at facility through [facility]- if family is agreeable. CSW will continue to reach out to family for additional information on potential discharge plans.   Employment status:  Retired Health and safety inspectornsurance information:  Medicare PT Recommendations:  Not assessed at this time Information / Referral to community resources:     Patient/Family's Response to care:  Unknown.  Patient/Family's Understanding of and Emotional Response to Diagnosis, Current Treatment, and Prognosis:  Unknown   Emotional Assessment Appearance:    Attitude/Demeanor/Rapport:    Affect (typically observed):    Orientation:    Alcohol / Substance use:    Psych involvement (Current and /or in the community):  No (Comment)  Discharge Needs  Concerns to be addressed:  No discharge needs identified Readmission within the last 30 days:  No Current discharge risk:  None Barriers to Discharge:  No Barriers Identified   Danielle Coffinrin M Marlyn Tondreau, LCSW 07/21/2017, 2:26 PM

## 2017-07-21 NOTE — Progress Notes (Addendum)
Patient ID: Danielle [name], female   DOB: 1946-07-03, 71 y.o.   MRN: [id number]    PROGRESS NOTE  Danielle [name]  WUJ:[id number] DOB: Oct 07, 1945 DOA: 07/17/2017  PCP: Patient, No Pcp Per   Brief Narrative:  Pt is 71 yo female with known hx of CVA, intra cranial hemorrhage, resides in SNF, presented to [facility] ED for evaluation of several days duration of epigastric abd pain, weakness and poor oral intake, watery diarrhea and non bloody vomiting. BP at the facility was 70/52 and she was sent to ED.  In ED, pt was tachycardic with HR up to 110's, BP 97/54, K 3.1, Cr 1.7, lipase 22, WBC 18.9. CT renal stone study notable for possible duodenitis vs pancreatitis, CXR unremarkable. Pt started on IV Vanc, Zosyn, empiric oral vanc for presumptive C. Diff and also started on IVF, admitted to telemetry for further evaluation.  Major events since admission: Morning 10/27 - pt became more agitated, restless, increased work of breathing. Blood work notable for worsening WBC up from 18 --> 25 K. Had one episode of blood bowel movement. Requiring transfer to SDU and consultation by PCCM team.  10/28 - still rather confused, remains in SDU  10/29 - still somnolent, remains in SDU on BiPAP, needs one U PRBC due to drop in Hg 10/30 - more alert this AM, still on BiPAP  Assessment & Plan:   Principal Problem:   Sepsis, septic shock - unclear etiology, C. Diff negative - suspected duodenitis, colitis, not convincing for pancreatitis given stable lipase - possible duodenitis, colitis, not really convincing for pancreatitis given stable lipase - has been on Zosyn today is day #5, continue same regimen - Vancomycin has been discontinued on 07/20/2017 - White blood cell is trending down but still elevated, will obtain CT abd and pelvis for further evaluation, will also speak with family again to clarify pt's wishes  - CBC in the morning    Bloody stools - noted 10/27, RN also reported pulling straw from rectal  area  - at baseline Hg ~9-10 - Hg down to 7.7 on 07/20/2017, patient was transfused 1 unit PRBC with appropriate increase in posttransfusion hemoglobin - Hg 9.3 this a.m. - keep on PPI for now - per daughter, pt reluctant to have any invasive interventions done, not sure about endoscopy, she asked to wait until mom more alert to discuss - currently no reports of blood in stool, abd still tender - will speak with daughter again regarding pt's specific wishes regarding care as per daughter, pt is very specific about what she wants in terms of medical care     Tachycardia - suspect this is reactive process in the setting of the above illnesses - Okay to use metoprolol if needed    AKI (acute kidney injury) (HCC) - suspect pre renal etiology - IV fluids have been provided and creatinine is now within normal limits  Active Problems:   Abdominal pain, diarrhea  - ? Duodenitis, per CT imaging, unable to obtain clear history from pt  - allow analgesia as needed - C. Diff negative, stool GI panel negative  - ABX XRAY 10/29 with increased air in bowel    Acute respiratory failure in pt with known hx of COPD (chronic obstructive pulmonary disease) (HCC), hx of smoking  - has required placement on BiPAP, still on BiPAP this morning - continue BD's as needed  - PCCM following  - nicotine patch     Acute metabolic and toxic encephalopathy - patient more  alert this morning however, still confused, asking to be left alone - Keep in step down unit for now    Hx of depression - per daughter, pt has hx of depression and multiple other psychiatric issues of which she was not sure  - daughter explains she has bene on multiple medications  - cont Effexor, cont valium, watch for signs of withdrawal     Hypernatremia - have changed fluids to D5 and Na has remained within normal limits - BMP in the morning    Hypokalemia - still low, continue to supplement    Thrombocytosis  - suspect  reactive - we'll monitor    Obesity  - Body mass index is 32.57 kg/m.  DVT prophylaxis: SCD's Code Status: DNR Family Communication: spoke with daughter over the phone, daughter asked not tell mom that she will be coming to visit Disposition Plan: keep in SDU  Consultants:   PCCM  GI  Procedures:   None  Antimicrobials:   Zosyn 10/26 -->  Vancomycin 10/26 --> 10/29   Oral Vancomycin 10/26 --> 10/27   Subjective: On BiPAP, more alert.   Objective: Vitals:   07/21/17 0313 07/21/17 0500 07/21/17 0800 07/21/17 0812  BP:   127/65   Pulse:   (!) 109   Resp:   (!) 22   Temp: 99.7 F (37.6 C)  98.2 F (36.8 C)   TempSrc: Axillary  Oral   SpO2:   100% 100%  Weight:  81 kg (178 lb 9.2 oz)    Height:        Intake/Output Summary (Last 24 hours) at 07/21/17 1025 Last data filed at 07/21/17 0951  Gross per 24 hour  Intake          1691.09 ml  Output              950 ml  Net           741.09 ml   Filed Weights   07/18/17 1500 07/19/17 2200 07/21/17 0500  Weight: 77.7 kg (171 lb 4.8 oz) 80.9 kg (178 lb 5.6 oz) 81 kg (178 lb 9.2 oz)   Physical Exam  Constitutional: Appears more alert but still confused, on BiPAP CVS: tachycardic  Pulmonary: more clear but still diminished at bases, no wheezing  Abdominal: Soft. BS +,  no distension, mildly tender in epigastric area   Data Reviewed: I have personally reviewed following labs and imaging studies  CBC:  Recent Labs Lab 07/17/17 2142  07/19/17 0048 07/19/17 0739 07/19/17 1144 07/20/17 0339 07/21/17 0315  WBC 18.9*  < > 28.4* 30.5* 31.6* 28.1* 23.3*  NEUTROABS 17.6*  --   --   --   --   --   --   HGB 9.6*  < > 8.7* 8.4* 8.6* 7.7* 9.3*  HCT 30.3*  < > 27.9* 26.3* 26.7* 23.9* 29.4*  MCV 103.8*  < > 103.0* 104.0* 103.9* 103.5* 99.3  PLT 603*  < > 487* 446* 461* 393 409*  < > = values in this interval not displayed. Basic Metabolic Panel:  Recent Labs Lab 07/17/17 2144 07/18/17 0457 07/18/17 1407  07/19/17 0048 07/20/17 0339 07/21/17 0315  NA  --  140 139 150* 140 139  K  --  3.3* 3.7 4.0 3.4* 3.3*  CL  --  111 112* 120* 113* 110  CO2  --  15* 17* 14* 18* 20*  GLUCOSE  --  144* 102* 78 102* 99  BUN  --  11 13  16 22* 20  CREATININE  --  1.47* 1.43* 1.51* 1.26* 0.96  CALCIUM  --  7.7* 7.6* 8.2* 7.6* 7.9*  MG 2.0  --   --  1.8 1.7 2.5*  PHOS  --   --   --  3.7  --   --    Liver Function Tests:  Recent Labs Lab 07/17/17 2142 07/19/17 0048  AST 31  --   ALT 13*  --   ALKPHOS 170*  --   BILITOT 0.8  --   PROT 6.1*  --   ALBUMIN 2.8* 2.6*    Recent Labs Lab 07/17/17 2144  LIPASE 22   Urine analysis:    Component Value Date/Time   COLORURINE YELLOW 07/17/2017 2144   APPEARANCEUR CLEAR 07/17/2017 2144   LABSPEC 1.009 07/17/2017 2144   PHURINE 5.0 07/17/2017 2144   GLUCOSEU NEGATIVE 07/17/2017 2144   HGBUR NEGATIVE 07/17/2017 2144   BILIRUBINUR NEGATIVE 07/17/2017 2144   KETONESUR NEGATIVE 07/17/2017 2144   PROTEINUR NEGATIVE 07/17/2017 2144   UROBILINOGEN 0.2 05/25/2014 1855   NITRITE NEGATIVE 07/17/2017 2144   LEUKOCYTESUR NEGATIVE 07/17/2017 2144   Recent Results (from the past 240 hour(s))  Blood Culture (routine x 2)     Status: None (Preliminary result)   Collection Time: 07/17/17  9:50 PM  Result Value Ref Range Status   Specimen Description BLOOD LEFT WRIST  Final   Special Requests IN PEDIATRIC BOTTLE Blood Culture adequate volume  Final   Culture   Final    NO GROWTH 2 DAYS Performed at [facility], [address] [place], [place] [address]    Report Status PENDING  Incomplete  Blood Culture (routine x 2)     Status: None (Preliminary result)   Collection Time: 07/17/17  9:54 PM  Result Value Ref Range Status   Specimen Description BLOOD LEFT ANTECUBITAL  Final   Special Requests   Final    BOTTLES DRAWN AEROBIC AND ANAEROBIC Blood Culture adequate volume   Culture   Final    NO GROWTH 2 DAYS Performed at [facility],  [address] [place], [place] [address]    Report Status PENDING  Incomplete  C difficile quick scan w PCR reflex     Status: None   Collection Time: 07/18/17 12:52 AM  Result Value Ref Range Status   C Diff antigen NEGATIVE NEGATIVE Final   C Diff toxin NEGATIVE NEGATIVE Final   C Diff interpretation No C. difficile detected.  Final  MRSA PCR Screening     Status: None   Collection Time: 07/18/17  6:34 AM  Result Value Ref Range Status   MRSA by PCR NEGATIVE NEGATIVE Final    Comment:        The GeneXpert MRSA Assay (FDA approved for NASAL specimens only), is one component of a comprehensive MRSA colonization surveillance program. It is not intended to diagnose MRSA infection nor to guide or monitor treatment for MRSA infections.   Gastrointestinal Panel by PCR , Stool     Status: None   Collection Time: 07/18/17  1:32 PM  Result Value Ref Range Status   Campylobacter species NOT DETECTED NOT DETECTED Final   Plesimonas shigelloides NOT DETECTED NOT DETECTED Final   Salmonella species NOT DETECTED NOT DETECTED Final   Yersinia enterocolitica NOT DETECTED NOT DETECTED Final   Vibrio species NOT DETECTED NOT DETECTED Final   Vibrio cholerae NOT DETECTED NOT DETECTED Final   Enteroaggregative E coli (EAEC) NOT  DETECTED NOT DETECTED Final   Enteropathogenic E coli (EPEC) NOT DETECTED NOT DETECTED Final   Enterotoxigenic E coli (ETEC) NOT DETECTED NOT DETECTED Final   Shiga like toxin producing E coli (STEC) NOT DETECTED NOT DETECTED Final   Shigella/Enteroinvasive E coli (EIEC) NOT DETECTED NOT DETECTED Final   Cryptosporidium NOT DETECTED NOT DETECTED Final   Cyclospora cayetanensis NOT DETECTED NOT DETECTED Final   Entamoeba histolytica NOT DETECTED NOT DETECTED Final   Giardia lamblia NOT DETECTED NOT DETECTED Final   Adenovirus F40/41 NOT DETECTED NOT DETECTED Final   Astrovirus NOT DETECTED NOT DETECTED Final   Norovirus GI/GII NOT DETECTED NOT DETECTED Final   Rotavirus  A NOT DETECTED NOT DETECTED Final   Sapovirus (I, II, IV, and V) NOT DETECTED NOT DETECTED Final    Radiology Studies: Dg Chest Port 1 View  Result Date: 07/20/2017 CLINICAL DATA:  Dyspnea, asthma, current smoker. EXAM: PORTABLE CHEST 1 VIEW COMPARISON:  07/18/2017 CT chest and 07/17/2017 CXR FINDINGS: The heart size is borderline enlarged with aortic atherosclerosis. The patient is slightly rotated on current exam. No pneumothorax or pulmonary consolidation. No significant effusion nor overt pulmonary edema. Emphysematous hyperinflation of the lungs, upper lobe predominant. Remote right eighth rib fracture. High-riding humeral heads consistent with chronic rotator cuff tears. Glenohumeral joint osteoarthritis with spurring off the inferomedial aspect of both humeral heads. IMPRESSION: 1. Emphysematous hyperinflation of the lungs. No acute pulmonary consolidation or CHF. 2. Stable cardiomegaly with aortic atherosclerosis. 3. Remote right eighth rib fracture. 4. Osteoarthritis of both glenohumeral joints. Electronically Signed   By: Tollie Eth M.D.   On: 07/20/2017 14:14   Dg Abd Portable 1v  Result Date: 07/20/2017 CLINICAL DATA:  Abdominal pain.  Bloody stools. EXAM: PORTABLE ABDOMEN - 1 VIEW COMPARISON:  CT scan abdomen dated 07/17/2017 FINDINGS: There is increased air in the bowel since the prior study but there are no distended bowel loops. No fecal impaction. No visible free air or free fluid. No acute bone abnormality. Degenerative disc disease in the lumbar spine. IMPRESSION: No significant abnormality. Overall increased air in the bowel since the prior exam. Electronically Signed   By: Francene Boyers M.D.   On: 07/20/2017 14:18   Scheduled Meds: . diazepam  2 mg Oral TID  . mometasone-formoterol  2 puff Inhalation BID  . nicotine  21 mg Transdermal Daily  . pantoprazole  40 mg Intravenous Q12H  . venlafaxine  75 mg Oral BID   Continuous Infusions: . dextrose 30 mL/hr at 07/20/17 2159   . magnesium sulfate 1 - 4 g bolus IVPB    . piperacillin-tazobactam (ZOSYN)  IV Stopped (07/21/17 0917)    LOS: 3 days   Time spent: 25 minutes   Debbora Presto, MD [facility] Pager [phone]  If 7PM-7AM, please contact night-coverage [email or web address] Password [email or web address] 07/21/2017, 10:25 AM

## 2017-07-21 NOTE — Progress Notes (Signed)
Pharmacy Antibiotic Note  Danielle [name] is a [age] y.o. female admitted on 07/17/2017 with possible colitis, received vancomycin 1 gram IV and was started on empiric Zosyn.  Worse today with more agitation and restlessness, increased work of breathing.  Being transferred to SDU, orders received to resume IV vancomycin and continue Zosyn for sepsis  Pt now on Zosyn.  Plan:  Continue Zosyn 3.375 grams IV q8h (4-hour infusion)  Follow culture results, clinical course   Height: 5\' 1"  (154.9 cm) Weight: 178 lb 9.2 oz (81 kg) IBW/kg (Calculated) : 47.8  Temp (24hrs), Avg:98.8 F (37.1 C), Min:98 F (36.7 C), Max:99.7 F (37.6 C)   Recent Labs Lab 07/17/17 2153 07/17/17 2352 07/18/17 0457 07/18/17 1407  07/18/17 1642 07/19/17 0048 07/19/17 0739 07/19/17 1144 07/20/17 0339 07/21/17 0315  WBC  --   --  25.5* 24.3*  < >  --  28.4* 30.5* 31.6* 28.1* 23.3*  CREATININE  --   --  1.47* 1.43*  --   --  1.51*  --   --  1.26* 0.96  LATICACIDVEN 2.13* 0.92  --  2.1*  --  1.7  --   --   --   --   --   < > = values in this interval not displayed.  Estimated Creatinine Clearance: 53.3 mL/min (by C-G formula based on SCr of 0.96 mg/dL).    No Known Allergies  Antimicrobials this admission: 10/26 BCx: NGTD 10/26 CDiff: negative/negative 10/27 MRSA PCR: negative 10/27 GI panel: neg 10/27 C. Difficile: neg/neg   Dose adjustments this admission:    Thank you for allowing pharmacy to be a part of this patient's care.  Danielle [name], PharmD, BCPS Pager [phone] 07/21/2017 1:05 PM

## 2017-07-21 NOTE — Consult Note (Signed)
Reason for Consult: GI blood loss Referring Physician: Hospital team  Danielle [name] is an 71 y.o. female.  HPI: Patient seen and examined and hospital computer chart reviewed and case discussed yesterday with the hospital team and she seems to be breathing better than she's been over the weekend but she does not have any GI complaints and really only answers yes and no questions and has not had a bowel movement today and was guaiac-negative yesterday and her recent CT only showed some minimal colonic wall inflammation probably with some mild ischemia and they ordered a follow-up CT today which is not back in the Hospital team was not aware of any previous GI workup  Past Medical History:  Diagnosis Date  . Anemia   . Anxiety   . Arthritis    "some; all over" (05/29/2014)  . Asthma   . Chronic airway obstruction ([place])   . COPD (chronic obstructive pulmonary disease) ([place])   . Dementia   . Depression   . Encephalopathy   . Esophagitis   . Essential hypertension   . GERD (gastroesophageal reflux disease)   . GIB (gastrointestinal bleeding)   . HLD (hyperlipidemia)   . Hypopotassemia   . Hyposmolality and/or hyponatremia   . Insomnia   . Tobacco abuse     Past Surgical History:  Procedure Laterality Date  . ABDOMINAL HYSTERECTOMY    . APPENDECTOMY    . CHOLECYSTECTOMY    . TONSILLECTOMY    . TUBAL LIGATION      Family History  Problem Relation Age of Onset  . CVA Mother     Social History:  reports that she has been smoking Cigarettes.  She has a 12.50 pack-year smoking history. She has never used smokeless tobacco. She reports that she does not drink alcohol or use drugs.  Allergies: No Known Allergies  Medications: I have reviewed the patient's current medications.  Results for orders placed or performed during the hospital encounter of 07/17/17 (from the past 48 hour(s))  Procalcitonin     Status: None   Collection Time: 07/20/17  3:39 AM  Result Value Ref Range   Procalcitonin 4.37 ng/mL    Comment:        Interpretation: PCT > 2 ng/mL: Systemic infection (sepsis) is likely, unless other causes are known. (NOTE)         ICU PCT Algorithm               Non ICU PCT Algorithm    ----------------------------     ------------------------------         PCT < 0.25 ng/mL                 PCT < 0.1 ng/mL     Stopping of antibiotics            Stopping of antibiotics       strongly encouraged.               strongly encouraged.    ----------------------------     ------------------------------       PCT level decrease by               PCT < 0.25 ng/mL       >= 80% from peak PCT       OR PCT 0.25 - 0.5 ng/mL          Stopping of antibiotics  encouraged.     Stopping of antibiotics           encouraged.    ----------------------------     ------------------------------       PCT level decrease by              PCT >= 0.25 ng/mL       < 80% from peak PCT        AND PCT >= 0.5 ng/mL            Continuing antibiotics                                               encouraged.       Continuing antibiotics            encouraged.    ----------------------------     ------------------------------     PCT level increase compared          PCT > 0.5 ng/mL         with peak PCT AND          PCT >= 0.5 ng/mL             Escalation of antibiotics                                          strongly encouraged.      Escalation of antibiotics        strongly encouraged.   CBC     Status: Abnormal   Collection Time: 07/20/17  3:39 AM  Result Value Ref Range   WBC 28.1 (H) 4.0 - 10.5 K/uL   RBC 2.31 (L) 3.87 - 5.11 MIL/uL   Hemoglobin 7.7 (L) 12.0 - 15.0 g/dL   HCT 23.9 (L) 36.0 - 46.0 %   MCV 103.5 (H) 78.0 - 100.0 fL   MCH 33.3 26.0 - 34.0 pg   MCHC 32.2 30.0 - 36.0 g/dL   RDW 15.9 (H) 11.5 - 15.5 %   Platelets 393 150 - 400 K/uL  Basic metabolic panel     Status: Abnormal   Collection Time: 07/20/17  3:39 AM  Result  Value Ref Range   Sodium 140 135 - 145 mmol/L    Comment: DELTA CHECK NOTED   Potassium 3.4 (L) 3.5 - 5.1 mmol/L   Chloride 113 (H) 101 - 111 mmol/L   CO2 18 (L) 22 - 32 mmol/L   Glucose, Bld 102 (H) 65 - 99 mg/dL   BUN 22 (H) 6 - 20 mg/dL   Creatinine, Ser 1.26 (H) 0.44 - 1.00 mg/dL   Calcium 7.6 (L) 8.9 - 10.3 mg/dL   GFR calc non Af Amer 42 (L) >60 mL/min   GFR calc Af Amer 49 (L) >60 mL/min    Comment: (NOTE) The eGFR has been calculated using the CKD EPI equation. This calculation has not been validated in all clinical situations. eGFR's persistently <60 mL/min signify possible Chronic Kidney Disease.    Anion gap 9 5 - 15  Magnesium     Status: None   Collection Time: 07/20/17  3:39 AM  Result Value Ref Range   Magnesium 1.7 1.7 - 2.4 mg/dL  Prepare RBC     Status: None   Collection Time: 07/20/17  8:30 AM  Result Value Ref Range   Order Confirmation ORDER PROCESSED BY BLOOD BANK   Occult blood card to lab, stool     Status: None   Collection Time: 07/20/17  2:16 PM  Result Value Ref Range   Fecal Occult Bld NEGATIVE NEGATIVE  Occult blood card to lab, stool     Status: Abnormal   Collection Time: 07/20/17 10:15 PM  Result Value Ref Range   Fecal Occult Bld POSITIVE (A) NEGATIVE  Basic metabolic panel     Status: Abnormal   Collection Time: 07/21/17  3:15 AM  Result Value Ref Range   Sodium 139 135 - 145 mmol/L   Potassium 3.3 (L) 3.5 - 5.1 mmol/L   Chloride 110 101 - 111 mmol/L   CO2 20 (L) 22 - 32 mmol/L   Glucose, Bld 99 65 - 99 mg/dL   BUN 20 6 - 20 mg/dL   Creatinine, Ser 0.96 0.44 - 1.00 mg/dL   Calcium 7.9 (L) 8.9 - 10.3 mg/dL   GFR calc non Af Amer 59 (L) >60 mL/min   GFR calc Af Amer >60 >60 mL/min    Comment: (NOTE) The eGFR has been calculated using the CKD EPI equation. This calculation has not been validated in all clinical situations. eGFR's persistently <60 mL/min signify possible Chronic Kidney Disease.    Anion gap 9 5 - 15  CBC      Status: Abnormal   Collection Time: 07/21/17  3:15 AM  Result Value Ref Range   WBC 23.3 (H) 4.0 - 10.5 K/uL   RBC 2.96 (L) 3.87 - 5.11 MIL/uL   Hemoglobin 9.3 (L) 12.0 - 15.0 g/dL   HCT 29.4 (L) 36.0 - 46.0 %   MCV 99.3 78.0 - 100.0 fL   MCH 31.4 26.0 - 34.0 pg   MCHC 31.6 30.0 - 36.0 g/dL   RDW 17.5 (H) 11.5 - 15.5 %   Platelets 409 (H) 150 - 400 K/uL  Magnesium     Status: Abnormal   Collection Time: 07/21/17  3:15 AM  Result Value Ref Range   Magnesium 2.5 (H) 1.7 - 2.4 mg/dL  Occult blood card to lab, stool     Status: None   Collection Time: 07/21/17  5:30 AM  Result Value Ref Range   Fecal Occult Bld NEGATIVE NEGATIVE    Dg Chest Port 1 View  Result Date: 07/20/2017 CLINICAL DATA:  Dyspnea, asthma, current smoker. EXAM: PORTABLE CHEST 1 VIEW COMPARISON:  07/18/2017 CT chest and 07/17/2017 CXR FINDINGS: The heart size is borderline enlarged with aortic atherosclerosis. The patient is slightly rotated on current exam. No pneumothorax or pulmonary consolidation. No significant effusion nor overt pulmonary edema. Emphysematous hyperinflation of the lungs, upper lobe predominant. Remote right eighth rib fracture. High-riding humeral heads consistent with chronic rotator cuff tears. Glenohumeral joint osteoarthritis with spurring off the inferomedial aspect of both humeral heads. IMPRESSION: 1. Emphysematous hyperinflation of the lungs. No acute pulmonary consolidation or CHF. 2. Stable cardiomegaly with aortic atherosclerosis. 3. Remote right eighth rib fracture. 4. Osteoarthritis of both glenohumeral joints. Electronically Signed   By: Ashley Royalty M.D.   On: 07/20/2017 14:14   Dg Abd Portable 1v  Result Date: 07/20/2017 CLINICAL DATA:  Abdominal pain.  Bloody stools. EXAM: PORTABLE ABDOMEN - 1 VIEW COMPARISON:  CT scan abdomen dated 07/17/2017 FINDINGS: There is increased air in the bowel since the prior study but there are no distended bowel loops. No fecal impaction. No visible  free  air or free fluid. No acute bone abnormality. Degenerative disc disease in the lumbar spine. IMPRESSION: No significant abnormality. Overall increased air in the bowel since the prior exam. Electronically Signed   By: Lorriane Shire M.D.   On: 07/20/2017 14:18    ROSNegative except above  Blood pressure (!) 109/34, pulse (!) 113, temperature 98 F (36.7 C), temperature source Axillary, resp. rate (!) 25, height _0  (1.549 m), weight 81 kg (178 lb 9.2 oz), SpO2 100 %. Physical ExamPatient lying comfortably in the bed without any breathing problems currently abdomen is soft nontender last CT reviewed white count 23 but decreased from a high 2 days ago hemoglobin okay BUN and creatinine okay  Assessment/Plan: Failure to thrive questionable etiology Plan: Will await follow-up CT scan but she probably did have some mild colonic ischemia while she was hypoxic and hypotensive and I do not think she needs any endoscopic studies at this time and will check on tomorrow and follow-up CT to be sure and please call us back if any specific question or problem we can help with otherwise when you think she is alert enough may try to advance her diet and care with aspirin or blood thinners in the future unless absolutely needed  [facility] E 07/21/2017, 3:06 PM

## 2017-07-21 NOTE — Progress Notes (Addendum)
Pt seen and given scheduled Dulera which she tolerated well.  No increased wob or respiratory distress noted or voiced by pt at this time.  HR114, RR25, spo2 97% on room air.  Bipap in room on standby but not indicated at this time.  Rt will monitor and assess as needed.

## 2017-07-21 NOTE — Progress Notes (Signed)
[facility] Attending Consult  Physician Requesting Consult:  Danielle [name], M.D. / [facility]  Date of Admission:  07/17/2017  Date of Consult:  07/18/2017  Reason for Consult/Chief Complaint:  Sepsis   History of Presenting Illness:  History obtained from the patient's rounding hospitalist as well as the electronic medical record given her altered mental status. 71 y.o. female with known history of prior CVA with intracranial hemorrhage who currently resides at a skilled nursing facility. Presented with several days' duration epigastric abdominal pain with weakness and poor oral intake. Patient has been experiencing watery diarrhea as well as nonbloody emesis. At her facility she was noted to be hypotensive and was subsequently sent to the emergency department.In the emergency department she was noted to have a leukocytosis and elevated serum creatinine and tachycardia. CT imaging was notable for possible duodenitis versus pancreatitis. Patient was subsequently started on IV vancomycin and Zosyn as well as empiric oral vancomycin for presumed C. Difficile colitis. Patient was noted to be more agitated and restless with increased work of breathing today prompting transfer to the stepdown unit. Per documentation from the patient's daughter she does get agitated and restless at times when she is ill. She also reported that her mother is still smoking cigarettes and would likely benefit from a nicotine patch. The patient is borderline hypotensive but notably did receive lasix 40mg  this morning at 11am as well as Oxycodone IR. Patient is endorsing back pain, abdominal pain, and leg pain.   Subjective:   Off BiPAP currently  Temp:  [98.1 F (36.7 C)-99.7 F (37.6 C)] 98.2 F (36.8 C) (10/30 0800) Pulse Rate:  [102-124] 109 (10/30 0800) Resp:  [21-31] 22 (10/30 0800) BP: (99-127)/(50-65) 127/65 (10/30 0800) SpO2:  [100 %] 100 % (10/30 0812) FiO2 (%):  [28 %-30 %] 28 % (10/30  0812) Weight:  [178 lb 9.2 oz (81 kg)] 178 lb 9.2 oz (81 kg) (10/30 0500)  Intake/Output Summary (Last 24 hours) at 07/21/17 1108 Last data filed at 07/21/17 0951  Gross per 24 hour  Intake          1691.09 ml  Output              950 ml  Net           741.09 ml    Physical exam General: Elderly 71 year old female appears much older than stated age.  She is withdrawn, but cooperative.  She still has significant abdominal discomfort. HEENT: Normocephalic atraumatic no jugular venous distention.  Mucous membranes are moist Pulmonary: Remains slightly tachypneic.  Decreased bases, some accessory muscle use at times Cardiac: Regular rate and rhythm no murmur rub or gallop Abdomen: Slightly distended, hypoactive bowel sounds, remains painful to palpation Extremities/musculoskeletal: Moves all extremities, equal strength and bulk, no significant edema, warm to palpation, brisk cap refill good pulses Neuro: Awake oriented x1, moves all extremities, withdrawn  LINES/TUBES: Foley >>> PIV   CBC Recent Labs     07/19/17  1144  07/20/17  0339  07/21/17  0315  WBC  31.6*  28.1*  23.3*  HGB  8.6*  7.7*  9.3*  HCT  26.7*  23.9*  29.4*  PLT  461*  393  409*    Coag's Recent Labs     07/18/17  1627  APTT  45*  INR  1.35    BMET Recent Labs     07/19/17  0048  07/20/17  0339  07/21/17  0315  NA  150*  140  139  K  4.0  3.4*  3.3*  CL  120*  113*  110  CO2  14*  18*  20*  BUN  16  22*  20  CREATININE  1.51*  1.26*  0.96  GLUCOSE  78  102*  99    Electrolytes Recent Labs     07/19/17  0048  07/20/17  0339  07/21/17  0315  CALCIUM  8.2*  7.6*  7.9*  MG  1.8  1.7  2.5*  PHOS  3.7   --    --     Sepsis Markers Recent Labs     07/18/17  1407  07/19/17  0048  07/20/17  0339  PROCALCITON  30.14  17.15  4.37    ABG No results for input(s): PHART, PCO2ART, PO2ART in the last 72 hours.  Liver Enzymes Recent Labs     07/19/17  0048  ALBUMIN  2.6*    Cardiac  Enzymes No results for input(s): TROPONINI, PROBNP in the last 72 hours.  Glucose Recent Labs     07/18/17  1342  GLUCAP  71    Imaging Dg Chest Port 1 View  Result Date: 07/20/2017 CLINICAL DATA:  Dyspnea, asthma, current smoker. EXAM: PORTABLE CHEST 1 VIEW COMPARISON:  07/18/2017 CT chest and 07/17/2017 CXR FINDINGS: The heart size is borderline enlarged with aortic atherosclerosis. The patient is slightly rotated on current exam. No pneumothorax or pulmonary consolidation. No significant effusion nor overt pulmonary edema. Emphysematous hyperinflation of the lungs, upper lobe predominant. Remote right eighth rib fracture. High-riding humeral heads consistent with chronic rotator cuff tears. Glenohumeral joint osteoarthritis with spurring off the inferomedial aspect of both humeral heads. IMPRESSION: 1. Emphysematous hyperinflation of the lungs. No acute pulmonary consolidation or CHF. 2. Stable cardiomegaly with aortic atherosclerosis. 3. Remote right eighth rib fracture. 4. Osteoarthritis of both glenohumeral joints. Electronically Signed   By: Tollie Eth M.D.   On: 07/20/2017 14:14   Dg Abd Portable 1v  Result Date: 07/20/2017 CLINICAL DATA:  Abdominal pain.  Bloody stools. EXAM: PORTABLE ABDOMEN - 1 VIEW COMPARISON:  CT scan abdomen dated 07/17/2017 FINDINGS: There is increased air in the bowel since the prior study but there are no distended bowel loops. No fecal impaction. No visible free air or free fluid. No acute bone abnormality. Degenerative disc disease in the lumbar spine. IMPRESSION: No significant abnormality. Overall increased air in the bowel since the prior exam. Electronically Signed   By: Francene Boyers M.D.   On: 07/20/2017 14:18     IMAGING/STUDIES: CT RENAL STONE 10/26: IMPRESSION: 1. Mild edema/soft tissue stranding at the proximal duodenum/near head of pancreas. Findings could relate to duodenitis and or pancreatitis. Suggest correlation with enzymes. 2. Mild  wall thickening of the sigmoid colon with hazy edema/inflammation in the pericolonic fat surrounding the rectosigmoid colon. Findings could be secondary to a mild colitis of infectious or inflammatory etiology, ischemic colitis may also be considered given history of hypotension. Relatively large volume of stool in the colon and rectum. 3. Negative for hydronephrosis, intrarenal stone or ureteral stone. 4. Chronic compression fracture at T8 TTE 10/27:  Moderate LVH with normal cavity size. EF 65-70% with normal regional wall motion & grade 1 diastolic dysfunction. LA & RA normal in size. RV normal in size and function. No aortic stenosis or regurgitation. Aortic root normal in size. No mitral stenosis or regurgitation. No pulmonic stenosis. No tricuspid regurgitation. No pericardial effusion. CT CHEST W/O 10/27:  Previously reviewed by me. Compression of thoracic vertebral bodies noted. No pleural effusion. No pericardial effusion. No pleural thickening. No pathologic mediastinal adenopathy. No parenchymal mass or consolidation.calcified nodules are present in left lower lobe.  MICROBIOLOGY: U/A 10/26:  Nitrite Negative/ Leukocytes Negative MRSA PCR 10/27:  Negative Stool C diff 10/27:  Negative Gastrointestinal Panel PCR 10/27 >>>negative  Blood Cultures x2 10/26 >>>  ANTIBIOTICS: Vancomycin 10/27 >>> 10/30 Zosyn 10/27 >>>  SIGNIFICANT EVENTS: 10/26 - Admit from SNF w/ hypotension & clinical picture of SIRS vs Sepsis 10/27 - Transfer to SDU/ICU w/ worsening mental status, tachypnea, &  Possible GIB  ASSESSMENT/PLAN:   SIRS/Probable Sepsis: Presumed due to acute duodenitis White blood cell count still a little better. Plan Day 5 of  Zosyn Consider repeat imaging of the abdomen pelvis to make sure were not missing abscess Continue gentle hydration  Bloody stools, with mild anemia.  Also has abd pain. hgb has drifted down some.  Received blood on 10/29 plan  continue PPI twice  daily Defer GI evaluation/consultation to primary service Follow-up CBC in a.m.  Acute encephalopathy with baseline dementia: Likely some element of toxic metabolic encephalopathy. Plan Continuing home Valium and Effexor Continue supportive care  Acute hypoxic respiratory failure, has history of COPD/asthma, still active smoker. Tachypnea  -CT chest was w/out acute findings  This seemed to be more due to pain from abdomen than actual pulmonary pathology Plan Treat pain Pulse oximetry Oxygen as needed Continue bronchodilators  Acute renal failure, with non-anion gap metabolic acidosis in the setting of hyperchloremia: Creatinine continues to improve, as has her acid-base Plan Continue IV fluids renal dose medications  Discussion    71 y.o. female presenting with abdominal pain and acute renal failure. Patient has probable sepsis and certainly has SIRS likely due to the probable duodenitis/GI findings on CT imaging above. Blood pressure remained stable. PCCM asked to see when pt seemed to develop worsening work of breathing on 10/27  Now off BiPAP, seems as though breathing issues directly related to abdominal pain.  She has had marginal drop in her white blood cell count.  Spoke to primary service who will reach out to family.  Also will go ahead and get imaging of abdomen/pelvis to be sure were not missing abscess or other pathology to explain pain.  For now recommendations are continue antibiotics, continue IV hydration, CT abdomen pelvis, continue supportive care.  She is a DO NOT RESUSCITATE.  We can continue BiPAP as needed for work of breathing.  We will be available as needed   Simonne Martinet ACNP-BC [facility] Pager # [phone] OR # [phone] if no answer  10:47 AM 07/21/17

## 2017-07-22 DIAGNOSIS — D62 Acute posthemorrhagic anemia: Secondary | ICD-10-CM

## 2017-07-22 DIAGNOSIS — A419 Sepsis, unspecified organism: Principal | ICD-10-CM

## 2017-07-22 DIAGNOSIS — F329 Major depressive disorder, single episode, unspecified: Secondary | ICD-10-CM

## 2017-07-22 DIAGNOSIS — R6521 Severe sepsis with septic shock: Secondary | ICD-10-CM

## 2017-07-22 LAB — BASIC METABOLIC PANEL
Anion gap: 11 (ref 5–15)
BUN: 15 mg/dL (ref 6–20)
CALCIUM: 8.1 mg/dL — AB (ref 8.9–10.3)
CO2: 21 mmol/L — ABNORMAL LOW (ref 22–32)
CREATININE: 0.83 mg/dL (ref 0.44–1.00)
Chloride: 105 mmol/L (ref 101–111)
GFR calc Af Amer: >60 mL/min (ref >60)
GLUCOSE: 85 mg/dL (ref 65–99)
POTASSIUM: 3.9 mmol/L (ref 3.5–5.1)
SODIUM: 137 mmol/L (ref 135–145)

## 2017-07-22 LAB — CBC
HCT: 30.7 % — ABNORMAL LOW (ref 36.0–46.0)
Hemoglobin: 9.9 g/dL — ABNORMAL LOW (ref 12.0–15.0)
MCH: 32.1 pg (ref 26.0–34.0)
MCHC: 32.2 g/dL (ref 30.0–36.0)
MCV: 99.7 fL (ref 78.0–100.0)
PLATELETS: 441 10*3/uL — AB (ref 150–400)
RBC: 3.08 MIL/uL — AB (ref 3.87–5.11)
RDW: 17.1 % — AB (ref 11.5–15.5)
WBC: 20.9 10*3/uL — ABNORMAL HIGH (ref 4.0–10.5)

## 2017-07-22 LAB — MAGNESIUM: MAGNESIUM: 2.2 mg/dL (ref 1.7–2.4)

## 2017-07-22 MED ORDER — HALOPERIDOL LACTATE 5 MG/ML IJ SOLN
1.0000 mg | Freq: Once | INTRAMUSCULAR | Status: AC
  Administered 2017-07-22: 1 mg via INTRAVENOUS
  Filled 2017-07-22: qty 1

## 2017-07-22 MED ORDER — ZOLPIDEM TARTRATE 5 MG PO TABS
5.0000 mg | ORAL_TABLET | Freq: Every evening | ORAL | Status: DC | PRN
  Administered 2017-07-23 – 2017-07-26 (×5): 5 mg via ORAL
  Filled 2017-07-22 (×5): qty 1

## 2017-07-22 NOTE — Progress Notes (Signed)
Danielle [name] 1:54 PM  Subjective: Patient's only complaint is wanting something to drink and denies abdominal pain and no signs of bleeding  Objective: Vital signs stable afebrile increased respiratory rate today a little more conversant compared to yesterday abdomen is soft nontender white count slightly decreased hemoglobin stable CT improved  Assessment: Multiple medical problems including respiratory failure  Plan: Will allow sips of clear liquids consider bedside swallowing evaluation needed but nurse says she swallowed pills without difficulty and may slowly advance diet as tolerated and please call us if we can be of any further assistance with this hospital stay  [facility],Danielle [name] E  Pager [phone] After 5PM or if no answer call [phone]

## 2017-07-22 NOTE — Progress Notes (Signed)
[facility] PROGRESS NOTE  Danielle [name] ZOX:[id number] DOB: 09-Sep-1946 DOA: 07/17/2017 PCP: Patient, No Pcp Per  Interim summary and HPI Pt is 71 yo female with known hx of CVA, intra cranial hemorrhage, resides in SNF, presented to [facility] ED for evaluation of several days duration of epigastric abd pain, weakness and poor oral intake, watery diarrhea and non bloody vomiting. BP at the facility was 70/52 and she was sent to ED.  In ED, pt was tachycardic with HR up to 110's, BP 97/54, K 3.1, Cr 1.7, lipase 22, WBC 18.9. CT renal stone study notable for possible duodenitis vs pancreatitis, CXR unremarkable. Pt started on IV Vanc, Zosyn, empiric oral vanc for presumptive C. Diff and also started on IVF. Patient declining invasive intervention. Continue supportive care, IV antibiotics and follow clinical response.  Assessment/Plan: 1-Severe sepsis/septic shock -Appears to be secondary to duodenitis and colitis -Per GI recommendations no endoscopic interventions are planned -will continue current IV antibiotics and supportive care. -Slowly advancing diet (starting with clear liquid diet) -Repeat CT scan of her abdomen demonstrated some improvement in her ongoing intestinal inflammation. -WBC's trending down  2-acute blood loss anemia/bloody stools -Status post 1 unit of blood transfusion on 10/29 with appropriate posttransfusion response. -Current hemoglobin level 9.9 -Will continue PPI -Patient not interested in any endoscopic evaluation -No further bloody stool appreciated -Continue to have some discomfort in her abdomen (especially on palpation). -Will follow hemoglobin trend intermittently.  3-sinus tachycardia -Orbital improved -Associated with sepsis and anemia -Will use as needed beta-blocker  4-acute kidney injury -Secondary to prerenal azotemia and hypotension with decreased perfusion in the setting of sepsis. -Improved/resolved with IV fluids -Will follow basic  metabolic panel intermittently  5-abdominal pain/diarrhea/ileus -Due to duodenitis and sigmoiditis -C. difficile negative -Continue current antibiotics and supportive care -Repeat CT scan done on 10/30 first with some improvement in her intestinal inflammatory response. -Per GI recommendations and following patient's wishes no endoscopic intervention is planned. -Slowly advance diet and follow clinical response.  6-acute respiratory failure with hypoxia -Appears to be secondary to decrease in lung expansion from abd distension and overall tachypnea from sepsis. -will continue BD's -no need for BIPAP in the last 24 hours -will continue supportive care -ok to transfer to the floor.  7-acute metabolic and toxic encephalopathy  -improved -following commands and in on major distress. -will advance diet to CLD -will transfer to med-surg  8-history of depression/anxiety -No suicidal ideation or hallucinations -Continue Effexor and continue Valium.  9-hypernatremia -Resolved with use of D5 1/2 NS -will monitor electrolytes intermittently.  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   Code Status: DNR Family Communication: No family at bedside Disposition Plan: Continue IV antibiotics, slowly advance diet and follow clinical response.  Patient to be transferred to MedSurg bed.   Consultants:  GI  PCCM  Procedures:  See below for x-ray reports  Antibiotics:  Zosyn 10/26 -->  Vancomycin 10/26 --> 10/29   Oral Vancomycin 10/26 --> 10/27  HPI/Subjective: Alert, awake and oriented x2; able to follow commands and overall reporting to feel better.  No further episodes of bloody stools, continue to have abdominal pain.  Patient denies chest pain and has no required BiPAP for the last 24 hours.  Breathing is stable with good oxygen saturation on room air.  Objective: Vitals:    07/22/17 1600 07/22/17 1700  BP: (!) 133/48   Pulse: (!) 120 (!)  117  Resp: (!) 26 (!) 35  Temp: 98.2 F (36.8 C)   SpO2: 99% 100%    Intake/Output Summary (Last 24 hours) at 07/22/17 1811 Last data filed at 07/22/17 1600  Gross per 24 hour  Intake              990 ml  Output             1300 ml  Net             -310 ml   Filed Weights   07/18/17 1500 07/19/17 2200 07/21/17 0500  Weight: 77.7 kg (171 lb 4.8 oz) 80.9 kg (178 lb 5.6 oz) 81 kg (178 lb 9.2 oz)    Exam:   General: No chest pain, alert, awake and oriented x2 able to follow commands and in no major distress.  Patient is still with some tachypnea and abdominal pain.  Has not required BiPAP and is protecting airways/having normal oxygen saturation on room air.  Cardiovascular: Sinus tachycardia, no rubs, no gallops, no JVD appreciated.  Respiratory: Positive tachypnea, no wheezing, scattered rhonchi appreciated throughout both lung fields, no frank crackles.  Abdomen: Soft, mild distention, positive tenderness to palpation (right mid section and left lower quadrant); positive bowel sounds no guarding.  Musculoskeletal: No edema, no cyanosis, no clubbing.  Neurologic exam: No focal deficit appreciated, cranial nerve grossly intact.  Data Reviewed: Basic Metabolic Panel:  Recent Labs Lab 07/17/17 2144  07/18/17 1407 07/19/17 0048 07/20/17 0339 07/21/17 0315 07/22/17 0325  NA  --   < > 139 150* 140 139 137  K  --   < > 3.7 4.0 3.4* 3.3* 3.9  CL  --   < > 112* 120* 113* 110 105  CO2  --   < > 17* 14* 18* 20* 21*  GLUCOSE  --   < > 102* 78 102* 99 85  BUN  --   < > 13 16 22* 20 15  CREATININE  --   < > 1.43* 1.51* 1.26* 0.96 0.83  CALCIUM  --   < > 7.6* 8.2* 7.6* 7.9* 8.1*  MG 2.0  --   --  1.8 1.7 2.5* 2.2  PHOS  --   --   --  3.7  --   --   --   < > = values in this interval not displayed. Liver Function Tests:  Recent Labs Lab 07/17/17 2142 07/19/17 0048  AST 31  --   ALT 13*  --   ALKPHOS 170*   --   BILITOT 0.8  --   PROT 6.1*  --   ALBUMIN 2.8* 2.6*    Recent Labs Lab 07/17/17 2144  LIPASE 22   CBC:  Recent Labs Lab 07/17/17 2142  07/19/17 0739 07/19/17 1144 07/20/17 0339 07/21/17 0315 07/22/17 0325  WBC 18.9*  < > 30.5* 31.6* 28.1* 23.3* 20.9*  NEUTROABS 17.6*  --   --   --   --   --   --   HGB 9.6*  < > 8.4* 8.6* 7.7* 9.3* 9.9*  HCT 30.3*  < > 26.3* 26.7* 23.9* 29.4* 30.7*  MCV 103.8*  < > 104.0* 103.9* 103.5* 99.3 99.7  PLT 603*  < > 446* 461* 393 409* 441*  < > = values in this interval not displayed.  BNP (last 3 results)  Recent Labs  07/17/17 2142  BNP 174.6*   CBG:  Recent Labs Lab 07/18/17 1342  GLUCAP 71  Recent Results (from the past 240 hour(s))  Blood Culture (routine x 2)     Status: None (Preliminary result)   Collection Time: 07/17/17  9:50 PM  Result Value Ref Range Status   Specimen Description BLOOD LEFT WRIST  Final   Special Requests IN PEDIATRIC BOTTLE Blood Culture adequate volume  Final   Culture   Final    NO GROWTH 4 DAYS Performed at [facility], [address] [place], [place] [address]    Report Status PENDING  Incomplete  Blood Culture (routine x 2)     Status: None (Preliminary result)   Collection Time: 07/17/17  9:54 PM  Result Value Ref Range Status   Specimen Description BLOOD LEFT ANTECUBITAL  Final   Special Requests   Final    BOTTLES DRAWN AEROBIC AND ANAEROBIC Blood Culture adequate volume   Culture   Final    NO GROWTH 4 DAYS Performed at [facility], [address], [place], [place] [address]    Report Status PENDING  Incomplete  C difficile quick scan w PCR reflex     Status: None   Collection Time: 07/18/17 12:52 AM  Result Value Ref Range Status   C Diff antigen NEGATIVE NEGATIVE Final   C Diff toxin NEGATIVE NEGATIVE Final   C Diff interpretation No C. difficile detected.  Final  MRSA PCR Screening     Status: None   Collection Time: 07/18/17  6:34 AM  Result Value Ref  Range Status   MRSA by PCR NEGATIVE NEGATIVE Final    Comment:        The GeneXpert MRSA Assay (FDA approved for NASAL specimens only), is one component of a comprehensive MRSA colonization surveillance program. It is not intended to diagnose MRSA infection nor to guide or monitor treatment for MRSA infections.   Gastrointestinal Panel by PCR , Stool     Status: None   Collection Time: 07/18/17  1:32 PM  Result Value Ref Range Status   Campylobacter species NOT DETECTED NOT DETECTED Final   Plesimonas shigelloides NOT DETECTED NOT DETECTED Final   Salmonella species NOT DETECTED NOT DETECTED Final   Yersinia enterocolitica NOT DETECTED NOT DETECTED Final   Vibrio species NOT DETECTED NOT DETECTED Final   Vibrio cholerae NOT DETECTED NOT DETECTED Final   Enteroaggregative E coli (EAEC) NOT DETECTED NOT DETECTED Final   Enteropathogenic E coli (EPEC) NOT DETECTED NOT DETECTED Final   Enterotoxigenic E coli (ETEC) NOT DETECTED NOT DETECTED Final   Shiga like toxin producing E coli (STEC) NOT DETECTED NOT DETECTED Final   Shigella/Enteroinvasive E coli (EIEC) NOT DETECTED NOT DETECTED Final   Cryptosporidium NOT DETECTED NOT DETECTED Final   Cyclospora cayetanensis NOT DETECTED NOT DETECTED Final   Entamoeba histolytica NOT DETECTED NOT DETECTED Final   Giardia lamblia NOT DETECTED NOT DETECTED Final   Adenovirus F40/41 NOT DETECTED NOT DETECTED Final   Astrovirus NOT DETECTED NOT DETECTED Final   Norovirus GI/GII NOT DETECTED NOT DETECTED Final   Rotavirus A NOT DETECTED NOT DETECTED Final   Sapovirus (I, II, IV, and V) NOT DETECTED NOT DETECTED Final     Studies: Ct Abdomen Pelvis W Contrast  Result Date: 07/21/2017 CLINICAL DATA:  Abdominal pain, unspecified. Patient admitted with sepsis and bloody stool. EXAM: CT ABDOMEN AND PELVIS WITH CONTRAST TECHNIQUE: Multidetector CT imaging of the abdomen and pelvis was performed using the standard protocol following bolus  administration of intravenous contrast. CONTRAST:  100 cc Isovue-300  IV COMPARISON:  Most recent CT 4 days prior 07/17/2017 FINDINGS: Lower chest: Calcified granuloma in the left lower lobe. Remote right rib fractures. Scattered linear atelectasis. No pleural fluid. Coronary artery calcifications are incidentally noted. Hepatobiliary: Mild breathing motion artifact through the liver are. Allowing for this, no focal hepatic lesion. Gallbladder physiologically distended, no calcified stone. No biliary dilatation. Pancreas: Breathing motion artifact through the pancreatic head in the region of peripancreatic stranding limiting assessment. No definite progression from prior. No pancreatic ductal dilatation. Spleen: Normal in size without focal abnormality. Adrenals/Urinary Tract: No adrenal nodule. Motion artifact through the kidneys. No hydronephrosis. Symmetric excretion on delayed phase imaging. Urinary bladder is decompressed by Foley catheter and not well evaluated. Stomach/Bowel: Small hiatal hernia. Stomach distended with enteric contrast, no gastric wall thickening. Breathing motion artifact through the and duodenum in the region of previous stranding, no definite progression, there is likely some improvement from prior. Limited enteric contrast is only reached the proximal jejunum. Proximal small bowel diffusely fluid-filled and prominent caliber measuring 3.1 cm. No abrupt transition, gradual transition to decompressed distal small bowel noted in the right lower abdomen. Limited assessment of the more distal small bowel due to motion and nondistention. There is mild mesenteric edema in the central mesentery. Decreased stool burden from prior exam. Previous sigmoid colonic wall thickening has improved, wall thickening of the distal sigmoid persists. Mild persistent pericolonic edema about the distal sigmoid. Vascular/Lymphatic: Dense aortic atherosclerosis. Normal caliber IVC. Few prominent inguinal nodes are  likely reactive. Similar lymph nodes adjacent to the gastroesophageal junction. Reproductive: Status post hysterectomy. No adnexal masses. Other: Small amount of free fluid in the pelvis and right lower abdomen. Increased body wall edema in the flanks. No free air or intra-abdominal abscess. Musculoskeletal: Chronic T9 compression fracture. Multilevel degenerative change throughout spine. No new osseous abnormality. IMPRESSION: 1. Improvement in pericolonic edema about the sigmoid colon from recent exam, with residual distal sigmoid wall thickening and soft tissue stranding. 2. Likely improved soft tissue stranding about the duodenum and head of the pancreas, region partially obscured by motion. 3. Progressive fluid-filled small bowel distention, ileus or enteritis are considered as potential etiologies. Small amount of free fluid in the pelvis and right lower abdomen, likely reactive. 4. Exam is otherwise unchanged. Aortic and branch atherosclerosis, moderate to advanced. Aortic Atherosclerosis (ICD10-I70.0). Electronically Signed   By: Rubye OaksMelanie  Ehinger M.D.   On: 07/21/2017 21:37    Scheduled Meds: . diazepam  2 mg Oral TID  . mometasone-formoterol  2 puff Inhalation BID  . nicotine  21 mg Transdermal Daily  . pantoprazole  40 mg Intravenous Q12H  . venlafaxine  75 mg Oral BID   Continuous Infusions: . dextrose 30 mL/hr at 07/22/17 1600  . magnesium sulfate 1 - 4 g bolus IVPB    . piperacillin-tazobactam (ZOSYN)  IV Stopped (07/22/17 1705)    Principal Problem:   AKI (acute kidney injury) (HCC) Active Problems:   Abdominal pain   Colitis   COPD (chronic obstructive pulmonary disease) (HCC)   Acute respiratory failure with hypoxia (HCC)    Time spent: 30 minutes    Vassie LollMadera, Winifred Balogh  [facility] Pager [phone]. If 7PM-7AM, please contact night-coverage at [email or web address], password [email or web address] 07/22/2017, 6:11 PM  LOS: 4 days

## 2017-07-23 DIAGNOSIS — R109 Unspecified abdominal pain: Secondary | ICD-10-CM

## 2017-07-23 DIAGNOSIS — J449 Chronic obstructive pulmonary disease, unspecified: Secondary | ICD-10-CM

## 2017-07-23 LAB — BASIC METABOLIC PANEL
Anion gap: 9 (ref 5–15)
BUN: 7 mg/dL (ref 6–20)
CALCIUM: 7.6 mg/dL — AB (ref 8.9–10.3)
CO2: 22 mmol/L (ref 22–32)
CREATININE: 0.61 mg/dL (ref 0.44–1.00)
Chloride: 104 mmol/L (ref 101–111)
GFR calc Af Amer: >60 mL/min (ref >60)
Glucose, Bld: 115 mg/dL — ABNORMAL HIGH (ref 65–99)
POTASSIUM: 3 mmol/L — AB (ref 3.5–5.1)
SODIUM: 135 mmol/L (ref 135–145)

## 2017-07-23 LAB — BLOOD GAS, ARTERIAL
ACID-BASE DEFICIT: 2.5 mmol/L — AB (ref 0.0–2.0)
Bicarbonate: 20.5 mmol/L (ref 20.0–28.0)
DRAWN BY: 225631
FIO2: 21
O2 SAT: 95.2 %
PCO2 ART: 29.3 mmHg — AB (ref 32.0–48.0)
Patient temperature: 97.8
RATE: 32 resp/min
pH, Arterial: 7.456 — ABNORMAL HIGH (ref 7.350–7.450)
pO2, Arterial: 69.9 mmHg — ABNORMAL LOW (ref 83.0–108.0)

## 2017-07-23 LAB — CBC
HCT: 25.8 % — ABNORMAL LOW (ref 36.0–46.0)
Hemoglobin: 8.3 g/dL — ABNORMAL LOW (ref 12.0–15.0)
MCH: 31.8 pg (ref 26.0–34.0)
MCHC: 32.2 g/dL (ref 30.0–36.0)
MCV: 98.9 fL (ref 78.0–100.0)
PLATELETS: 451 10*3/uL — AB (ref 150–400)
RBC: 2.61 MIL/uL — AB (ref 3.87–5.11)
RDW: 16.3 % — ABNORMAL HIGH (ref 11.5–15.5)
WBC: 15.7 10*3/uL — ABNORMAL HIGH (ref 4.0–10.5)

## 2017-07-23 LAB — CULTURE, BLOOD (ROUTINE X 2)
CULTURE: NO GROWTH
Culture: NO GROWTH
SPECIAL REQUESTS: ADEQUATE
SPECIAL REQUESTS: ADEQUATE

## 2017-07-23 MED ORDER — POTASSIUM CHLORIDE CRYS ER 20 MEQ PO TBCR
40.0000 meq | EXTENDED_RELEASE_TABLET | ORAL | Status: AC
  Administered 2017-07-23 – 2017-07-24 (×3): 40 meq via ORAL
  Filled 2017-07-23 (×3): qty 2

## 2017-07-23 MED ORDER — LEVALBUTEROL HCL 0.63 MG/3ML IN NEBU
0.6300 mg | INHALATION_SOLUTION | Freq: Three times a day (TID) | RESPIRATORY_TRACT | Status: DC
  Administered 2017-07-24 – 2017-07-27 (×10): 0.63 mg via RESPIRATORY_TRACT
  Filled 2017-07-23 (×12): qty 3

## 2017-07-23 MED ORDER — METHYLPREDNISOLONE SODIUM SUCC 40 MG IJ SOLR
40.0000 mg | Freq: Two times a day (BID) | INTRAMUSCULAR | Status: DC
  Administered 2017-07-23 – 2017-07-24 (×3): 40 mg via INTRAVENOUS
  Filled 2017-07-23 (×4): qty 1

## 2017-07-23 MED ORDER — BUDESONIDE 0.5 MG/2ML IN SUSP
0.5000 mg | Freq: Two times a day (BID) | RESPIRATORY_TRACT | Status: DC
  Administered 2017-07-23 – 2017-07-27 (×8): 0.5 mg via RESPIRATORY_TRACT
  Filled 2017-07-23 (×9): qty 2

## 2017-07-23 NOTE — Progress Notes (Signed)
Date:  July 23, 2017 Chart reviewed for concurrent status and case management needs.  Will continue to follow patient progress.  Discharge Planning: following for needs  Expected discharge date: [id number]  Arik Husmann, BSN, RN3, CCM   [phone]  

## 2017-07-23 NOTE — Progress Notes (Signed)
[facility] PROGRESS NOTE  Danielle [name] WUJ:[id number] DOB: 1945-11-27 DOA: 07/17/2017 PCP: Patient, No Pcp Per  Interim summary and HPI Pt is 71 yo female with known hx of CVA, intra cranial hemorrhage, resides in SNF, presented to [facility] ED for evaluation of several days duration of epigastric abd pain, weakness and poor oral intake, watery diarrhea and non bloody vomiting. BP at the facility was 70/52 and she was sent to ED.  In ED, pt was tachycardic with HR up to 110's, BP 97/54, K 3.1, Cr 1.7, lipase 22, WBC 18.9. CT renal stone study notable for possible duodenitis vs pancreatitis, CXR unremarkable. Pt started on IV Vanc, Zosyn, empiric oral vanc for presumptive C. Diff and also started on IVF. Patient declining invasive intervention. Continue supportive care, IV antibiotics and follow clinical response.  Assessment/Plan: 1-Severe sepsis/septic shock -Appears to be secondary to duodenitis and colitis -Per GI recommendations no endoscopic interventions are planned -will continue current IV antibiotics and supportive care. -Slowly advancing diet (starting with clear liquid diet and move to full liquid) -Repeat CT scan of her abdomen demonstrated some improvement in her ongoing intestinal inflammation. -WBC's trending down, but now that started on solumedrol we can see increase in her WBC's from steroids.   2-acute blood loss anemia/bloody stools -Status post 1 unit of blood transfusion on 10/29 with appropriate posttransfusion response. -Current hemoglobin has remains stable and no signs of acute bleeding -will monitor Hgb trend  -continue PPI -Patient not interested in any endoscopic evaluation -Continue to have some discomfort in her abdomen (especially on palpation). -Will follow hemoglobin trend intermittently.  3-sinus tachycardia -Overalll improved -Associated with sepsis and anemia -Will continue use as needed beta-blocker  4-acute kidney injury -Secondary to  prerenal azotemia and hypotension with decreased perfusion in the setting of sepsis. -Improved/resolved with IV fluids -Will follow basic metabolic panel intermittently  5-abdominal pain/diarrhea/ileus -Due to duodenitis and sigmoiditis -C. difficile tested and negative -Continue current antibiotics and supportive care -Repeated CT scan done on 10/30 first with some improvement in her intestinal inflammatory response. -Per GI recommendations and following patient's wishes no endoscopic intervention is planned. -Slowly advance diet and follow clinical response.  6-acute respiratory failure with hypoxia/COPD exacerbation -Appears to be secondary to decrease in lung expansion from abd distension and overall tachypnea from sepsis and COPD exacerbation. -will continue BD's, start pulmicort and use solumedrol BID -no need for BIPAP in the last 48 hours -will continue supportive care -ok to transfer to the floor. -will continue oxygen supplementation.   7-acute metabolic and toxic encephalopathy  -improved/close to baseline  -following commands and in on major distress. -will advance diet to full liquid -will monitor clinical response   8-history of depression/anxiety -No suicidal ideation or hallucinations -Continue Effexor and continue Valium.  9-hypernatremia -Resolved with use of D5 saline -will monitor electrolytes intermittently. 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   Code Status: DNR Family Communication: No family at bedside Disposition Plan: Continue IV antibiotics, slowly advance diet and follow clinical response.  Patient to be transferred to MedSurg bed.   Consultants:  GI  PCCM  Procedures:  See below for x-ray reports  Antibiotics:  Zosyn 10/26 -->  Vancomycin 10/26 --> 10/29   Oral Vancomycin  10/26 --> 10/27  HPI/Subjective: AAOX2, no CP, no SOB, no nausea or vomiting. Still with abd pain and having BM/passing  gas.  Objective: Vitals:   07/23/17 0800 07/23/17 1409  BP: (!) 148/69 135/75  Pulse: (!) 110 (!) 113  Resp: (!) 31 (!) 32  Temp: 98.2 F (36.8 C) 98.4 F (36.9 C)  SpO2: 98% 100%    Intake/Output Summary (Last 24 hours) at 07/23/17 1503 Last data filed at 07/23/17 0900  Gross per 24 hour  Intake              860 ml  Output              750 ml  Net              110 ml   Filed Weights   07/21/17 0500 07/23/17 0500 07/23/17 1409  Weight: 81 kg (178 lb 9.2 oz) 79.6 kg (175 lb 7.8 oz) 82.9 kg (182 lb 12.8 oz)    Exam:   General: feeling better overall. No CP, no nausea, no vomiting. Patient with positive abd pain, oriented X2 and able to follow commands. Reports ongoing BM's and passing gas. Hungry and able to tolerate CLD. Patient with increase wheezing and having some SOB.   Cardiovascular: sinus tachycardia, no rubs, no gallops, no JVD.  Respiratory: some tachypnea appreciated (especially on exertion), positive scattered rhonchi and positive exp wheezing appreciated.   Abdomen: soft, tender to palpation; with positive distension. Positive BS appreciated.  Musculoskeletal: no edema, no cyanosis.   Data Reviewed: Basic Metabolic Panel:  Recent Labs Lab 07/17/17 2144  07/19/17 0048 07/20/17 0339 07/21/17 0315 07/22/17 0325 07/23/17 1352  NA  --   < > 150* 140 139 137 135  K  --   < > 4.0 3.4* 3.3* 3.9 3.0*  CL  --   < > 120* 113* 110 105 104  CO2  --   < > 14* 18* 20* 21* 22  GLUCOSE  --   < > 78 102* 99 85 115*  BUN  --   < > 16 22* 20 15 7   CREATININE  --   < > 1.51* 1.26* 0.96 0.83 0.61  CALCIUM  --   < > 8.2* 7.6* 7.9* 8.1* 7.6*  MG 2.0  --  1.8 1.7 2.5* 2.2  --   PHOS  --   --  3.7  --   --   --   --   < > = values in this interval not displayed. Liver Function Tests:  Recent Labs Lab 07/17/17 2142 07/19/17 0048  AST 31  --    ALT 13*  --   ALKPHOS 170*  --   BILITOT 0.8  --   PROT 6.1*  --   ALBUMIN 2.8* 2.6*    Recent Labs Lab 07/17/17 2144  LIPASE 22   CBC:  Recent Labs Lab 07/17/17 2142  07/19/17 1144 07/20/17 0339 07/21/17 0315 07/22/17 0325 07/23/17 1352  WBC 18.9*  < > 31.6* 28.1* 23.3* 20.9* 15.7*  NEUTROABS 17.6*  --   --   --   --   --   --   HGB 9.6*  < > 8.6* 7.7* 9.3* 9.9* 8.3*  HCT 30.3*  < > 26.7* 23.9* 29.4* 30.7* 25.8*  MCV 103.8*  < > 103.9* 103.5* 99.3 99.7 98.9  PLT 603*  < > 461* 393 409* 441* 451*  < > = values in this interval not displayed.  BNP (last 3 results)  Recent Labs  07/17/17 2142  BNP 174.6*   CBG:  Recent Labs Lab 07/18/17 1342  GLUCAP  71    Recent Results (from the past 240 hour(s))  Blood Culture (routine x 2)     Status: None (Preliminary result)   Collection Time: 07/17/17  9:50 PM  Result Value Ref Range Status   Specimen Description BLOOD LEFT WRIST  Final   Special Requests IN PEDIATRIC BOTTLE Blood Culture adequate volume  Final   Culture   Final    NO GROWTH 4 DAYS Performed at [facility], [address], [place], [place] [address]    Report Status PENDING  Incomplete  Blood Culture (routine x 2)     Status: None (Preliminary result)   Collection Time: 07/17/17  9:54 PM  Result Value Ref Range Status   Specimen Description BLOOD LEFT ANTECUBITAL  Final   Special Requests   Final    BOTTLES DRAWN AEROBIC AND ANAEROBIC Blood Culture adequate volume   Culture   Final    NO GROWTH 4 DAYS Performed at [facility], [address] [place], [place] [address]    Report Status PENDING  Incomplete  C difficile quick scan w PCR reflex     Status: None   Collection Time: 07/18/17 12:52 AM  Result Value Ref Range Status   C Diff antigen NEGATIVE NEGATIVE Final   C Diff toxin NEGATIVE NEGATIVE Final   C Diff interpretation No C. difficile detected.  Final  MRSA PCR Screening     Status: None   Collection Time: 07/18/17   6:34 AM  Result Value Ref Range Status   MRSA by PCR NEGATIVE NEGATIVE Final    Comment:        The GeneXpert MRSA Assay (FDA approved for NASAL specimens only), is one component of a comprehensive MRSA colonization surveillance program. It is not intended to diagnose MRSA infection nor to guide or monitor treatment for MRSA infections.   Gastrointestinal Panel by PCR , Stool     Status: None   Collection Time: 07/18/17  1:32 PM  Result Value Ref Range Status   Campylobacter species NOT DETECTED NOT DETECTED Final   Plesimonas shigelloides NOT DETECTED NOT DETECTED Final   Salmonella species NOT DETECTED NOT DETECTED Final   Yersinia enterocolitica NOT DETECTED NOT DETECTED Final   Vibrio species NOT DETECTED NOT DETECTED Final   Vibrio cholerae NOT DETECTED NOT DETECTED Final   Enteroaggregative E coli (EAEC) NOT DETECTED NOT DETECTED Final   Enteropathogenic E coli (EPEC) NOT DETECTED NOT DETECTED Final   Enterotoxigenic E coli (ETEC) NOT DETECTED NOT DETECTED Final   Shiga like toxin producing E coli (STEC) NOT DETECTED NOT DETECTED Final   Shigella/Enteroinvasive E coli (EIEC) NOT DETECTED NOT DETECTED Final   Cryptosporidium NOT DETECTED NOT DETECTED Final   Cyclospora cayetanensis NOT DETECTED NOT DETECTED Final   Entamoeba histolytica NOT DETECTED NOT DETECTED Final   Giardia lamblia NOT DETECTED NOT DETECTED Final   Adenovirus F40/41 NOT DETECTED NOT DETECTED Final   Astrovirus NOT DETECTED NOT DETECTED Final   Norovirus GI/GII NOT DETECTED NOT DETECTED Final   Rotavirus A NOT DETECTED NOT DETECTED Final   Sapovirus (I, II, IV, and V) NOT DETECTED NOT DETECTED Final     Studies: Ct Abdomen Pelvis W Contrast  Result Date: 07/21/2017 CLINICAL DATA:  Abdominal pain, unspecified. Patient admitted with sepsis and bloody stool. EXAM: CT ABDOMEN AND PELVIS WITH CONTRAST TECHNIQUE: Multidetector CT imaging of the abdomen and pelvis was performed using the standard protocol  following bolus administration of intravenous contrast. CONTRAST:  100 cc Isovue-300 IV COMPARISON:  Most recent CT 4 days prior 07/17/2017 FINDINGS: Lower chest: Calcified granuloma in the left lower lobe. Remote right rib fractures. Scattered linear atelectasis. No pleural fluid. Coronary artery calcifications are incidentally noted. Hepatobiliary: Mild breathing motion artifact through the liver are. Allowing for this, no focal hepatic lesion. Gallbladder physiologically distended, no calcified stone. No biliary dilatation. Pancreas: Breathing motion artifact through the pancreatic head in the region of peripancreatic stranding limiting assessment. No definite progression from prior. No pancreatic ductal dilatation. Spleen: Normal in size without focal abnormality. Adrenals/Urinary Tract: No adrenal nodule. Motion artifact through the kidneys. No hydronephrosis. Symmetric excretion on delayed phase imaging. Urinary bladder is decompressed by Foley catheter and not well evaluated. Stomach/Bowel: Small hiatal hernia. Stomach distended with enteric contrast, no gastric wall thickening. Breathing motion artifact through the and duodenum in the region of previous stranding, no definite progression, there is likely some improvement from prior. Limited enteric contrast is only reached the proximal jejunum. Proximal small bowel diffusely fluid-filled and prominent caliber measuring 3.1 cm. No abrupt transition, gradual transition to decompressed distal small bowel noted in the right lower abdomen. Limited assessment of the more distal small bowel due to motion and nondistention. There is mild mesenteric edema in the central mesentery. Decreased stool burden from prior exam. Previous sigmoid colonic wall thickening has improved, wall thickening of the distal sigmoid persists. Mild persistent pericolonic edema about the distal sigmoid. Vascular/Lymphatic: Dense aortic atherosclerosis. Normal caliber IVC. Few prominent  inguinal nodes are likely reactive. Similar lymph nodes adjacent to the gastroesophageal junction. Reproductive: Status post hysterectomy. No adnexal masses. Other: Small amount of free fluid in the pelvis and right lower abdomen. Increased body wall edema in the flanks. No free air or intra-abdominal abscess. Musculoskeletal: Chronic T9 compression fracture. Multilevel degenerative change throughout spine. No new osseous abnormality. IMPRESSION: 1. Improvement in pericolonic edema about the sigmoid colon from recent exam, with residual distal sigmoid wall thickening and soft tissue stranding. 2. Likely improved soft tissue stranding about the duodenum and head of the pancreas, region partially obscured by motion. 3. Progressive fluid-filled small bowel distention, ileus or enteritis are considered as potential etiologies. Small amount of free fluid in the pelvis and right lower abdomen, likely reactive. 4. Exam is otherwise unchanged. Aortic and branch atherosclerosis, moderate to advanced. Aortic Atherosclerosis (ICD10-I70.0). Electronically Signed   By: Rubye OaksMelanie  Ehinger M.D.   On: 07/21/2017 21:37    Scheduled Meds: . budesonide (PULMICORT) nebulizer solution  0.5 mg Nebulization BID  . diazepam  2 mg Oral TID  . methylPREDNISolone (SOLU-MEDROL) injection  40 mg Intravenous Q12H  . nicotine  21 mg Transdermal Daily  . pantoprazole  40 mg Intravenous Q12H  . potassium chloride  40 mEq Oral Q4H  . venlafaxine  75 mg Oral BID   Continuous Infusions: . dextrose 30 mL/hr at 07/23/17 1233  . magnesium sulfate 1 - 4 g bolus IVPB    . piperacillin-tazobactam (ZOSYN)  IV 3.375 g (07/23/17 1420)    Principal Problem:   AKI (acute kidney injury) (HCC) Active Problems:   Abdominal pain   Colitis   COPD (chronic obstructive pulmonary disease) (HCC)   Acute respiratory failure with hypoxia (HCC)    Time spent: 30 minutes    Vassie LollMadera, Mistie Adney  [facility] Pager [phone]. If 7PM-7AM, please  contact night-coverage at [email or web address], password [email or web address] 07/23/2017, 3:03 PM  LOS: 5 days

## 2017-07-23 NOTE — Evaluation (Addendum)
Clinical/Bedside Swallow Evaluation Patient Details  Name: Finis BudVickie Somera MRN: [id number] Date of Birth: 1946-02-02  Today's Date: 07/23/2017 Time: SLP Start Time (ACUTE ONLY): [id number] SLP Stop Time (ACUTE ONLY): 1000 SLP Time Calculation (min) (ACUTE ONLY): 21 min  Past Medical History:  Past Medical History:  Diagnosis Date  . Anemia   . Anxiety   . Arthritis    "some; all over" (05/29/2014)  . Asthma   . Chronic airway obstruction (HCC)   . COPD (chronic obstructive pulmonary disease) (HCC)   . Dementia   . Depression   . Encephalopathy   . Esophagitis   . Essential hypertension   . GERD (gastroesophageal reflux disease)   . GIB (gastrointestinal bleeding)   . HLD (hyperlipidemia)   . Hypopotassemia   . Hyposmolality and/or hyponatremia   . Insomnia   . Tobacco abuse    Past Surgical History:  Past Surgical History:  Procedure Laterality Date  . ABDOMINAL HYSTERECTOMY    . APPENDECTOMY    . CHOLECYSTECTOMY    . TONSILLECTOMY    . TUBAL LIGATION     HPI:  71 [facility], decreased po intake, watery diarrhea and emesis.  Found to have AKI - severe sepsis/shock due to suspected treating duodenitis.  Pt KUB showed bowel gas.  Pt PMH + for COPD, GIB, esophagitis, encephalopathy, HLD, dementia, CVA, and obesity.  Pt is not to be scoped per pt report to GI.  Swallow evaluation ordered, pt started on sips of clears and to advance as tolerated.  Pt chest imaging negative for CHF infiltrate.     Assessment / Plan / Recommendation Clinical Impression  Pt today with dyspnea abdominal breathing with respiratory rate of 40.  Congested cough that was not productive observed.  Pt disoriented to location, denies pain nor dyspnea.   She is grossly weak and is observed to have multiple respiratory cycles with small single boluses held in mouth.  Presumed premature spill of jello bolus into airway before swallow prompting weak cough.  Pt with multiple respiratory cycles  with oral bolus contained increasing aspiration risk.    She did not demonstrate overt symptoms of  aspiration with single small ice chips and applesauce.  She is at high aspiration risk at this time due to her lethargy, dyspnea and gross weakness.  Anticipate improvement as respiratory status improves.   Recommend npo except medications and ice chips   SLP Visit Diagnosis: Dysphagia, oropharyngeal phase (R13.12)    Aspiration Risk  Severe aspiration risk;Risk for inadequate nutrition/hydration    Diet Recommendation Ice chips PRN after oral care;NPO (medicine with puree)   Liquid Administration via: Spoon Medication Administration: Whole meds with puree Postural Changes: Seated upright at 90 degrees    Other  Recommendations Oral Care Recommendations: Oral care BID   Follow up Recommendations        Frequency and Duration min 1 x/week  1 week       Prognosis Prognosis for Safe Diet Advancement: Fair      Swallow Study   General Date of Onset: 07/23/17 HPI: 71 [facility], decreased po intake, watery diarrhea and emesis.  Found to have AKI - severe sepsis/shock due to suspected treating duodenitis.  Pt KUB showed bowel gas.  Pt PMH + for COPD, GIB, esophagitis, encephalopathy, HLD, dementia, CVA, and obesity.  Pt is not to be scoped per pt report to GI.  Swallow evaluation ordered, pt started on sips of  clears and to advance as tolerated.  Pt chest imaging negative for CHF infiltrate.   Type of Study: Bedside Swallow Evaluation Diet Prior to this Study: Thin liquids (clears) Temperature Spikes Noted: No Respiratory Status: Nasal cannula (2 liters, pt was off oxygen upon entrance to room) History of Recent Intubation: No (pt needed bipap) Behavior/Cognition:  (tends to nod off during session) Oral Cavity Assessment: Erythema Oral Care Completed by SLP: Yes Oral Cavity - Dentition: Other (Comment) (poor dentition) Self-Feeding Abilities: Total  assist Patient Positioning: Upright in bed Baseline Vocal Quality: Low vocal intensity Volitional Cough: Weak;Congested Volitional Swallow: Unable to elicit    Oral/Motor/Sensory Function Overall Oral Motor/Sensory Function: Generalized oral weakness (pt did not follow commands for oral motor exam, significant weakness)   Ice Chips Ice chips: Within functional limits Presentation: Spoon   Thin Liquid Thin Liquid: Impaired Presentation: Straw Oral Phase Impairments: Reduced labial seal Pharyngeal  Phase Impairments: Cough - Delayed    Nectar Thick Nectar Thick Liquid: Not tested   Honey Thick Honey Thick Liquid: Not tested   Puree Puree: Impaired Presentation: Spoon Oral Phase Impairments: Reduced lingual movement/coordination Pharyngeal Phase Impairments: Suspected delayed Swallow;Cough - Delayed   Solid   GO   Solid: Not tested        Chales Abrahams 07/23/2017,10:15 AM   Donavan Burnet, MS [facility] SLP [phone]

## 2017-07-24 DIAGNOSIS — R1032 Left lower quadrant pain: Secondary | ICD-10-CM

## 2017-07-24 LAB — BASIC METABOLIC PANEL
Anion gap: 11 (ref 5–15)
BUN: 6 mg/dL (ref 6–20)
CHLORIDE: 103 mmol/L (ref 101–111)
CO2: 22 mmol/L (ref 22–32)
Calcium: 7.9 mg/dL — ABNORMAL LOW (ref 8.9–10.3)
Creatinine, Ser: 0.63 mg/dL (ref 0.44–1.00)
Glucose, Bld: 131 mg/dL — ABNORMAL HIGH (ref 65–99)
POTASSIUM: 3.3 mmol/L — AB (ref 3.5–5.1)
SODIUM: 136 mmol/L (ref 135–145)

## 2017-07-24 LAB — CBC
HCT: 26.8 % — ABNORMAL LOW (ref 36.0–46.0)
HEMOGLOBIN: 8.8 g/dL — AB (ref 12.0–15.0)
MCH: 32 pg (ref 26.0–34.0)
MCHC: 32.8 g/dL (ref 30.0–36.0)
MCV: 97.5 fL (ref 78.0–100.0)
PLATELETS: 452 10*3/uL — AB (ref 150–400)
RBC: 2.75 MIL/uL — AB (ref 3.87–5.11)
RDW: 16 % — ABNORMAL HIGH (ref 11.5–15.5)
WBC: 13.3 10*3/uL — AB (ref 4.0–10.5)

## 2017-07-24 MED ORDER — POTASSIUM CHLORIDE CRYS ER 20 MEQ PO TBCR
40.0000 meq | EXTENDED_RELEASE_TABLET | ORAL | Status: AC
  Administered 2017-07-24: 40 meq via ORAL
  Filled 2017-07-24: qty 2

## 2017-07-24 MED ORDER — AMOXICILLIN-POT CLAVULANATE 875-125 MG PO TABS
1.0000 | ORAL_TABLET | Freq: Two times a day (BID) | ORAL | Status: DC
  Administered 2017-07-24 – 2017-07-27 (×7): 1 via ORAL
  Filled 2017-07-24 (×7): qty 1

## 2017-07-24 MED ORDER — LIP MEDEX EX OINT
TOPICAL_OINTMENT | CUTANEOUS | Status: AC
  Administered 2017-07-24: 14:00:00
  Filled 2017-07-24: qty 7

## 2017-07-24 NOTE — Progress Notes (Signed)
Pt was restless and trying to get up the first part of the shift.  Repositioned and attempted to re-orient multiple times.  Received prn medications but didn't fall asleep until 3 am.  Techs got pt up to bsc -tolerated well

## 2017-07-24 NOTE — Progress Notes (Signed)
[facility] PROGRESS NOTE  Danielle [name] BJY:[id number] DOB: 12-05-1945 DOA: 07/17/2017 PCP: Patient, No Pcp Per  Interim summary and HPI Pt is 71 yo female with known hx of CVA, intra cranial hemorrhage, resides in SNF, presented to [facility] ED for evaluation of several days duration of epigastric abd pain, weakness and poor oral intake, watery diarrhea and non bloody vomiting. BP at the facility was 70/52 and she was sent to ED.  In ED, pt was tachycardic with HR up to 110's, BP 97/54, K 3.1, Cr 1.7, lipase 22, WBC 18.9. CT renal stone study notable for possible duodenitis vs pancreatitis, CXR unremarkable. Pt started on IV Vanc, Zosyn, empiric oral vanc for presumptive C. Diff and also started on IVF. Patient declining invasive intervention. Continue supportive care, IV antibiotics and follow clinical response.  Assessment/Plan: 1-Severe sepsis/septic shock -Appears to be secondary to duodenitis and colitis -Per GI recommendations no endoscopic interventions are planned -will continue current IV antibiotics and supportive care. -Slowly advancing diet (starting with clear liquid diet and move to full liquid) -Repeat CT scan of her abdomen demonstrated some improvement in her ongoing intestinal inflammation. -WBC's continue trending down -will transition antibiotics to PO   2-acute blood loss anemia/bloody stools -Status post 1 unit of blood transfusion on 10/29 with appropriate posttransfusion response. -Current hemoglobin has remains stable and no signs of acute bleeding -will monitor Hgb trend  -continue PPI -Patient not interested in any endoscopic evaluation -Continue to have some discomfort in her abdomen (especially on palpation); but much improved and asking for food now.. -Will follow hemoglobin trend intermittently.  3-sinus tachycardia -HR much better -Associated with sepsis and anemia -Will continue use as needed beta-blocker  4-acute kidney injury -Secondary to  prerenal azotemia and hypotension with decreased perfusion in the setting of sepsis. -Improved/resolved with IV fluids -Will follow basic metabolic panel intermittently  5-abdominal pain/diarrhea/ileus -Due to duodenitis and sigmoiditis -C. difficile tested and negative -Continue current antibiotics and supportive care -Repeated CT scan done on 10/30 first with some improvement in her intestinal inflammatory response. -Per GI recommendations and following patient's wishes no endoscopic intervention is planned. -continue Slowly advancing diet and follow clinical response.  6-acute respiratory failure with hypoxia/COPD exacerbation -Appears to be secondary to decrease in lung expansion from abd distension and overall tachypnea from sepsis and COPD exacerbation. -will continue BD's, start pulmicort and continue solumedrol BID -no need for BIPAP in the last 48 hours -will continue supportive care -ok to transfer to the floor. -will continue oxygen supplementation.   7-acute metabolic and toxic encephalopathy  -improved/close to baseline  -following commands and in no major distress. -will advance diet to full liquid -will monitor clinical response   8-history of depression/anxiety -No suicidal ideation or hallucinations -Continue Effexor and continue Valium.  9-hypernatremia -Resolved with use of D5 solution; currently just on maintenance  -will monitor electrolytes intermittently. 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   Code Status: DNR Family Communication: No family at bedside Disposition Plan: Transition antibiotics to oral regimen, continue advancing diet, continue steroids and Pulmicort for COPD exacerbation component and continue supportive care.  Hopefully discharge in the next 48-72 hours.  Physical  therapy has been requested to assess patient needs at discharge.   Consultants:  GI  PCCM  Procedures:  See below for x-ray reports  Antibiotics:  Zosyn 10/26 -->11/2  Vancomycin 10/26 --> 10/29   Oral Vancomycin 10/26 --> 10/27  Augmentin 11/2  HPI/Subjective: Alert, awake and oriented x2, no chest pain, still with some abdominal pain, denies nausea, vomiting and reported improvement in her breathing and appetite.  Objective: Vitals:   07/24/17 1345 07/24/17 1431  BP:  (!) 157/82  Pulse:  (!) 107  Resp:  18  Temp:  97.8 F (36.6 C)  SpO2: 99% 100%    Intake/Output Summary (Last 24 hours) at 07/24/17 1729 Last data filed at 07/24/17 1235  Gross per 24 hour  Intake                0 ml  Output             1300 ml  Net            -1300 ml   Filed Weights   07/23/17 0500 07/23/17 1409 07/24/17 0655  Weight: 79.6 kg (175 lb 7.8 oz) 82.9 kg (182 lb 12.8 oz) 81 kg (178 lb 9.6 oz)    Exam:   General: Overall feeling better.  Denies chest pain, no nausea or Foley vomiting.  Still with some abdominal discomfort but improved and looking to have her diet advanced.  Positive bowel movements without further bleeding reported.  Patient was pleasant, oriented x2 and in no acute distress during my evaluation.  Cardiovascular: Slight tachycardia, no rubs, no gallops, no JVD.     Respiratory: Mild expiratory wheezing, improved air movement bilaterally, normal respiratory effort still some tachypnea with exertion.  No using accessory muscles.  Patient has no required BiPAP and is having good oxygen saturation on 2 L.  Abdomen: Mild tenderness to palpation in left lower quadrant, soft, no guarding, positive bowel sounds, no distention.  Musculoskeletal: No edema, no cyanosis, no clubbing.  Data Reviewed: Basic Metabolic Panel:  Recent Labs Lab 07/17/17 2144  07/19/17 0048 07/20/17 0339 07/21/17 0315 07/22/17 0325 07/23/17 1352 07/24/17 0440  NA  --   < > 150* 140  139 137 135 136  K  --   < > 4.0 3.4* 3.3* 3.9 3.0* 3.3*  CL  --   < > 120* 113* 110 105 104 103  CO2  --   < > 14* 18* 20* 21* 22 22  GLUCOSE  --   < > 78 102* 99 85 115* 131*  BUN  --   < > 16 22* 20 15 7 6   CREATININE  --   < > 1.51* 1.26* 0.96 0.83 0.61 0.63  CALCIUM  --   < > 8.2* 7.6* 7.9* 8.1* 7.6* 7.9*  MG 2.0  --  1.8 1.7 2.5* 2.2  --   --   PHOS  --   --  3.7  --   --   --   --   --   < > = values in this interval not displayed. Liver Function Tests:  Recent Labs Lab 07/17/17 2142 07/19/17 0048  AST 31  --   ALT 13*  --   ALKPHOS 170*  --   BILITOT 0.8  --   PROT 6.1*  --   ALBUMIN 2.8* 2.6*    Recent Labs Lab 07/17/17 2144  LIPASE 22   CBC:  Recent Labs Lab 07/17/17 2142  07/20/17 0339 07/21/17 0315 07/22/17 0325 07/23/17 1352 07/24/17 0440  WBC 18.9*  < > 28.1* 23.3* 20.9* 15.7* 13.3*  NEUTROABS 17.6*  --   --   --   --   --   --  HGB 9.6*  < > 7.7* 9.3* 9.9* 8.3* 8.8*  HCT 30.3*  < > 23.9* 29.4* 30.7* 25.8* 26.8*  MCV 103.8*  < > 103.5* 99.3 99.7 98.9 97.5  PLT 603*  < > 393 409* 441* 451* 452*  < > = values in this interval not displayed.  BNP (last 3 results)  Recent Labs  07/17/17 2142  BNP 174.6*   CBG:  Recent Labs Lab 07/18/17 1342  GLUCAP 71    Recent Results (from the past 240 hour(s))  Blood Culture (routine x 2)     Status: None   Collection Time: 07/17/17  9:50 PM  Result Value Ref Range Status   Specimen Description BLOOD LEFT WRIST  Final   Special Requests IN PEDIATRIC BOTTLE Blood Culture adequate volume  Final   Culture   Final    NO GROWTH 5 DAYS Performed at [facility], [address] [place], [place] [address]    Report Status 07/23/2017 FINAL  Final  Blood Culture (routine x 2)     Status: None   Collection Time: 07/17/17  9:54 PM  Result Value Ref Range Status   Specimen Description BLOOD LEFT ANTECUBITAL  Final   Special Requests   Final    BOTTLES DRAWN AEROBIC AND ANAEROBIC Blood Culture  adequate volume   Culture   Final    NO GROWTH 5 DAYS Performed at [facility], [address], [place], [place] [address]    Report Status 07/23/2017 FINAL  Final  C difficile quick scan w PCR reflex     Status: None   Collection Time: 07/18/17 12:52 AM  Result Value Ref Range Status   C Diff antigen NEGATIVE NEGATIVE Final   C Diff toxin NEGATIVE NEGATIVE Final   C Diff interpretation No C. difficile detected.  Final  MRSA PCR Screening     Status: None   Collection Time: 07/18/17  6:34 AM  Result Value Ref Range Status   MRSA by PCR NEGATIVE NEGATIVE Final    Comment:        The GeneXpert MRSA Assay (FDA approved for NASAL specimens only), is one component of a comprehensive MRSA colonization surveillance program. It is not intended to diagnose MRSA infection nor to guide or monitor treatment for MRSA infections.   Gastrointestinal Panel by PCR , Stool     Status: None   Collection Time: 07/18/17  1:32 PM  Result Value Ref Range Status   Campylobacter species NOT DETECTED NOT DETECTED Final   Plesimonas shigelloides NOT DETECTED NOT DETECTED Final   Salmonella species NOT DETECTED NOT DETECTED Final   Yersinia enterocolitica NOT DETECTED NOT DETECTED Final   Vibrio species NOT DETECTED NOT DETECTED Final   Vibrio cholerae NOT DETECTED NOT DETECTED Final   Enteroaggregative E coli (EAEC) NOT DETECTED NOT DETECTED Final   Enteropathogenic E coli (EPEC) NOT DETECTED NOT DETECTED Final   Enterotoxigenic E coli (ETEC) NOT DETECTED NOT DETECTED Final   Shiga like toxin producing E coli (STEC) NOT DETECTED NOT DETECTED Final   Shigella/Enteroinvasive E coli (EIEC) NOT DETECTED NOT DETECTED Final   Cryptosporidium NOT DETECTED NOT DETECTED Final   Cyclospora cayetanensis NOT DETECTED NOT DETECTED Final   Entamoeba histolytica NOT DETECTED NOT DETECTED Final   Giardia lamblia NOT DETECTED NOT DETECTED Final   Adenovirus F40/41 NOT DETECTED NOT DETECTED Final    Astrovirus NOT DETECTED NOT DETECTED Final   Norovirus GI/GII NOT DETECTED NOT DETECTED Final  Rotavirus A NOT DETECTED NOT DETECTED Final   Sapovirus (I, II, IV, and V) NOT DETECTED NOT DETECTED Final     Studies: No results found.  Scheduled Meds: . amoxicillin-clavulanate  1 tablet Oral Q12H  . budesonide (PULMICORT) nebulizer solution  0.5 mg Nebulization BID  . diazepam  2 mg Oral TID  . levalbuterol  0.63 mg Nebulization TID  . methylPREDNISolone (SOLU-MEDROL) injection  40 mg Intravenous Q12H  . nicotine  21 mg Transdermal Daily  . pantoprazole  40 mg Intravenous Q12H  . venlafaxine  75 mg Oral BID   Continuous Infusions: . dextrose 30 mL/hr at 07/23/17 1233  . magnesium sulfate 1 - 4 g bolus IVPB      Principal Problem:   AKI (acute kidney injury) (HCC) Active Problems:   Abdominal pain   Colitis   COPD (chronic obstructive pulmonary disease) (HCC)   Acute respiratory failure with hypoxia (HCC)    Time spent: 30 minutes    Vassie LollMadera, Jarrick Fjeld  [facility] Pager [phone]. If 7PM-7AM, please contact night-coverage at [email or web address], password [email or web address] 07/24/2017, 5:29 PM  LOS: 6 days

## 2017-07-25 MED ORDER — SACCHAROMYCES BOULARDII 250 MG PO CAPS
250.0000 mg | ORAL_CAPSULE | Freq: Two times a day (BID) | ORAL | Status: DC
  Administered 2017-07-25 – 2017-07-27 (×5): 250 mg via ORAL
  Filled 2017-07-25 (×5): qty 1

## 2017-07-25 MED ORDER — METHYLPREDNISOLONE SODIUM SUCC 40 MG IJ SOLR: 40.0000 mg | INTRAMUSCULAR | Status: DC

## 2017-07-25 MED ORDER — DIAZEPAM 2 MG PO TABS
2.0000 mg | ORAL_TABLET | Freq: Once | ORAL | Status: AC
  Administered 2017-07-25: 2 mg via ORAL
  Filled 2017-07-25: qty 1

## 2017-07-25 NOTE — Progress Notes (Signed)
[facility] PROGRESS NOTE  Finis BudVickie Heidelberg ZOX:[id number] DOB: 09/07/1946 DOA: 07/17/2017 PCP: Patient, No Pcp Per  Interim summary and HPI Pt is 71 yo female with known hx of CVA, intra cranial hemorrhage, resides in SNF, [facility] evaluation of several days duration of epigastric abd pain, weakness and poor oral intake, watery diarrhea and non bloody vomiting. BP at the facility was 70/52 and she was sent to ED.  In ED, pt was tachycardic with HR up to 110's, BP 97/54, K 3.1, Cr 1.7, lipase 22, WBC 18.9. CT renal stone study notable for possible duodenitis vs pancreatitis, CXR unremarkable. Pt started on IV Vanc, Zosyn, empiric oral vanc for presumptive C. Diff and also started on IVF. Patient declining invasive intervention. Continue supportive care, IV antibiotics and follow clinical response.  Assessment/Plan: 1-Severe sepsis/septic shock -Appears to be secondary to duodenitis and colitis -Per GI recommendations no endoscopic interventions are planned -diet advance to soft -Repeat CT scan of her abdomen demonstrated some improvement in her ongoing intestinal inflammation. -WBC's continue trending down -will continue PO antibiotics for 5 more days  2-acute blood loss anemia/bloody stools -Status post 1 unit of blood transfusion on 10/29 with appropriate posttransfusion response. -Current hemoglobin has remains stable and no signs of acute bleeding -will monitor Hgb trend  -continue PPI -Patient not interested in any endoscopic evaluation -Will follow hemoglobin trend intermittently.  3-sinus tachycardia -HR much better -Associated with sepsis and anemia -Will continue using as needed beta-blocker  4-acute kidney injury -Secondary to prerenal azotemia and hypotension with decreased perfusion in the setting of sepsis. -Improved/resolved with IV fluids -Will follow basic metabolic panel intermittently  5-abdominal pain/diarrhea/ileus -Due to duodenitis and  sigmoiditis -C. difficile tested and negative -Continue current antibiotics and supportive care -Repeated CT scan done on 10/30 first with some improvement in her intestinal inflammatory response. -Per GI recommendations and following patient's wishes no endoscopic intervention is planned. -continue Slowly advancing diet and follow clinical response.  6-acute respiratory failure with hypoxia/COPD exacerbation -Appears to be secondary to decrease in lung expansion from abd distension and overall tachypnea from sepsis and COPD exacerbation. -will continue BD's, continue pulmicort and continue solumedrol BID, but start tapering  -will continue supportive care -will continue oxygen supplementation.   7-acute metabolic and toxic encephalopathy  -improved/close to baseline  -following commands and in no major distress. -will advance diet to soft diet -will monitor clinical response   8-history of depression/anxiety -No suicidal ideation or hallucinations on my exam -but per nursing reports patient mentation fluctuating and experiencing episodes of agitation. -Continue Effexor and continue Valium.  9-hypernatremia -Resolved with use of D5 solution; currently just on maintenance  -will monitor electrolytes intermittently. 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   Code Status: DNR Family Communication: No family at bedside Disposition Plan: patient doing better. PT recommending SNF. Will fully advance diet and transition all regimen to PO. Hopefully back to SNF tomorrow.  Consultants:  GI  PCCM  Procedures:  See below for x-ray reports  Antibiotics:  Zosyn 10/26 -->11/2  Vancomycin 10/26 --> 10/29   Oral Vancomycin 10/26 --> 10/27  Augmentin 11/2  HPI/Subjective: Oriented  X2, no CP, no nausea, no vomiting. Still with some abd pain and exp wheezing.  Objective: Vitals:   07/25/17 1415 07/25/17 1922  BP: Marland Kitchen(!)  155/82   Pulse: 92   Resp: 16   Temp: 97.6 F (36.4 C)   SpO2: 96% 95%    Intake/Output Summary (Last 24 hours) at 07/25/17 2151 Last data filed at 07/25/17 1800  Gross per 24 hour  Intake              120 ml  Output             1000 ml  Net             -880 ml   Filed Weights   07/23/17 1409 07/24/17 0655 07/25/17 1000  Weight: 82.9 kg (182 lb 12.8 oz) 81 kg (178 lb 9.6 oz) 73.9 kg (163 lb)    Exam:   General: no CP, improved SOB, no nausea, no vomiting. Having loose stools. Oriented X2. Per nursing staff   Cardiovascular: RRR, no rubs, no gallops  Respiratory: very little exp wheezing, no crackles, good air movement bilaterally and not requiring oxygen supplementation.  Abdomen: positive BS, mild tenderness in LLQ, no distension  Musculoskeletal: no edema, no cyanosis, no clubbing   Data Reviewed: Basic Metabolic Panel:  Recent Labs Lab 07/19/17 0048 07/20/17 0339 07/21/17 0315 07/22/17 0325 07/23/17 1352 07/24/17 0440  NA 150* 140 139 137 135 136  K 4.0 3.4* 3.3* 3.9 3.0* 3.3*  CL 120* 113* 110 105 104 103  CO2 14* 18* 20* 21* 22 22  GLUCOSE 78 102* 99 85 115* 131*  BUN 16 22* 20 15 7 6   CREATININE 1.51* 1.26* 0.96 0.83 0.61 0.63  CALCIUM 8.2* 7.6* 7.9* 8.1* 7.6* 7.9*  MG 1.8 1.7 2.5* 2.2  --   --   PHOS 3.7  --   --   --   --   --    Liver Function Tests:  Recent Labs Lab 07/19/17 0048  ALBUMIN 2.6*   CBC:  Recent Labs Lab 07/20/17 0339 07/21/17 0315 07/22/17 0325 07/23/17 1352 07/24/17 0440  WBC 28.1* 23.3* 20.9* 15.7* 13.3*  HGB 7.7* 9.3* 9.9* 8.3* 8.8*  HCT 23.9* 29.4* 30.7* 25.8* 26.8*  MCV 103.5* 99.3 99.7 98.9 97.5  PLT 393 409* 441* 451* 452*    BNP (last 3 results)  Recent Labs  07/17/17 2142  BNP 174.6*   CBG: No results for input(s): GLUCAP in the last 168 hours.  Recent  Results (from the past 240 hour(s))  Blood Culture (routine x 2)     Status: None   Collection Time: 07/17/17  9:50 PM  Result Value Ref Range Status   Specimen Description BLOOD LEFT WRIST  Final   Special Requests IN PEDIATRIC BOTTLE Blood Culture adequate volume  Final   Culture   Final    NO GROWTH 5 DAYS Performed at [facility], [address], [place], [place] [address]    Report Status 07/23/2017 FINAL  Final  Blood Culture (routine x 2)     Status: None   Collection Time: 07/17/17  9:54 PM  Result Value Ref Range Status   Specimen Description BLOOD LEFT ANTECUBITAL  Final   Special Requests   Final    BOTTLES DRAWN AEROBIC AND ANAEROBIC Blood Culture adequate volume   Culture   Final    NO GROWTH 5 DAYS Performed at [facility], [address], [place], [place] [address]    Report Status 07/23/2017 FINAL  Final  C difficile quick scan w PCR reflex     Status: None   Collection Time: 07/18/17  12:52 AM  Result Value Ref Range Status   C Diff antigen NEGATIVE NEGATIVE Final   C Diff toxin NEGATIVE NEGATIVE Final   C Diff interpretation No C. difficile detected.  Final  MRSA PCR Screening     Status: None   Collection Time: 07/18/17  6:34 AM  Result Value Ref Range Status   MRSA by PCR NEGATIVE NEGATIVE Final    Comment:        The GeneXpert MRSA Assay (FDA approved for NASAL specimens only), is one component of a comprehensive MRSA colonization surveillance program. It is not intended to diagnose MRSA infection nor to guide or monitor treatment for MRSA infections.   Gastrointestinal Panel by PCR , Stool     Status: None   Collection Time: 07/18/17  1:32 PM  Result Value Ref Range Status   Campylobacter species NOT DETECTED NOT DETECTED Final   Plesimonas shigelloides NOT DETECTED NOT DETECTED Final   Salmonella species NOT DETECTED NOT DETECTED Final   Yersinia enterocolitica NOT DETECTED NOT DETECTED Final   Vibrio species NOT DETECTED NOT  DETECTED Final   Vibrio cholerae NOT DETECTED NOT DETECTED Final   Enteroaggregative E coli (EAEC) NOT DETECTED NOT DETECTED Final   Enteropathogenic E coli (EPEC) NOT DETECTED NOT DETECTED Final   Enterotoxigenic E coli (ETEC) NOT DETECTED NOT DETECTED Final   Shiga like toxin producing E coli (STEC) NOT DETECTED NOT DETECTED Final   Shigella/Enteroinvasive E coli (EIEC) NOT DETECTED NOT DETECTED Final   Cryptosporidium NOT DETECTED NOT DETECTED Final   Cyclospora cayetanensis NOT DETECTED NOT DETECTED Final   Entamoeba histolytica NOT DETECTED NOT DETECTED Final   Giardia lamblia NOT DETECTED NOT DETECTED Final   Adenovirus F40/41 NOT DETECTED NOT DETECTED Final   Astrovirus NOT DETECTED NOT DETECTED Final   Norovirus GI/GII NOT DETECTED NOT DETECTED Final   Rotavirus A NOT DETECTED NOT DETECTED Final   Sapovirus (I, II, IV, and V) NOT DETECTED NOT DETECTED Final     Studies: No results found.  Scheduled Meds: . amoxicillin-clavulanate  1 tablet Oral Q12H  . budesonide (PULMICORT) nebulizer solution  0.5 mg Nebulization BID  . diazepam  2 mg Oral TID  . levalbuterol  0.63 mg Nebulization TID  . methylPREDNISolone (SOLU-MEDROL) injection  40 mg Intravenous Q12H  . nicotine  21 mg Transdermal Daily  . pantoprazole  40 mg Intravenous Q12H  . saccharomyces boulardii  250 mg Oral BID  . venlafaxine  75 mg Oral BID   Continuous Infusions: . dextrose 30 mL/hr at 07/23/17 1233  . magnesium sulfate 1 - 4 g bolus IVPB      Principal Problem:   AKI (acute kidney injury) (HCC) Active Problems:   Abdominal pain   Colitis   COPD (chronic obstructive pulmonary disease) (HCC)   Acute respiratory failure with hypoxia (HCC)    Time spent: 30 minutes    Vassie Loll  [facility] Pager [phone]. If 7PM-7AM, please contact night-coverage at [email or web address], password [facility] 07/25/2017, 9:51 PM  LOS: 7 days

## 2017-07-25 NOTE — Progress Notes (Signed)
Pts. IV became infiltrated. IV team was unable to establish IV access. PM physician stated they will see about getting a PICC in tomorrow.

## 2017-07-25 NOTE — Evaluation (Signed)
Physical Therapy Evaluation Patient Details Name: Danielle [name] MRN: [id number] DOB: 28-Sep-1945 Today's Date: 07/25/2017   History of Present Illness  Danielle [name] is a 71 y.o. female with medical history significant for CVA,, Intra-cranial hrr, who presented to the Ed on 07/18/2017  from SNF complaints of abdominal pain, weakness, of 2 days' duration.patient's blood pressure was noted to be 70/52  Clinical Impression  Pt was able to move all 4 extremities and assist with sit to stand and stand pivot to chair transfers.  Feel she has potential to improve in transfers and possibly gait  with continued PT at SNF to decrease burden of care     Follow Up Recommendations SNF    Equipment Recommendations  None recommended by PT    Recommendations for Other Services OT consult     Precautions / Restrictions Precautions Precautions: Fall Restrictions Weight Bearing Restrictions: No      Mobility  Bed Mobility               General bed mobility comments: pt up in chair   Transfers Overall transfer level: Needs assistance Equipment used: Rolling walker (2 wheeled) Transfers: Sit to/from [facility] Sit to Stand: Mod assist Stand pivot transfers: Mod assist (to bedside commode with RW )       General transfer comment: pt follows verbal cues to push off of arms of chair and is able to stand with walker and pivot to bedside commode with mod  assist   Ambulation/Gait Ambulation/Gait assistance: +2 physical assistance           General Gait Details: needs further assessment   Stairs            Wheelchair Mobility    Modified Rankin (Stroke Patients Only)       Balance Overall balance assessment: Needs assistance Sitting-balance support: Bilateral upper extremity supported Sitting balance-Leahy Scale: Fair     Standing balance support: Bilateral upper extremity supported Standing balance-Leahy Scale: Fair                                Pertinent Vitals/Pain Pain Assessment:  (pt says she is uncomfortable and wants to go back to bed )    Home Living Family/patient expects to be discharged to:: Skilled nursing facility                      Prior Function Level of Independence:  (unknown )               Hand Dominance        Extremity/Trunk Assessment        Lower Extremity Assessment Lower Extremity Assessment: Generalized weakness    Cervical / Trunk Assessment Cervical / Trunk Assessment: Other exceptions Cervical / Trunk Exceptions: obese, generally weak   Communication   Communication: No difficulties  Cognition Arousal/Alertness: Awake/alert Behavior During Therapy: Restless Overall Cognitive Status: No family/caregiver present to determine baseline cognitive functioning                                 General Comments: at beginning of treatment, pt had legs off to side of recliner and pt continually states she wants to go back to bed and was uncomfortable.  She appeared to be better when given a warm blanket and pillows for comfort at end of  treatment  General Comments General comments (skin integrity, edema, etc.): pt has allevyn on right scapular area, appears to have generalized muscle atrophy     Exercises     Assessment/Plan    PT Assessment Patient needs continued PT services  PT Problem List Decreased strength;Decreased activity tolerance;Decreased balance;Decreased mobility       PT Treatment Interventions DME instruction;Gait training;Functional mobility training;Therapeutic activities;Therapeutic exercise;Balance training;Neuromuscular re-education;Patient/family education    PT Goals (Current goals can be found in the Care Plan section)  Acute Rehab PT Goals Patient Stated Goal: to get back to bed  PT Goal Formulation: Patient unable to participate in goal setting Time For Goal Achievement: 08/08/17 Potential to Achieve  Goals: Fair    Frequency Min 2X/week   Barriers to discharge        Co-evaluation               AM-PAC PT "6 Clicks" Daily Activity  Outcome Measure Difficulty turning over in bed (including adjusting bedclothes, sheets and blankets)?: A Lot Difficulty moving from lying on back to sitting on the side of the bed? : A Lot Difficulty sitting down on and standing up from a chair with arms (e.g., wheelchair, bedside commode, etc,.)?: A Lot Help needed moving to and from a bed to chair (including a wheelchair)?: A Lot Help needed walking in hospital room?: A Lot Help needed climbing 3-5 steps with a railing? : Total 6 Click Score: 11    End of Session Equipment Utilized During Treatment: Gait belt;Oxygen Activity Tolerance: Patient limited by fatigue;Treatment limited secondary to agitation Patient left: in chair;with call bell/phone within reach;with chair alarm set Nurse Communication: Mobility status PT Visit Diagnosis: Unsteadiness on feet (R26.81);Muscle weakness (generalized) (M62.81)    Time:  -      Charges:   PT Evaluation $PT Eval Low Complexity: 1 Low     PT G Codes:        Teresa K. Owens Shark, PT   Norwood Levo 07/25/2017, 11:52 AM

## 2017-07-25 NOTE — Progress Notes (Signed)
Report called to Marchelle FolksAmanda, RN that will receive [address]  Patient stable from PM assessment.

## 2017-07-26 ENCOUNTER — Other Ambulatory Visit: Payer: Self-pay

## 2017-07-26 LAB — CBC
HEMATOCRIT: 36 % (ref 36.0–46.0)
HEMOGLOBIN: 11.7 g/dL — AB (ref 12.0–15.0)
MCH: 32.3 pg (ref 26.0–34.0)
MCHC: 32.5 g/dL (ref 30.0–36.0)
MCV: 99.4 fL (ref 78.0–100.0)
Platelets: 472 10*3/uL — ABNORMAL HIGH (ref 150–400)
RBC: 3.62 MIL/uL — ABNORMAL LOW (ref 3.87–5.11)
RDW: 15.7 % — AB (ref 11.5–15.5)
WBC: 14.2 10*3/uL — AB (ref 4.0–10.5)

## 2017-07-26 LAB — BASIC METABOLIC PANEL
ANION GAP: 11 (ref 5–15)
BUN: 5 mg/dL — ABNORMAL LOW (ref 6–20)
CHLORIDE: 101 mmol/L (ref 101–111)
CO2: 27 mmol/L (ref 22–32)
Calcium: 8.5 mg/dL — ABNORMAL LOW (ref 8.9–10.3)
Creatinine, Ser: 0.62 mg/dL (ref 0.44–1.00)
GFR calc non Af Amer: >60 mL/min (ref >60)
Glucose, Bld: 97 mg/dL (ref 65–99)
Potassium: 2.9 mmol/L — ABNORMAL LOW (ref 3.5–5.1)
Sodium: 139 mmol/L (ref 135–145)

## 2017-07-26 MED ORDER — PANTOPRAZOLE SODIUM 40 MG PO TBEC
40.0000 mg | DELAYED_RELEASE_TABLET | Freq: Two times a day (BID) | ORAL | Status: DC
  Administered 2017-07-26 – 2017-07-27 (×2): 40 mg via ORAL
  Filled 2017-07-26 (×2): qty 1

## 2017-07-26 MED ORDER — POTASSIUM CHLORIDE CRYS ER 20 MEQ PO TBCR
40.0000 meq | EXTENDED_RELEASE_TABLET | ORAL | Status: AC
  Administered 2017-07-26 – 2017-07-27 (×2): 40 meq via ORAL
  Filled 2017-07-26 (×2): qty 2

## 2017-07-26 MED ORDER — PREDNISONE 20 MG PO TABS
40.0000 mg | ORAL_TABLET | Freq: Every day | ORAL | Status: DC
  Administered 2017-07-27: 40 mg via ORAL
  Filled 2017-07-26: qty 2

## 2017-07-26 NOTE — NC FL2 (Signed)
[place] MEDICAID FL2 LEVEL OF CARE SCREENING TOOL     IDENTIFICATION  Patient Name: Danielle [name] Birthdate: [birth year] Sex: female Admission Date (Current Location): 07/17/2017  [facility] [place]   Facility and Address:  [facility],  [address], [place] [id number]      Provider Number: [id number]  Attending Physician Name and Address:  Vassie LollMadera, Carlos, MD  Relative Name and Phone Number:       Current Level of Care: Hospital Recommended Level of Care: Skilled Nursing Facility Prior Approval Number:    Date Approved/Denied:   PASRR Number: Pending; PASRR website not working  Discharge Plan: SNF    Current Diagnoses: Patient Active Problem List   Diagnosis Date Noted  . Acute respiratory failure with hypoxia (HCC)   . Abdominal pain 07/18/2017  . Colitis 07/18/2017  . COPD (chronic obstructive pulmonary disease) (HCC) 07/18/2017  . Cholelithiasis 05/16/2017  . Hyperchloremic metabolic acidosis 05/12/2017  . AKI (acute kidney injury) (HCC) 05/11/2017  . Leukocytosis 05/11/2017  . Metabolic acidosis 05/11/2017  . Lower extremity weakness 05/11/2017  . ICH (intracerebral hemorrhage) (HCC) 05/26/2014  . Hyponatremia 05/26/2014  . Acute encephalopathy 05/26/2014  . Sepsis (HCC) 05/26/2014  . CVA (cerebral vascular accident) (HCC) 05/25/2014  . Intracerebral hemorrhage (HCC) 05/25/2014    Orientation RESPIRATION BLADDER Height & Weight     Self, Situation, Place  O2( 2L) External catheter(catheter placed 07/18/17) Weight: 156 lb (70.8 kg) Height:  5\' 5"  (165.1 cm)  BEHAVIORAL SYMPTOMS/MOOD NEUROLOGICAL BOWEL NUTRITION STATUS      Incontinent Diet(soft)  AMBULATORY STATUS COMMUNICATION OF NEEDS Skin   Extensive Assist Verbally Skin abrasions(back arm abrasion)                       Personal Care Assistance Level of Assistance  Bathing, Feeding, Dressing Bathing Assistance: Maximum assistance Feeding  assistance: Limited assistance Dressing Assistance: Maximum assistance     Functional Limitations Info             SPECIAL CARE FACTORS FREQUENCY  PT (By licensed PT), OT (By licensed OT), Speech therapy     PT Frequency: 5x/wk OT Frequency: 5x/wk     Speech Therapy Frequency: 1x/wk      Contractures      Additional Factors Info  Code Status, Allergies, Psychotropic Code Status Info: DNR Allergies Info: NKA Psychotropic Info: Valium 2mg  3x/day; Effexor 75mg  2x/day         Current Medications (07/26/2017):  This is the current hospital active medication list Current Facility-Administered Medications  Medication Dose Route Frequency Provider Last Rate Last Dose  . amoxicillin-clavulanate (AUGMENTIN) 875-125 MG per tablet 1 tablet  1 tablet Oral Q12H Vassie LollMadera, Carlos, MD   1 tablet at 07/26/17 0936  . budesonide (PULMICORT) nebulizer solution 0.5 mg  0.5 mg Nebulization BID Vassie LollMadera, Carlos, MD   0.5 mg at 07/26/17 0902  . dextrose 5 % solution   Intravenous Continuous Dorothea OgleMyers, Iskra M, MD 30 mL/hr at 07/23/17 1233    . diazepam (VALIUM) tablet 2 mg  2 mg Oral TID Roslynn AmbleNestor, Jennings E, MD   2 mg at 07/26/17 [id number]  . iopamidol (ISOVUE-300) 61 % injection 15 mL  15 mL Oral Once PRN Dorothea OgleMyers, Iskra M, MD   15 mL at 07/21/17 1715  . levalbuterol (XOPENEX) nebulizer solution 0.63 mg  0.63 mg Nebulization TID Vassie LollMadera, Carlos, MD   0.63 mg at 07/26/17 0855  . levalbuterol (XOPENEX) nebulizer solution 1.25 mg  1.25 mg Nebulization Q6H PRN Dorothea Ogle, MD   1.25 mg at 07/23/17 2102  . magnesium sulfate IVPB 2 g 50 mL  2 g Intravenous Once Dorothea Ogle, MD      . methylPREDNISolone sodium succinate (SOLU-MEDROL) 40 mg/mL injection 40 mg  40 mg Intravenous Q24H Vassie Loll, MD      . nicotine (NICODERM CQ - dosed in mg/24 hours) patch 21 mg  21 mg Transdermal Daily Dorothea Ogle, MD   21 mg at 07/26/17 3244  . ondansetron (ZOFRAN) tablet 4 mg  4 mg Oral Q6H PRN Emokpae, Ejiroghene E, MD        Or  . ondansetron (ZOFRAN) injection 4 mg  4 mg Intravenous Q6H PRN Emokpae, Ejiroghene E, MD   4 mg at 07/21/17 1803  . oxyCODONE (Oxy IR/ROXICODONE) immediate release tablet 5 mg  5 mg Oral Q6H PRN Emokpae, Ejiroghene E, MD   5 mg at 07/25/17 1600  . pantoprazole (PROTONIX) injection 40 mg  40 mg Intravenous Q12H Dorothea Ogle, MD   40 mg at 07/26/17 0936  . saccharomyces boulardii (FLORASTOR) capsule 250 mg  250 mg Oral BID Vassie Loll, MD   250 mg at 07/26/17 0936  . venlafaxine [facility]) tablet 75 mg  75 mg Oral BID Emokpae, Ejiroghene E, MD   75 mg at 07/26/17 0936  . zolpidem (AMBIEN) tablet 5 mg  5 mg Oral QHS PRN Leda Gauze, NP   5 mg at 07/25/17 2223     Discharge Medications: Please see discharge summary for a list of discharge medications.  Relevant Imaging Results:  Relevant Lab Results:   Additional Information SS#: [id number]  Baldemar Lenis, LCSW

## 2017-07-26 NOTE — Progress Notes (Signed)
[facility] PROGRESS NOTE  Danielle [name] ZOX:[id number] DOB: 1945/12/26 DOA: 07/17/2017 PCP: Patient, No Pcp Per  Interim summary and HPI Pt is 71 yo female with known hx of CVA, intra cranial hemorrhage, resides in SNF, presented to [facility] ED for evaluation of several days duration of epigastric abd pain, weakness and poor oral intake, watery diarrhea and non bloody vomiting. BP at the facility was 70/52 and she was sent to ED.  In ED, pt was tachycardic with HR up to 110's, BP 97/54, K 3.1, Cr 1.7, lipase 22, WBC 18.9. CT renal stone study notable for possible duodenitis vs pancreatitis, CXR unremarkable. Pt started on IV Vanc, Zosyn, empiric oral vanc for presumptive C. Diff and also started on IVF. Patient declining invasive intervention. Continue supportive care, IV antibiotics and follow clinical response.  Assessment/Plan: 1-Severe sepsis/septic shock -Appears to be secondary to duodenitis and colitis -Per GI recommendations no endoscopic interventions planned or needed at this point. -diet advance to soft diet and so far well tolerated; even patient appetite is overall poor -Repeat CT scan of her abdomen demonstrated some improvement in her ongoing intestinal inflammation. -WBC's were trending down; now up probably from use of steroids. -will continue PO antibiotics for 4 more days  2-acute blood loss anemia/bloody stools -Status post 1 unit of blood transfusion on 10/29 with appropriate posttransfusion response. -Current hemoglobin has remains stable and no signs of acute bleeding -will monitor Hgb trend intermittently -continue PPI -Patient not interested in any endoscopic evaluation -no further signs of active bleeding   3-sinus tachycardia -HR much better controlled -most likely Associated with sepsis and anemia -Will continue using as needed beta-blocker  4-acute kidney injury -Secondary to prerenal azotemia and hypotension with decreased perfusion in the setting  of sepsis. -Improved/resolved with IV fluids resuscitation -Will follow basic metabolic panel intermittently to assess electrolytes and renal function.  5-abdominal pain/diarrhea/ileus -Due to duodenitis and sigmoiditis -C. difficile tested and negative -Continue current antibiotics and supportive care -Repeated CT scan done on 10/30 first with some improvement in her intestinal inflammatory response. -Per GI recommendations and following patient's wishes no endoscopic intervention is planned. -Patient tolerating diet.  Follow clinical response.  6-acute respiratory failure with hypoxia/COPD exacerbation -Appears to be secondary to decrease in lung expansion from abd distension and overall tachypnea from sepsis and COPD exacerbation. -will continue BD's, continue pulmicort and continue steroids tapering   -will continue supportive care -will continue oxygen supplementation.   7-acute metabolic and toxic encephalopathy  -Improve and essentially back to baseline. -Patient able to follow simple commands and is in no distress. -Continue supportive care and constant reorientation.  8-history of depression/anxiety -No suicidal ideation or hallucinations -nursing staff reported patient mentation fluctuating and experiencing episodes of agitation. -Will continue Effexor and continue Valium.  9-hypernatremia -Resolved with use of D5 solution; currently just on maintenance  -will monitor electrolytes intermittently. -advise to follow good oral hydration and good nutrition.  10-hypokalemia -continue repletion as needed -Appears to be associated with the GI losses -Follow basic metabolic panel in the morning, check magnesium level  11-class 1 obesity -low calorie diet and increase physical activity mentioned to patient  -Body mass index is 25.96 kg/m.   Code Status: DNR Family Communication: No family at bedside Disposition Plan: patient doing much better overall. PT recommending  SNF.  Social worker aware and helping with discharge.  Patient is hemodynamically stable and ready to go to SNF.  Consultants:  GI  PCCM  Procedures:  See below for  x-ray reports  Antibiotics:  Zosyn 10/26 -->11/2  Vancomycin 10/26 --> 10/29   Oral Vancomycin 10/26 --> 10/27  Augmentin 11/2  HPI/Subjective: Afebrile, no chest pain, reports breathing is much better and stable.  No nausea, no vomiting, tolerating diet with improvement in her loose stools.  Objective: Vitals:   07/26/17 1427 07/26/17 1535  BP: (!) 142/75   Pulse: (!) 105   Resp: 18   Temp: 97.7 F (36.5 C)   SpO2: 99% 100%    Intake/Output Summary (Last 24 hours) at 07/26/2017 1613 Last data filed at 07/26/2017 0931 Gross per 24 hour  Intake 170 ml  Output 700 ml  Net -530 ml   Filed Weights   07/24/17 0655 07/25/17 1000 07/26/17 0419  Weight: 81 kg (178 lb 9.6 oz) 73.9 kg (163 lb) 70.8 kg (156 lb)    Exam:   General: Alert, awake and oriented x2, no chest pain, no nausea, no vomiting.  Reports some improvement in her loose stools and expressed that her breathing is a stable.    Cardiovascular: Regular rate, no rubs, no gallops, no murmur, no JVD on exam.   Respiratory: Improved air movement bilaterally, positive for scattered rhonchi, no wheezing, no crackles appreciated on exam today.  Patient wearing 1-2 L nasal cannula supplementation..  Abdomen: Positive bowel sounds, still with some tenderness in her left lower quadrant with deep palpation, no guarding, no distention.    Musculoskeletal: No edema, no cyanosis, no clubbing.     Data Reviewed: Basic Metabolic Panel: Recent Labs  Lab 07/20/17 0339 07/21/17 0315 07/22/17 0325 07/23/17 1352 07/24/17 0440 07/26/17 1101  NA 140 139 137 135 136 139  K 3.4* 3.3* 3.9 3.0* 3.3* 2.9*  CL 113* 110 105 104 103 101  CO2 18* 20* 21* 22 22 27   GLUCOSE 102* 99 85 115* 131* 97  BUN 22* 20 15 7 6  5*  CREATININE 1.26* 0.96 0.83 0.61 0.63 0.62   CALCIUM 7.6* 7.9* 8.1* 7.6* 7.9* 8.5*  MG 1.7 2.5* 2.2  --   --   --    CBC: Recent Labs  Lab 07/21/17 0315 07/22/17 0325 07/23/17 1352 07/24/17 0440 07/26/17 1101  WBC 23.3* 20.9* 15.7* 13.3* 14.2*  HGB 9.3* 9.9* 8.3* 8.8* 11.7*  HCT 29.4* 30.7* 25.8* 26.8* 36.0  MCV 99.3 99.7 98.9 97.5 99.4  PLT 409* 441* 451* 452* 472*    BNP (last 3 results) Recent Labs    07/17/17 2142  BNP 174.6*   CBG: No results for input(s): GLUCAP in the last 168 hours.  Recent Results (from the past 240 hour(s))  Blood Culture (routine x 2)     Status: None   Collection Time: 07/17/17  9:50 PM  Result Value Ref Range Status   Specimen Description BLOOD LEFT WRIST  Final   Special Requests IN PEDIATRIC BOTTLE Blood Culture adequate volume  Final   Culture   Final    NO GROWTH 5 DAYS Performed at [facility], [address] [place], [place] [address]    Report Status 07/23/2017 FINAL  Final  Blood Culture (routine x 2)     Status: None   Collection Time: 07/17/17  9:54 PM  Result Value Ref Range Status   Specimen Description BLOOD LEFT ANTECUBITAL  Final   Special Requests   Final    BOTTLES DRAWN AEROBIC AND ANAEROBIC Blood Culture adequate volume   Culture   Final    NO GROWTH 5 DAYS Performed at [facility], [address] [place], [place] [address]    Report Status 07/23/2017 FINAL  Final  C difficile quick scan w PCR reflex     Status: None   Collection Time: 07/18/17 12:52 AM  Result Value Ref Range Status   C Diff antigen NEGATIVE NEGATIVE Final   C Diff toxin NEGATIVE NEGATIVE Final   C Diff interpretation No C. difficile detected.  Final  MRSA PCR Screening     Status: None   Collection Time: 07/18/17  6:34 AM  Result Value Ref Range Status   MRSA by PCR NEGATIVE NEGATIVE Final    Comment:        The GeneXpert MRSA Assay (FDA approved for NASAL specimens only), is one component of a comprehensive MRSA colonization surveillance program. It is  not intended to diagnose MRSA infection nor to guide or monitor treatment for MRSA infections.   Gastrointestinal Panel by PCR , Stool     Status: None   Collection Time: 07/18/17  1:32 PM  Result Value Ref Range Status   Campylobacter species NOT DETECTED NOT DETECTED Final   Plesimonas shigelloides NOT DETECTED NOT DETECTED Final   Salmonella species NOT DETECTED NOT DETECTED Final   Yersinia enterocolitica NOT DETECTED NOT DETECTED Final   Vibrio species NOT DETECTED NOT DETECTED Final   Vibrio cholerae NOT DETECTED NOT DETECTED Final   Enteroaggregative E coli (EAEC) NOT DETECTED NOT DETECTED Final   Enteropathogenic E coli (EPEC) NOT DETECTED NOT DETECTED Final   Enterotoxigenic E coli (ETEC) NOT DETECTED NOT DETECTED Final   Shiga like toxin producing E coli (STEC) NOT DETECTED NOT DETECTED Final   Shigella/Enteroinvasive E coli (EIEC) NOT DETECTED NOT DETECTED Final   Cryptosporidium NOT DETECTED NOT DETECTED Final   Cyclospora cayetanensis NOT DETECTED NOT DETECTED Final   Entamoeba histolytica NOT DETECTED NOT DETECTED Final   Giardia lamblia NOT DETECTED NOT DETECTED Final   Adenovirus F40/41 NOT DETECTED NOT DETECTED Final   Astrovirus NOT DETECTED NOT DETECTED Final   Norovirus GI/GII NOT DETECTED NOT DETECTED Final   Rotavirus A NOT DETECTED NOT DETECTED Final   Sapovirus (I, II, IV, and V) NOT DETECTED NOT DETECTED Final     Studies: No results found.  Scheduled Meds: . amoxicillin-clavulanate  1 tablet Oral Q12H  . budesonide (PULMICORT) nebulizer solution  0.5 mg Nebulization BID  . diazepam  2 mg Oral TID  . levalbuterol  0.63 mg Nebulization TID  . nicotine  21 mg Transdermal Daily  . pantoprazole  40 mg Oral BID  . [START ON 07/27/2017] predniSONE  40 mg Oral Q breakfast  . saccharomyces boulardii  250 mg Oral BID  . venlafaxine  75 mg Oral BID   Continuous Infusions: . dextrose 30 mL/hr at 07/23/17 1233  . magnesium sulfate 1 - 4 g bolus IVPB       Principal Problem:   AKI (acute kidney injury) (HCC) Active Problems:   Abdominal pain   Colitis   COPD (chronic obstructive pulmonary disease) (HCC)   Acute respiratory failure with hypoxia (HCC)    Time spent: 30 minutes    Vassie LollMadera, Daenerys Buttram  [facility] Pager [phone]. If 7PM-7AM, please contact night-coverage at [email or web address], password [email or web address] 07/26/2017, 4:13 PM  LOS: 8 days

## 2017-07-26 NOTE — Progress Notes (Signed)
At 0115, bed alarm was going off and patient was found by Nita SellsJohn Hall, NT on her knees beside the bed.  Patient was already on the low bed with mats in place.  Patient does have a history of dementia however the MD and her son [name] was notified, no further tests ordered.  Door was closed at the time of the fall so door is now left open for safety.  Will continue to monitor.

## 2017-07-26 NOTE — Progress Notes (Signed)
Pharmacy Note   The patient is receiving Protonix by the intravenous route.  Based on criteria approved by the Pharmacy and Therapeutics Committee and the Medical Executive Committee, the medication is being converted to the equivalent oral dose form.  These criteria include: -No active GI bleeding -Able to tolerate diet of full liquids (or better) or tube feeding -Able to tolerate other medications by the oral or enteral route  If you have any questions about this conversion, please contact the Pharmacy Department (phone [phone]).  Thank you.  Adalberto ColeNikola Cedric Mcclaine, PharmD, BCPS Pager [phone] 07/26/2017 11:41 AM

## 2017-07-26 NOTE — Progress Notes (Signed)
Patient will remain on [address]

## 2017-07-26 NOTE — Progress Notes (Signed)
CSW contacted by RN for updates on discharge planning. CSW updated that per PT eval yesterday, the patient is being recommended for SNF, and per previous CSW assessment the family is wanting the patient placed in SNF at discharge. CSW has completed referral paperwork and faxed out, but patient currently has no bed offers at this time.  CSW will follow up by contacting family to determine if they have any facility preferences, and will update medical team if a bed becomes available. However, due to decreased staff available on the weekend, patient may not be able to admit to SNF today.   Blenda NicelyElizabeth Kare Dado, LCSW Clinical Social Worker [phone] (Weekend coverage)

## 2017-07-27 DIAGNOSIS — E876 Hypokalemia: Secondary | ICD-10-CM

## 2017-07-27 DIAGNOSIS — F0391 Unspecified dementia with behavioral disturbance: Secondary | ICD-10-CM

## 2017-07-27 DIAGNOSIS — F03918 Unspecified dementia, unspecified severity, with other behavioral disturbance: Secondary | ICD-10-CM

## 2017-07-27 LAB — BASIC METABOLIC PANEL
ANION GAP: 9 (ref 5–15)
BUN: <5 mg/dL — ABNORMAL LOW (ref 6–20)
CHLORIDE: 104 mmol/L (ref 101–111)
CO2: 27 mmol/L (ref 22–32)
Calcium: 7.8 mg/dL — ABNORMAL LOW (ref 8.9–10.3)
Creatinine, Ser: 0.52 mg/dL (ref 0.44–1.00)
Glucose, Bld: 86 mg/dL (ref 65–99)
POTASSIUM: 3.1 mmol/L — AB (ref 3.5–5.1)
SODIUM: 140 mmol/L (ref 135–145)

## 2017-07-27 LAB — MAGNESIUM: MAGNESIUM: 1.7 mg/dL (ref 1.7–2.4)

## 2017-07-27 MED ORDER — PREDNISONE 10 MG PO TABS: ORAL_TABLET | ORAL | 0 refills | Status: DC

## 2017-07-27 MED ORDER — SACCHAROMYCES BOULARDII 250 MG PO CAPS: 250.0000 mg | ORAL_CAPSULE | Freq: Two times a day (BID) | ORAL | 0 refills | Status: AC

## 2017-07-27 MED ORDER — DIAZEPAM 5 MG PO TABS: 5.0000 mg | ORAL_TABLET | Freq: Three times a day (TID) | ORAL | 0 refills | Status: AC | PRN

## 2017-07-27 MED ORDER — AMOXICILLIN-POT CLAVULANATE 875-125 MG PO TABS
1.0000 | ORAL_TABLET | Freq: Two times a day (BID) | ORAL | 0 refills | Status: DC
Start: 2017-07-27 — End: 2017-08-23

## 2017-07-27 MED ORDER — NICOTINE 21 MG/24HR TD PT24: 21.0000 mg | MEDICATED_PATCH | Freq: Every day | TRANSDERMAL | 0 refills | Status: DC

## 2017-07-27 MED ORDER — PANTOPRAZOLE SODIUM 40 MG PO TBEC: 40.0000 mg | DELAYED_RELEASE_TABLET | Freq: Two times a day (BID) | ORAL | 1 refills | Status: AC

## 2017-07-27 MED ORDER — POTASSIUM CHLORIDE CRYS ER 20 MEQ PO TBCR: 40.0000 meq | EXTENDED_RELEASE_TABLET | Freq: Every day | ORAL | 1 refills | Status: AC

## 2017-07-27 NOTE — Progress Notes (Signed)
  Speech Language Pathology Treatment: Dysphagia  Patient Details Name: Danielle [name] MRN: [id number] DOB: [birth year] Today's Date: 07/27/2017 Time: [id number] SLP Time Calculation (min) (ACUTE ONLY): 20 min  Assessment / Plan / Recommendation Clinical Impression  Pt seen at bedside for assessment of diet tolerance. Pt currently receiving dys 3 (mech soft) diet and thin liquids. Pt accepted minimal amounts of her lunch (one bite of each item), as well as several sips of thin liquid. No overt s/s aspiration were observed on any consistency presented. RN reports po intake has been significantly low for several days.   Recommend consideration of supplements with and/or between meals to increase nutrition/hydration.   HPI HPI: 71 yo [facility] with weakness, decreased po intake, watery diarrhea and emesis.  Found to have AKI - severe sepsis/shock due to suspected treating duodenitis.  Pt KUB showed bowel gas.  Pt PMH + for COPD, GIB, esophagitis, encephalopathy, HLD, dementia, CVA, and obesity.  Pt is not to be scoped per pt report to GI.  Swallow evaluation ordered, pt started on sips of clears and to advance as tolerated.  Pt chest imaging negative for CHF infiltrate.        SLP Plan  Continue with current plan of care       Recommendations  Diet recommendations: Dysphagia 3 (mechanical soft);Thin liquid Liquids provided via: Straw;Cup Medication Administration: Whole meds with puree Supervision: Staff to assist with self feeding Compensations: Minimize environmental distractions;Slow rate;Small sips/bites Postural Changes and/or Swallow Maneuvers: Seated upright 90 degrees;Upright 30-60 min after meal                Oral Care Recommendations: Oral care BID Follow up Recommendations: 24 hour supervision/assistance SLP Visit Diagnosis: Dysphagia, oropharyngeal phase (R13.12) Plan: Continue with current plan of care       GO              Jonai Weyland B. Murvin NatalBueche, [facility],  CCC-SLP Speech Language Pathologist [phone]  Leigh AuroraBueche, Vaishali Baise Brown 07/27/2017, 12:30 PM

## 2017-07-27 NOTE — Progress Notes (Signed)
IV dc'd and foley dc'd. 400 ml discarded from foley bag. [facility].

## 2017-07-27 NOTE — Progress Notes (Signed)
Pt returning [facility]. Report#[id number]. Provided pt's DC summary/FL2 to facility. Facility representative confirmed received. Pt will transport via [facility]- CSW completed medical necessity form and arranaged transportation. Updated pt's SIL Allen of pending transfer to back [facility] at [facility]. Pt's scripts and out-of-facility DNR along with printed DC summary and FL2 copies provided in transfer packet.  Ilean SkillMeghan Aleni Andrus, MSW, LCSW Clinical Social Work 07/27/2017 [phone]

## 2017-07-27 NOTE — Progress Notes (Addendum)
CSW following for disposition. Pt from [facility] (has resided there approximately a year) and wants to return. PT evaluation recommends SNF and weekend CSW made referrals, however after speaking with pt's family and pt today, currently pt is declining SNF and wants to return to her ALF at DC. Son-in-law reports family would ideally want pt to go to [facility] as recommended however defer to pt's wishes. CSW spoke with ALF liaison who will meet with pt today and assess if ALF can manage her current needs considering possibility of adding HHPT to her care at ALF (facility uses [facility]). Will follow.  Sharren Bridge, MSW, LCSW Clinical Social Work 07/27/2017 [phone]  [facility] met with pt and feels pt's care needs can be met at ALF at DC. Pt open to HHPT/RN which facility needs orders for at DC. Updated pt's son-in-law, he is agreeable to plan for pt to return to [facility] with added home health care. Will complete FL2 with DC medications once pt ready for DC today- will provide DC information to facility and assist with transition back to ALF.

## 2017-07-27 NOTE — Progress Notes (Signed)
OT Cancellation Note  Patient Details Name: Danielle [name] MRN: [id number] DOB: 10/09/1945   Cancelled Treatment:    Reason Eval/Treat Not Completed: Other (comment)Noted plan for SNF- will defer OT eval to SNF   [facility]RED[facility]ING, Dorena BodoLorraine D  Lori Maxon Kresse, [place] [phone] 07/27/2017, 9:57 AM

## 2017-07-27 NOTE — Discharge Summary (Signed)
Physician Discharge Summary  Francelia Mclaren [facility]:[id number] DOB: 1946-04-03 DOA: 07/17/2017  PCP: Patient, No Pcp Per  Admit date: 07/17/2017 Discharge date: 07/27/2017  Time spent: 35 minutes  Recommendations for Outpatient Follow-up:  Repeat BMET in 5 days to follow electrolytes and renal function  Repeat CBC in 5 days to follow Hgb and WBC's trend  Follow patient BP and adjust antihypertensive regimen as needed   Discharge Diagnoses:  Principal Problem:   AKI (acute kidney injury) ([place]) Active Problems:   Abdominal pain   Colitis   COPD (chronic obstructive pulmonary disease) ([place])   Acute respiratory failure with hypoxia ([place])   Hypokalemia   Dementia with behavioral disturbance   Discharge Condition: stable and improved. Will discharge to ALF with [facility] services. Follow up with PCP in 10 days.  Diet recommendation: dysphagia 3 with thin liquids   Filed Weights   07/24/17 0655 07/25/17 1000 07/26/17 0419  Weight: 81 kg (178 lb 9.6 oz) 73.9 kg (163 lb) 70.8 kg (156 lb)    History of present illness:  71 y.o. female with medical history significant for CVA,, Intra-cranial hrr, who presented to the Ed from SNF complaints of abdominal pain, weakness, of 2 days' duration.patient's blood pressure was noted to be 70/52, pt, was subsequently sent to the ED, he was given IV fluid bolus and routes by EMS.  Abdominal pain- mostly lower abdomen. Patient denies dysuria.  Patient was constipated yesterday, was given a laxative with good bowel movement, the patient reports to me that she has had several episodes of loose stools today, and 2 episodes of vomiting. Patient denies shortness of breath or cough, no chest pain, no documented fevers or chills at ALF.  Hospital Course:  1-Severe sepsis/septic shock -Appears to be secondary to duodenitis and colitis -Per GI recommendations no endoscopic interventions planned or needed at this point. -diet advance to soft diet and so far well  tolerated; even patient appetite is overall poor -Repeat CT scan of her abdomen demonstrated some improvement in her ongoing intestinal inflammation. -WBC's were trending down; now up probably from use of steroids. -will continue PO antibiotics for 4 more days at discharge  2-acute blood loss anemia/bloody stools -Status post 1 unit of blood transfusion on 10/29 with appropriate post-transfusion response. -Current hemoglobin has remains stable -will recommend CBC to follow Hgb trend -Continue PPI -Patient not interested in any endoscopic evaluation -no further signs of active bleeding   3-sinus tachycardia -HR much better controlled -most likely Associated with sepsis and anemia -improved and controlled at discharge  4-acute kidney injury -Secondary to prerenal azotemia and hypotension with decreased perfusion in the setting of sepsis. -Improved/resolved with IV fluids resuscitation -Will recommend BMET at follow up to assess renal function trend.  5-abdominal pain/diarrhea/ileus -Due to duodenitis and sigmoiditis -C. difficile tested and negative -Continue current antibiotics and supportive care -Repeated CT scan done on 10/30 first with improvement in her intestinal inflammatory response. -Per GI recommendations and following patient's wishes no endoscopic intervention is planned. -Patient tolerating diet.  Follow clinical response.  6-acute respiratory failure with hypoxia/COPD exacerbation -Appears to be secondary to decrease in lung expansion from abd distension and overall tachypnea from sepsis and COPD exacerbation. -will continue BD's, resume symbicort at discharge and continue steroids tapering   -will continue supportive care -no home oxygen needed on evaluation.   7-acute metabolic and toxic encephalopathy  -Improve and essentially back to baseline. -Patient able to follow simple commands and is in no distress. -Continue supportive care  and constant  reorientation.  8-history of depression/anxiety -No suicidal ideation or hallucinations -nursing staff reported patient mentation fluctuating and experiencing episodes of agitation. -Will continue Effexor and continue Valium. -most likely associated with underlying dementia with behavioral disturbances  9-hypernatremia -Resolved with use of D5 solution -will recommend BMET at follow up to monitor electrolytes trend -advise to follow good oral hydration and good nutrition.  10-hypokalemia -Appears to be associated with the GI losses -Follow basic metabolic panel and magnesium level in 5 days to assess trend; further replete them as needed.  11-class 1 obesity -low calorie diet and increase physical activity mentioned to patient  -Body mass index is 25.96 kg/m.  12-dysphagia -will use dysphagia 3 and thin liquids -appreciate assistance and recommendations from speech therapy   Procedures:  See below for x-ray reports   Consultations:  GI  PCCM  Discharge Exam: Vitals:   07/27/17 0455 07/27/17 0839  BP: (!) 145/90   Pulse: (!) 110   Resp: 16   Temp: 97.9 F (36.6 C)   SpO2: 100% 93%      General: Alert, awake and oriented x2, no chest pain, no nausea, no vomiting.  Reports some improvement in loose stools and expressed that her breathing is a lot better..    Cardiovascular: Regular rate, no rubs, no gallops, no murmur, no JVD on exam.   Respiratory: Improved air movement bilaterally, positive for scattered rhonchi, no wheezing, no crackles appreciated on exam today.  Patient no requiring oxygen supplementation at discharge.   Abdomen: Positive bowel sounds, still with mild tenderness in her left lower quadrant with deep palpation, no guarding, no distention.    Musculoskeletal: No edema, no cyanosis, no clubbing.       Discharge Instructions   Discharge Instructions    Diet - low sodium heart healthy   Complete by:  As directed    Discharge  instructions   Complete by:  As directed    Take medications as prescribed  Maintain adequate hydration  Repeat BMET in 5 days to follow electrolytes and renal function  Follow clinical response and follow recommendations from physical therapy for rehabilitation program.     Current Discharge Medication List    START taking these medications   Details  amoxicillin-clavulanate (AUGMENTIN) 875-125 MG tablet Take 1 tablet every 12 (twelve) hours by mouth. Qty: 8 tablet, Refills: 0    nicotine (NICODERM CQ - DOSED IN MG/24 HOURS) 21 mg/24hr patch Place 1 patch (21 mg total) daily onto the skin. Qty: 28 patch, Refills: 0    pantoprazole (PROTONIX) 40 MG tablet Take 1 tablet (40 mg total) 2 (two) times daily by mouth. Qty: 60 tablet, Refills: 1    potassium chloride SA (K-DUR,KLOR-CON) 20 MEQ tablet Take 2 tablets (40 mEq total) daily by mouth. Qty: 30 tablet, Refills: 1    predniSONE (DELTASONE) 10 MG tablet Take 3 tablets by mouth daily X 2 day; then 2 tablets by mouth daily X 3 days; then 1 tablet by mouth daily X 3 days and stop prednisone. Qty: 15 tablet, Refills: 0    saccharomyces boulardii (FLORASTOR) 250 MG capsule Take 1 capsule (250 mg total) 2 (two) times daily by mouth. Qty: 60 capsule, Refills: 0      CONTINUE these medications which have CHANGED   Details  diazepam (VALIUM) 5 MG tablet Take 1 tablet (5 mg total) every 8 (eight) hours as needed by mouth for anxiety. Qty: 20 tablet, Refills: 0      CONTINUE  these medications which have NOT CHANGED   Details  albuterol (PROVENTIL HFA;VENTOLIN HFA) 108 (90 Base) MCG/ACT inhaler Inhale 1 puff into the lungs daily as needed for wheezing or shortness of breath.     budesonide-formoterol (SYMBICORT) 160-4.5 MCG/ACT inhaler Inhale 2 puffs into the lungs 2 (two) times daily.    nystatin (NYSTATIN) powder Apply 1 g topically 2 (two) times daily as needed (for rash).    oxyCODONE (OXY IR/ROXICODONE) 5 MG immediate release  tablet Take 1 tablet (5 mg total) by mouth every 6 (six) hours as needed for breakthrough pain. Qty: 10 tablet, Refills: 0    trolamine salicylate (ARTHRICREAM) 10 % cream Apply 1 application topically 2 (two) times daily as needed for muscle pain. May self administer    venlafaxine (EFFEXOR) 75 MG tablet Take 75 mg by mouth 2 (two) times daily.    vitamin B-12 (CYANOCOBALAMIN) 500 MCG tablet Take 500 mcg by mouth daily with breakfast.       STOP taking these medications     loperamide (IMODIUM) 2 MG capsule        No Known Allergies   The results of significant diagnostics from this hospitalization (including imaging, microbiology, ancillary and laboratory) are listed below for reference.    Significant Diagnostic Studies: Ct Chest Wo Contrast  Result Date: 07/18/2017 CLINICAL DATA:  Leukocytosis. Weakness and abdominal symptoms. No reported fever or cough. Sepsis workup. EXAM: CT CHEST WITHOUT CONTRAST TECHNIQUE: Multidetector CT imaging of the chest was performed following the standard protocol without IV contrast. COMPARISON:  Portable chest 07/17/2017.  CTs 05/14/2017. FINDINGS: Cardiovascular: Again demonstrated is extensive atherosclerosis of the aorta, great vessels and coronary arteries. No acute vascular findings are seen on noncontrast imaging. The heart size is normal. There is no pericardial effusion. Mediastinum/Nodes: There are no enlarged mediastinal, hilar or axillary lymph nodes.Hilar assessment is limited by the lack of intravenous contrast, although the hilar contours appear unchanged. The thyroid gland, trachea and esophagus demonstrate no significant findings. There is a small fat containing hiatal hernia. Lungs/Pleura: There is no pleural effusion. Stable mild atelectasis or scarring in the lingula and right upper lobe. There are calcified left lower lobe granulomas. No suspicious pulmonary nodule or confluent airspace opacity. Upper abdomen: Hepatic low density  consistent with steatosis. Aortic and branch vessel atherosclerosis. No acute findings. Musculoskeletal/Chest wall: Multiple thoracic compression deformities are again noted, unchanged from previous CT. These involve the T4, T5, T6 and T9 vertebral bodies. No acute osseous findings or chest wall masses identified. IMPRESSION: 1. No acute findings or explanation for the patient's symptoms. Stable appearance of the lungs with mild atelectasis or scarring. No infiltrate. 2. Extensive atherosclerosis, including Aortic Atherosclerosis (ICD10-I70.0). 3. Stable thoracic compression deformities. Electronically Signed   By: Richardean Sale M.D.   On: 07/18/2017 10:39   Ct Abdomen Pelvis W Contrast  Result Date: 07/21/2017 CLINICAL DATA:  Abdominal pain, unspecified. Patient admitted with sepsis and bloody stool. EXAM: CT ABDOMEN AND PELVIS WITH CONTRAST TECHNIQUE: Multidetector CT imaging of the abdomen and pelvis was performed using the standard protocol following bolus administration of intravenous contrast. CONTRAST:  100 cc Isovue-300 IV COMPARISON:  Most recent CT 4 days prior 07/17/2017 FINDINGS: Lower chest: Calcified granuloma in the left lower lobe. Remote right rib fractures. Scattered linear atelectasis. No pleural fluid. Coronary artery calcifications are incidentally noted. Hepatobiliary: Mild breathing motion artifact through the liver are. Allowing for this, no focal hepatic lesion. Gallbladder physiologically distended, no calcified stone. No biliary dilatation. Pancreas:  Breathing motion artifact through the pancreatic head in the region of peripancreatic stranding limiting assessment. No definite progression from prior. No pancreatic ductal dilatation. Spleen: Normal in size without focal abnormality. Adrenals/Urinary Tract: No adrenal nodule. Motion artifact through the kidneys. No hydronephrosis. Symmetric excretion on delayed phase imaging. Urinary bladder is decompressed by Foley catheter and not  well evaluated. Stomach/Bowel: Small hiatal hernia. Stomach distended with enteric contrast, no gastric wall thickening. Breathing motion artifact through the and duodenum in the region of previous stranding, no definite progression, there is likely some improvement from prior. Limited enteric contrast is only reached the proximal jejunum. Proximal small bowel diffusely fluid-filled and prominent caliber measuring 3.1 cm. No abrupt transition, gradual transition to decompressed distal small bowel noted in the right lower abdomen. Limited assessment of the more distal small bowel due to motion and nondistention. There is mild mesenteric edema in the central mesentery. Decreased stool burden from prior exam. Previous sigmoid colonic wall thickening has improved, wall thickening of the distal sigmoid persists. Mild persistent pericolonic edema about the distal sigmoid. Vascular/Lymphatic: Dense aortic atherosclerosis. Normal caliber IVC. Few prominent inguinal nodes are likely reactive. Similar lymph nodes adjacent to the gastroesophageal junction. Reproductive: Status post hysterectomy. No adnexal masses. Other: Small amount of free fluid in the pelvis and right lower abdomen. Increased body wall edema in the flanks. No free air or intra-abdominal abscess. Musculoskeletal: Chronic T9 compression fracture. Multilevel degenerative change throughout spine. No new osseous abnormality. IMPRESSION: 1. Improvement in pericolonic edema about the sigmoid colon from recent exam, with residual distal sigmoid wall thickening and soft tissue stranding. 2. Likely improved soft tissue stranding about the duodenum and head of the pancreas, region partially obscured by motion. 3. Progressive fluid-filled small bowel distention, ileus or enteritis are considered as potential etiologies. Small amount of free fluid in the pelvis and right lower abdomen, likely reactive. 4. Exam is otherwise unchanged. Aortic and branch atherosclerosis,  moderate to advanced. Aortic Atherosclerosis (ICD10-I70.0). Electronically Signed   By: Jeb Levering M.D.   On: 07/21/2017 21:37   Dg Chest Port 1 View  Result Date: 07/20/2017 CLINICAL DATA:  Dyspnea, asthma, current smoker. EXAM: PORTABLE CHEST 1 VIEW COMPARISON:  07/18/2017 CT chest and 07/17/2017 CXR FINDINGS: The heart size is borderline enlarged with aortic atherosclerosis. The patient is slightly rotated on current exam. No pneumothorax or pulmonary consolidation. No significant effusion nor overt pulmonary edema. Emphysematous hyperinflation of the lungs, upper lobe predominant. Remote right eighth rib fracture. High-riding humeral heads consistent with chronic rotator cuff tears. Glenohumeral joint osteoarthritis with spurring off the inferomedial aspect of both humeral heads. IMPRESSION: 1. Emphysematous hyperinflation of the lungs. No acute pulmonary consolidation or CHF. 2. Stable cardiomegaly with aortic atherosclerosis. 3. Remote right eighth rib fracture. 4. Osteoarthritis of both glenohumeral joints. Electronically Signed   By: Ashley Royalty M.D.   On: 07/20/2017 14:14   Dg Chest Port 1 View  Result Date: 07/17/2017 CLINICAL DATA:  Weakness.  Cough.  Shortness of breath.  Smoker. EXAM: PORTABLE CHEST 1 VIEW COMPARISON:  Chest CT dated 05/14/2017. Chest radiographs dated 05/13/2017. FINDINGS: Borderline enlarged cardiac silhouette. Clear lungs with normal vascularity. Old, healed right eighth rib fracture. Diffuse osteopenia. IMPRESSION: No acute abnormality. Electronically Signed   By: Claudie Revering M.D.   On: 07/17/2017 22:08   Dg Abd Portable 1v  Result Date: 07/20/2017 CLINICAL DATA:  Abdominal pain.  Bloody stools. EXAM: PORTABLE ABDOMEN - 1 VIEW COMPARISON:  CT scan abdomen dated 07/17/2017 FINDINGS: There  is increased air in the bowel since the prior study but there are no distended bowel loops. No fecal impaction. No visible free air or free fluid. No acute bone abnormality.  Degenerative disc disease in the lumbar spine. IMPRESSION: No significant abnormality. Overall increased air in the bowel since the prior exam. Electronically Signed   By: Lorriane Shire M.D.   On: 07/20/2017 14:18   Ct Renal Stone Study  Result Date: 07/17/2017 CLINICAL DATA:  Hypotension abdominal pain EXAM: CT ABDOMEN AND PELVIS WITHOUT CONTRAST TECHNIQUE: Multidetector CT imaging of the abdomen and pelvis was performed following the standard protocol without IV contrast. COMPARISON:  05/18/2017, 05/14/2017 FINDINGS: Lower chest: Lung bases demonstrate a calcified subpleural left lower lobe lung nodule unchanged. No acute consolidation or pleural effusion. Coronary calcification. Hepatobiliary: No focal hepatic abnormality. Dilated gallbladder without acute inflammation or calcified stone. No biliary dilatation Pancreas: Suggestion of mild soft tissue stranding and edema near head of pancreas. No ductal dilatation Spleen: Normal in size without focal abnormality. Adrenals/Urinary Tract: Adrenal glands are unremarkable. Kidneys are normal, without renal calculi, focal lesion, or hydronephrosis. Bladder is unremarkable. Stomach/Bowel: Small hiatal hernia. No dilated small bowel. Indistinct appearance of duodenum with mild soft tissue stranding in the region. Large amount of stool within the colon. Non identified appendix. Mild formed feces in the rectosigmoid colon. Edema/vascular congestion within the pericolonic fat of the rectosigmoid colon with suggestion of mild wall thickening of a segment of sigmoid colon, similar distribution compared to prior CT. Vascular/Lymphatic: Moderate atherosclerosis. No aneurysmal dilatation. Similar small lymph node at the GE junction. No pelvic adenopathy. Reproductive: Status post hysterectomy. No adnexal masses. Other: No free air or significant free fluid. Small fat in the umbilicus. Musculoskeletal: Degenerative changes. Chronic compression fracture at T8. IMPRESSION: 1.  Mild edema/soft tissue stranding at the proximal duodenum/near head of pancreas. Findings could relate to duodenitis and or pancreatitis. Suggest correlation with enzymes. 2. Mild wall thickening of the sigmoid colon with hazy edema/inflammation in the pericolonic fat surrounding the rectosigmoid colon. Findings could be secondary to a mild colitis of infectious or inflammatory etiology, ischemic colitis may also be considered given history of hypotension. Relatively large volume of stool in the colon and rectum. 3. Negative for hydronephrosis, intrarenal stone or ureteral stone. 4. Chronic compression fracture at T8 Electronically Signed   By: Donavan Foil M.D.   On: 07/17/2017 23:03    Microbiology: Recent Results (from the past 240 hour(s))  Blood Culture (routine x 2)     Status: None   Collection Time: 07/17/17  9:50 PM  Result Value Ref Range Status   Specimen Description BLOOD LEFT WRIST  Final   Special Requests IN PEDIATRIC BOTTLE Blood Culture adequate volume  Final   Culture   Final    NO GROWTH 5 DAYS Performed at [facility], [address], [place], [place] [address]    Report Status 07/23/2017 FINAL  Final  Blood Culture (routine x 2)     Status: None   Collection Time: 07/17/17  9:54 PM  Result Value Ref Range Status   Specimen Description BLOOD LEFT ANTECUBITAL  Final   Special Requests   Final    BOTTLES DRAWN AEROBIC AND ANAEROBIC Blood Culture adequate volume   Culture   Final    NO GROWTH 5 DAYS Performed at [facility], [place] [address], [place], [place] [address]    Report Status 07/23/2017 FINAL  Final  C difficile quick scan w PCR  reflex     Status: None   Collection Time: 07/18/17 12:52 AM  Result Value Ref Range Status   C Diff antigen NEGATIVE NEGATIVE Final   C Diff toxin NEGATIVE NEGATIVE Final   C Diff interpretation No C. difficile detected.  Final  MRSA PCR Screening     Status: None   Collection Time: 07/18/17  6:34 AM  Result Value Ref  Range Status   MRSA by PCR NEGATIVE NEGATIVE Final    Comment:        The GeneXpert MRSA Assay (FDA approved for NASAL specimens only), is one component of a comprehensive MRSA colonization surveillance program. It is not intended to diagnose MRSA infection nor to guide or monitor treatment for MRSA infections.   Gastrointestinal Panel by PCR , Stool     Status: None   Collection Time: 07/18/17  1:32 PM  Result Value Ref Range Status   Campylobacter species NOT DETECTED NOT DETECTED Final   Plesimonas shigelloides NOT DETECTED NOT DETECTED Final   Salmonella species NOT DETECTED NOT DETECTED Final   Yersinia enterocolitica NOT DETECTED NOT DETECTED Final   Vibrio species NOT DETECTED NOT DETECTED Final   Vibrio cholerae NOT DETECTED NOT DETECTED Final   Enteroaggregative E coli (EAEC) NOT DETECTED NOT DETECTED Final   Enteropathogenic E coli (EPEC) NOT DETECTED NOT DETECTED Final   Enterotoxigenic E coli (ETEC) NOT DETECTED NOT DETECTED Final   Shiga like toxin producing E coli (STEC) NOT DETECTED NOT DETECTED Final   Shigella/Enteroinvasive E coli (EIEC) NOT DETECTED NOT DETECTED Final   Cryptosporidium NOT DETECTED NOT DETECTED Final   Cyclospora cayetanensis NOT DETECTED NOT DETECTED Final   Entamoeba histolytica NOT DETECTED NOT DETECTED Final   Giardia lamblia NOT DETECTED NOT DETECTED Final   Adenovirus F40/41 NOT DETECTED NOT DETECTED Final   Astrovirus NOT DETECTED NOT DETECTED Final   Norovirus GI/GII NOT DETECTED NOT DETECTED Final   Rotavirus A NOT DETECTED NOT DETECTED Final   Sapovirus (I, II, IV, and V) NOT DETECTED NOT DETECTED Final     Labs: Basic Metabolic Panel: Recent Labs  Lab 07/21/17 0315 07/22/17 0325 07/23/17 1352 07/24/17 0440 07/26/17 1101 07/27/17 0422  NA 139 137 135 136 139 140  K 3.3* 3.9 3.0* 3.3* 2.9* 3.1*  CL 110 105 104 103 101 104  CO2 20* 21* _0 GLUCOSE 99 85 115* 131* 97 86  BUN _1 5* <5*  CREATININE 0.96  0.83 0.61 0.63 0.62 0.52  CALCIUM 7.9* 8.1* 7.6* 7.9* 8.5* 7.8*  MG 2.5* 2.2  --   --   --  1.7   CBC: Recent Labs  Lab 07/21/17 0315 07/22/17 0325 07/23/17 1352 07/24/17 0440 07/26/17 1101  WBC 23.3* 20.9* 15.7* 13.3* 14.2*  HGB 9.3* 9.9* 8.3* 8.8* 11.7*  HCT 29.4* 30.7* 25.8* 26.8* 36.0  MCV 99.3 99.7 98.9 97.5 99.4  PLT 409* 441* 451* 452* 472*   BNP (last 3 results) Recent Labs    07/17/17 2142  BNP 174.6*    Signed:  Barton Dubois MD.  [facility] 07/27/2017, 1:03 PM

## 2017-07-27 NOTE — NC FL2 (Signed)
[place] MEDICAID FL2 LEVEL OF CARE SCREENING TOOL     IDENTIFICATION  Patient Name: Danielle [name] Birthdate: 1946-02-22 Sex: female Admission Date (Current Location): 07/17/2017  [facility] and [place] Number:  Producer, television/film/video and Address:  [facility],  [address] [place], [place] [address]      Provider Number: [id number]  Attending Physician Name and Address:  Vassie Loll, MD  Relative Name and Phone Number:       Current Level of Care: Hospital Recommended Level of Care: ALF Prior Approval Number:    Date Approved/Denied:   PASRR Number: [id number] Discharge Plan: ALF   Current Diagnoses: Patient Active Problem List   Diagnosis Date Noted  . Hypokalemia   . Dementia with behavioral disturbance   . Acute respiratory failure with hypoxia (HCC)   . Abdominal pain 07/18/2017  . Colitis 07/18/2017  . COPD (chronic obstructive pulmonary disease) (HCC) 07/18/2017  . Cholelithiasis 05/16/2017  . Hyperchloremic metabolic acidosis 05/12/2017  . AKI (acute kidney injury) (HCC) 05/11/2017  . Leukocytosis 05/11/2017  . Metabolic acidosis 05/11/2017  . Lower extremity weakness 05/11/2017  . ICH (intracerebral hemorrhage) (HCC) 05/26/2014  . Hyponatremia 05/26/2014  . Acute encephalopathy 05/26/2014  . Sepsis (HCC) 05/26/2014  . CVA (cerebral vascular accident) (HCC) 05/25/2014  . Intracerebral hemorrhage (HCC) 05/25/2014    Orientation RESPIRATION BLADDER Height & Weight     Self, Situation, Place  normal External catheter(catheter placed 07/18/17) Weight: 156 lb (70.8 kg) Height:  5\' 5"  (165.1 cm)  BEHAVIORAL SYMPTOMS/MOOD NEUROLOGICAL BOWEL NUTRITION STATUS      Incontinent Diet(mechanical soft entire meal, thin liquids)  AMBULATORY STATUS COMMUNICATION OF NEEDS Skin   Extensive Assist Verbally Skin abrasions(back arm abrasion)                       Personal Care Assistance Level of Assistance  Bathing, Feeding, Dressing Bathing  Assistance: Maximum assistance Feeding assistance: independent assistance Dressing Assistance: limited assistance     Functional Limitations Info             SPECIAL CARE FACTORS FREQUENCY  PT (By licensed PT), OT (By licensed OT), Speech therapy     PT Frequency: 5x/wk OT Frequency: 5x/wk     Speech Therapy Frequency: 1x/wk      Contractures      Additional Factors Info  Code Status, Allergies, Psychotropic Code Status Info: DNR Allergies Info: NKA Psychotropic Info: Valium 2mg  3x/day; Effexor 75mg  2x/day         Current Medications (07/27/2017):  This is the current hospital active medication list Current Facility-Administered Medications  Medication Dose Route Frequency Provider Last Rate Last Dose  . amoxicillin-clavulanate (AUGMENTIN) 875-125 MG per tablet 1 tablet  1 tablet Oral Q12H Vassie Loll, MD   1 tablet at 07/27/17 1201  . budesonide (PULMICORT) nebulizer solution 0.5 mg  0.5 mg Nebulization BID Vassie Loll, MD   0.5 mg at 07/27/17 0845  . dextrose 5 % solution   Intravenous Continuous Dorothea Ogle, MD 30 mL/hr at 07/23/17 1233    . diazepam (VALIUM) tablet 2 mg  2 mg Oral TID Roslynn Amble, MD   2 mg at 07/27/17 1201  . iopamidol (ISOVUE-300) 61 % injection 15 mL  15 mL Oral Once PRN Dorothea Ogle, MD   15 mL at 07/21/17 1715  . levalbuterol (XOPENEX) nebulizer solution 0.63 mg  0.63 mg Nebulization TID Vassie Loll, MD   0.63 mg at 07/27/17 0839  .  levalbuterol (XOPENEX) nebulizer solution 1.25 mg  1.25 mg Nebulization Q6H PRN Dorothea Ogle, MD   1.25 mg at 07/23/17 2102  . magnesium sulfate IVPB 2 g 50 mL  2 g Intravenous Once Dorothea Ogle, MD      . nicotine (NICODERM CQ - dosed in mg/24 hours) patch 21 mg  21 mg Transdermal Daily Dorothea Ogle, MD   21 mg at 07/27/17 1201  . ondansetron (ZOFRAN) tablet 4 mg  4 mg Oral Q6H PRN Emokpae, Ejiroghene E, MD       Or  . ondansetron (ZOFRAN) injection 4 mg  4 mg Intravenous Q6H PRN Emokpae,  Ejiroghene E, MD   4 mg at 07/21/17 1803  . oxyCODONE (Oxy IR/ROXICODONE) immediate release tablet 5 mg  5 mg Oral Q6H PRN Emokpae, Ejiroghene E, MD   5 mg at 07/27/17 0520  . pantoprazole (PROTONIX) EC tablet 40 mg  40 mg Oral BID Vassie Loll, MD   40 mg at 07/27/17 1201  . predniSONE (DELTASONE) tablet 40 mg  40 mg Oral Q breakfast Vassie Loll, MD   40 mg at 07/27/17 0803  . saccharomyces boulardii (FLORASTOR) capsule 250 mg  250 mg Oral BID Vassie Loll, MD   250 mg at 07/27/17 1200  . venlafaxine [facility]) tablet 75 mg  75 mg Oral BID Emokpae, Ejiroghene E, MD   75 mg at 07/27/17 1201  . zolpidem (AMBIEN) tablet 5 mg  5 mg Oral QHS PRN Leda Gauze, NP   5 mg at 07/26/17 2139     Discharge Medications: Current Discharge Medication List        START taking these medications   Details  amoxicillin-clavulanate (AUGMENTIN) 875-125 MG tablet Take 1 tablet every 12 (twelve) hours by mouth. Qty: 8 tablet, Refills: 0    nicotine (NICODERM CQ - DOSED IN MG/24 HOURS) 21 mg/24hr patch Place 1 patch (21 mg total) daily onto the skin. Qty: 28 patch, Refills: 0    pantoprazole (PROTONIX) 40 MG tablet Take 1 tablet (40 mg total) 2 (two) times daily by mouth. Qty: 60 tablet, Refills: 1    potassium chloride SA (K-DUR,KLOR-CON) 20 MEQ tablet Take 2 tablets (40 mEq total) daily by mouth. Qty: 30 tablet, Refills: 1    predniSONE (DELTASONE) 10 MG tablet Take 3 tablets by mouth daily X 2 day; then 2 tablets by mouth daily X 3 days; then 1 tablet by mouth daily X 3 days and stop prednisone. Qty: 15 tablet, Refills: 0    saccharomyces boulardii (FLORASTOR) 250 MG capsule Take 1 capsule (250 mg total) 2 (two) times daily by mouth. Qty: 60 capsule, Refills: 0          CONTINUE these medications which have CHANGED   Details  diazepam (VALIUM) 5 MG tablet Take 1 tablet (5 mg total) every 8 (eight) hours as needed by mouth for anxiety. Qty: 20 tablet, Refills: 0           CONTINUE these medications which have NOT CHANGED   Details  albuterol (PROVENTIL HFA;VENTOLIN HFA) 108 (90 Base) MCG/ACT inhaler Inhale 1 puff into the lungs daily as needed for wheezing or shortness of breath.     budesonide-formoterol (SYMBICORT) 160-4.5 MCG/ACT inhaler Inhale 2 puffs into the lungs 2 (two) times daily.    nystatin (NYSTATIN) powder Apply 1 g topically 2 (two) times daily as needed (for rash).    oxyCODONE (OXY IR/ROXICODONE) 5 MG immediate release tablet Take 1 tablet (5 mg  total) by mouth every 6 (six) hours as needed for breakthrough pain. Qty: 10 tablet, Refills: 0    trolamine salicylate (ARTHRICREAM) 10 % cream Apply 1 application topically 2 (two) times daily as needed for muscle pain. May self administer    venlafaxine (EFFEXOR) 75 MG tablet Take 75 mg by mouth 2 (two) times daily.    vitamin B-12 (CYANOCOBALAMIN) 500 MCG tablet Take 500 mcg by mouth daily with breakfast.        Relevant Imaging Results:  Relevant Lab Results:   Additional Information SS#: [id number]. Needs HHPT/OT/RN to follow at facility  [facility]eghan R Jessabelle Markiewicz, LCSW

## 2017-08-21 ENCOUNTER — Encounter (HOSPITAL_COMMUNITY): Payer: Self-pay | Admitting: Emergency Medicine

## 2017-08-21 ENCOUNTER — Emergency Department (HOSPITAL_COMMUNITY): Payer: Medicare Other

## 2017-08-21 ENCOUNTER — Emergency Department (HOSPITAL_COMMUNITY)
Admission: EM | Admit: 2017-08-21 | Discharge: 2017-08-21 | Disposition: A | Discharge status: Home / Self Care | Payer: Medicare Other | Source: Home / Self Care | Attending: Emergency Medicine | Admitting: Emergency Medicine

## 2017-08-21 DIAGNOSIS — Y999 Unspecified external cause status: Secondary | ICD-10-CM

## 2017-08-21 DIAGNOSIS — F0390 Unspecified dementia without behavioral disturbance: Secondary | ICD-10-CM | POA: Insufficient documentation

## 2017-08-21 DIAGNOSIS — R0602 Shortness of breath: Secondary | ICD-10-CM | POA: Diagnosis not present

## 2017-08-21 DIAGNOSIS — J449 Chronic obstructive pulmonary disease, unspecified: Secondary | ICD-10-CM | POA: Insufficient documentation

## 2017-08-21 DIAGNOSIS — S098XXA Other specified injuries of head, initial encounter: Secondary | ICD-10-CM | POA: Insufficient documentation

## 2017-08-21 DIAGNOSIS — I10 Essential (primary) hypertension: Secondary | ICD-10-CM | POA: Insufficient documentation

## 2017-08-21 DIAGNOSIS — Z79899 Other long term (current) drug therapy: Secondary | ICD-10-CM

## 2017-08-21 DIAGNOSIS — Z23 Encounter for immunization: Secondary | ICD-10-CM | POA: Insufficient documentation

## 2017-08-21 DIAGNOSIS — Y929 Unspecified place or not applicable: Secondary | ICD-10-CM

## 2017-08-21 DIAGNOSIS — M79601 Pain in right arm: Secondary | ICD-10-CM

## 2017-08-21 DIAGNOSIS — W19XXXA Unspecified fall, initial encounter: Secondary | ICD-10-CM

## 2017-08-21 DIAGNOSIS — A419 Sepsis, unspecified organism: Secondary | ICD-10-CM | POA: Diagnosis not present

## 2017-08-21 DIAGNOSIS — Y9389 Activity, other specified: Secondary | ICD-10-CM

## 2017-08-21 DIAGNOSIS — F17210 Nicotine dependence, cigarettes, uncomplicated: Secondary | ICD-10-CM

## 2017-08-21 DIAGNOSIS — W06XXXA Fall from bed, initial encounter: Secondary | ICD-10-CM

## 2017-08-21 MED ORDER — TETANUS-DIPHTH-ACELL PERTUSSIS 5-2.5-18.5 LF-MCG/0.5 IM SUSP
0.5000 mL | Freq: Once | INTRAMUSCULAR | Status: AC
  Administered 2017-08-21: 0.5 mL via INTRAMUSCULAR
  Filled 2017-08-21: qty 0.5

## 2017-08-21 NOTE — ED Triage Notes (Signed)
Per EMS[place] [facility] Had a witnessed fall by staff, pt rolled out of bed and hit her head on the night stand then onto the floor. No LOC. A&Ox3 which is pt's baseline. Not on blood thinners. No abnormalities or abrasions noted to head. No neck/back pain upon palpation. Pt reporting mid back pain en route to hospital but denies pain at this time. Pt has skin tear to rt forearm and rt shoulder. EMS VS BP 110/70, HR 70, R 18, CBG 111. Pt is a DNR.

## 2017-08-21 NOTE — ED Notes (Signed)
Patient transported to CT 

## 2017-08-21 NOTE — Discharge Instructions (Signed)
You were seen here today for fall. You exam was reassuring and your CT scans were as well. Please follow up with your primary care doctor this week. Your tetanus was also updated today.   Get help right away if: You have: A very bad (severe) headache that is not helped by medicine. Trouble walking or weakness in your arms and legs. Clear or bloody fluid coming from your nose or ears. Changes in your seeing (vision). Jerky movements that you cannot control (seizure). You throw up (vomit). Your symptoms get worse. You lose balance. Your speech is slurred. You pass out. You are sleepier and have trouble staying awake. The black centers of your eyes (pupils) change in size.

## 2017-08-21 NOTE — ED Notes (Signed)
Pt's skin tears cleaned and dressed with bandages.

## 2017-08-21 NOTE — ED Provider Notes (Signed)
[facility] EMERGENCY DEPARTMENT Provider Note   CSN: [id number] Arrival date & time: 08/21/17  1746     History   Chief Complaint Chief Complaint  Patient presents with  . Fall    HPI Danielle [name] is a 71 y.o. female with a history of COPD, dementia, hypertension who presents the emergency department today for witnessed fall.  Patient is from [facility] assisted living where she had a witnessed fall by staff, rolling out of bed and hitting her head on the nightstand and then falling to the floor.  No reported loss of consciousness.  Patient is not on anticoagulation therapy. She is now complaining of pain where she has 2 skin tears on the right lower arm and right shoulder.  Denies headache, nausea, vomiting or other arthralgias. Tetanus not up to date.   HPI  Past Medical History:  Diagnosis Date  . Anemia   . Anxiety   . Arthritis    "some; all over" (05/29/2014)  . Asthma   . Chronic airway obstruction (HCC)   . COPD (chronic obstructive pulmonary disease) (HCC)   . Dementia   . Depression   . Encephalopathy   . Esophagitis   . Essential hypertension   . GERD (gastroesophageal reflux disease)   . GIB (gastrointestinal bleeding)   . HLD (hyperlipidemia)   . Hypopotassemia   . Hyposmolality and/or hyponatremia   . Insomnia   . Tobacco abuse     Patient Active Problem List   Diagnosis Date Noted  . Hypokalemia   . Dementia with behavioral disturbance   . Acute respiratory failure with hypoxia (HCC)   . Abdominal pain 07/18/2017  . Colitis 07/18/2017  . COPD (chronic obstructive pulmonary disease) (HCC) 07/18/2017  . Cholelithiasis 05/16/2017  . Hyperchloremic metabolic acidosis 05/12/2017  . AKI (acute kidney injury) (HCC) 05/11/2017  . Leukocytosis 05/11/2017  . Metabolic acidosis 05/11/2017  . Lower extremity weakness 05/11/2017  . ICH (intracerebral hemorrhage) (HCC) 05/26/2014  . Hyponatremia 05/26/2014  . Acute encephalopathy  05/26/2014  . Sepsis (HCC) 05/26/2014  . CVA (cerebral vascular accident) (HCC) 05/25/2014  . Intracerebral hemorrhage (HCC) 05/25/2014    Past Surgical History:  Procedure Laterality Date  . ABDOMINAL HYSTERECTOMY    . APPENDECTOMY    . CHOLECYSTECTOMY    . TONSILLECTOMY    . TUBAL LIGATION      OB History    No data available       Home Medications    Prior to Admission medications   Medication Sig Start Date End Date Taking? Authorizing Provider  albuterol (PROVENTIL HFA;VENTOLIN HFA) 108 (90 Base) MCG/ACT inhaler Inhale 1 puff into the lungs daily as needed for wheezing or shortness of breath.     [provider]  amoxicillin-clavulanate (AUGMENTIN) 875-125 MG tablet Take 1 tablet every 12 (twelve) hours by mouth. 07/27/17   Vassie Loll, MD  budesonide-formoterol [facility]) 160-4.5 MCG/ACT inhaler Inhale 2 puffs into the lungs 2 (two) times daily.    [provider]  diazepam (VALIUM) 5 MG tablet Take 1 tablet (5 mg total) every 8 (eight) hours as needed by mouth for anxiety. 07/27/17   Vassie Loll, MD  nicotine (NICODERM CQ - DOSED IN MG/24 HOURS) 21 mg/24hr patch Place 1 patch (21 mg total) daily onto the skin. 07/28/17   Vassie Loll, MD  nystatin (NYSTATIN) powder Apply 1 g topically 2 (two) times daily as needed (for rash).    [provider]  oxyCODONE (OXY IR/ROXICODONE) 5  MG immediate release tablet Take 1 tablet (5 mg total) by mouth every 6 (six) hours as needed for breakthrough pain. 05/19/17   Narda BondsNettey, Ralph A, MD  pantoprazole (PROTONIX) 40 MG tablet Take 1 tablet (40 mg total) 2 (two) times daily by mouth. 07/27/17   Vassie LollMadera, Carlos, MD  potassium chloride SA (K-DUR,KLOR-CON) 20 MEQ tablet Take 2 tablets (40 mEq total) daily by mouth. 07/27/17   Vassie LollMadera, Carlos, MD  predniSONE (DELTASONE) 10 MG tablet Take 3 tablets by mouth daily X 2 day; then 2 tablets by mouth daily X 3 days; then 1 tablet by mouth daily X 3 days and stop prednisone.  07/27/17   Vassie LollMadera, Carlos, MD  saccharomyces boulardii (FLORASTOR) 250 MG capsule Take 1 capsule (250 mg total) 2 (two) times daily by mouth. 07/27/17   Vassie LollMadera, Carlos, MD  trolamine salicylate (ARTHRICREAM) 10 % cream Apply 1 application topically 2 (two) times daily as needed for muscle pain. May self administer    [provider]  venlafaxine (EFFEXOR) 75 MG tablet Take 75 mg by mouth 2 (two) times daily.    [provider]  vitamin B-12 (CYANOCOBALAMIN) 500 MCG tablet Take 500 mcg by mouth daily with breakfast.     [provider]    Family History Family History  Problem Relation Age of Onset  . CVA Mother     Social History Social History   Tobacco Use  . Smoking status: Current Every Day Smoker    Packs/day: 0.25    Years: 50.00    Pack years: 12.50    Types: Cigarettes  . Smokeless tobacco: Never Used  Substance Use Topics  . Alcohol use: No  . Drug use: No     Allergies   Patient has no known allergies.   Review of Systems Review of Systems  All other systems reviewed and are negative.    Physical Exam Updated Vital Signs BP 101/86   Pulse 84   Temp 97.7 F (36.5 C) (Oral)   Resp (!) 21   Ht 5\' 5"  (1.651 m)   Wt 68 kg (150 lb)   SpO2 96%   BMI 24.96 kg/m   Physical Exam  Constitutional: She appears well-developed and well-nourished.  Non-toxic appearing  HENT:  Head: Normocephalic and atraumatic. Head is without raccoon's eyes and without Battle's sign.  Right Ear: Hearing, tympanic membrane, external ear and ear canal normal. Tympanic membrane is not perforated and not erythematous. No hemotympanum.  Left Ear: Hearing, tympanic membrane, external ear and ear canal normal. Tympanic membrane is not perforated and not erythematous. No hemotympanum.  Nose: Nose normal. No rhinorrhea. Right sinus exhibits no maxillary sinus tenderness and no frontal sinus tenderness. Left sinus exhibits no maxillary sinus tenderness and no  frontal sinus tenderness.  Mouth/Throat: Uvula is midline, oropharynx is clear and moist and mucous membranes are normal. No tonsillar exudate.  No CSF otorrhea.  No palpable open depressed skull fracture.  Eyes: Conjunctivae, EOM and lids are normal. Pupils are equal, round, and reactive to light. Right eye exhibits no discharge. Left eye exhibits no discharge. Right conjunctiva is not injected. Left conjunctiva is not injected. No scleral icterus. Right eye exhibits normal extraocular motion and no nystagmus. Left eye exhibits normal extraocular motion and no nystagmus.  Neck: Trachea normal and phonation normal. Neck supple. Spinous process tenderness (C7) present. No muscular tenderness present. No neck rigidity. No tracheal deviation present.  Patient with towel around her neck.  Cardiovascular: Normal rate, regular rhythm,  normal heart sounds and intact distal pulses.  No murmur heard. Pulses:      Radial pulses are 2+ on the right side, and 2+ on the left side.       Dorsalis pedis pulses are 2+ on the right side, and 2+ on the left side.       Posterior tibial pulses are 2+ on the right side, and 2+ on the left side.  No lower extremity swelling or edema. Calves symmetric in size bilaterally.  Pulmonary/Chest: Effort normal. No respiratory distress. She exhibits no tenderness.  Abdominal: Soft. Bowel sounds are normal. There is no tenderness. There is no rebound and no guarding.  Musculoskeletal: She exhibits no edema.  Grossly moves all major joints without difficulty or pain. No thoracic or lumbar spinous ttp or step offs.  Lymphadenopathy:    She has no cervical adenopathy.  Neurological: She is alert. She has normal strength and normal reflexes. No cranial nerve deficit or sensory deficit. She displays a negative Romberg sign. Coordination and gait normal.  Reflex Scores:      Bicep reflexes are 2+ on the right side and 2+ on the left side.      Patellar reflexes are 2+ on the right  side and 2+ on the left side.      Achilles reflexes are 2+ on the right side and 2+ on the left side. Mental Status: Alert, oriented, thought content appropriate, able to give a coherent history. Speech fluent without evidence of aphasia. Able to follow 2 step commands without difficulty. Cranial Nerves: II: Peripheral visual fields grossly normal, pupils equal, round, reactive to light III,IV, VI: ptosis not present, extra-ocular motions intact bilaterally V,VII: smile symmetric, eyebrows raise symmetric, facial light touch sensation equal VIII: hearing grossly normal to voice X: uvula elevates symmetrically XI: bilateral shoulder shrug symmetric and strong XII: midline tongue extension without fassiculations Motor: Normal tone. Able strength of the upper and lower extremities bilaterally including equal grip strength and dorsiflexion/plantar flexion. Patient able to demonstrate hip flexion b/l against gravity and resistance.  Sensory: Sensation intact to light touch in all extremities.  Deep Tendon Reflexes: 2+ and symmetric in the biceps and patella Cerebellar: normal finger-to-nose with bilateral upper extremities. No pronator drift.  Gait: Patient able to stand and demonstrate able gait with very small, shuffling steps.  CV: distal pulses palpable throughout  Skin: Skin is warm and dry. No rash noted. She is not diaphoretic. No pallor.  1cm skin tear to right shoulder. 4cm x 5cm skin tear to right outer forearm, just distal to elbow.   Psychiatric: She has a normal mood and affect.  Nursing note and vitals reviewed.    ED Treatments / Results  Labs (all labs ordered are listed, but only abnormal results are displayed) Labs Reviewed - No data to display  EKG  EKG Interpretation None       Radiology Ct Head Wo Contrast  Result Date: 08/21/2017 CLINICAL DATA:  Larey SeatFell out of bed and hit her head on the night stand today. EXAM: CT HEAD WITHOUT CONTRAST CT CERVICAL  SPINE WITHOUT CONTRAST TECHNIQUE: Multidetector CT imaging of the head and cervical spine was performed following the standard protocol without intravenous contrast. Multiplanar CT image reconstructions of the cervical spine were also generated. COMPARISON:  Head CT and brain MR dated 05/11/2017. FINDINGS: CT HEAD FINDINGS Brain: Diffusely enlarged ventricles and subarachnoid spaces. Patchy white matter low density in both cerebral hemispheres. Old right basal ganglia and thalamic lacunar infarcts.  No intracranial hemorrhage, mass lesion or CT evidence of acute infarction. Vascular: No hyperdense vessel or unexpected calcification. Skull: Normal. Negative for fracture or focal lesion. Sinuses/Orbits: Mild left maxillary sinus mucosal thickening. Unremarkable orbits. Other: None. CT CERVICAL SPINE FINDINGS Alignment: Reversal of the normal lordosis in the lower cervical spine. No subluxations. Skull base and vertebrae: Motion artifacts with no fractures seen. Soft tissues and spinal canal: No prevertebral fluid or swelling. No visible canal hematoma. Disc levels: Minimal anterior posterior spur formation at the C5-6 level mild disc space narrowing. Upper chest: Clear lung apices. Other: Bilateral carotid artery calcification. IMPRESSION: 1. No skull fracture or intracranial hemorrhage. 2. No cervical spine fracture or subluxation. 3. Stable diffuse cerebral atrophy, chronic small vessel white matter ischemic changes in both cerebral hemispheres and old right basal ganglia and thalamic lacunar infarcts. 4. Bilateral carotid artery atheromatous calcifications. Electronically Signed   By: Beckie Salts M.D.   On: 08/21/2017 20:16   Ct Cervical Spine Wo Contrast  Result Date: 08/21/2017 CLINICAL DATA:  Larey Seat out of bed and hit her head on the night stand today. EXAM: CT HEAD WITHOUT CONTRAST CT CERVICAL SPINE WITHOUT CONTRAST TECHNIQUE: Multidetector CT imaging of the head and cervical spine was performed following  the standard protocol without intravenous contrast. Multiplanar CT image reconstructions of the cervical spine were also generated. COMPARISON:  Head CT and brain MR dated 05/11/2017. FINDINGS: CT HEAD FINDINGS Brain: Diffusely enlarged ventricles and subarachnoid spaces. Patchy white matter low density in both cerebral hemispheres. Old right basal ganglia and thalamic lacunar infarcts. No intracranial hemorrhage, mass lesion or CT evidence of acute infarction. Vascular: No hyperdense vessel or unexpected calcification. Skull: Normal. Negative for fracture or focal lesion. Sinuses/Orbits: Mild left maxillary sinus mucosal thickening. Unremarkable orbits. Other: None. CT CERVICAL SPINE FINDINGS Alignment: Reversal of the normal lordosis in the lower cervical spine. No subluxations. Skull base and vertebrae: Motion artifacts with no fractures seen. Soft tissues and spinal canal: No prevertebral fluid or swelling. No visible canal hematoma. Disc levels: Minimal anterior posterior spur formation at the C5-6 level mild disc space narrowing. Upper chest: Clear lung apices. Other: Bilateral carotid artery calcification. IMPRESSION: 1. No skull fracture or intracranial hemorrhage. 2. No cervical spine fracture or subluxation. 3. Stable diffuse cerebral atrophy, chronic small vessel white matter ischemic changes in both cerebral hemispheres and old right basal ganglia and thalamic lacunar infarcts. 4. Bilateral carotid artery atheromatous calcifications. Electronically Signed   By: Beckie Salts M.D.   On: 08/21/2017 20:16    Procedures Procedures (including critical care time)  Medications Ordered in ED Medications  Tdap (BOOSTRIX) injection 0.5 mL (not administered)     Initial Impression / Assessment and Plan / ED Course  I have reviewed the triage vital signs and the nursing notes.  Pertinent labs & imaging results that were available during my care of the patient were reviewed by me and considered in my  medical decision making (see chart for details).     71 y.o. female with witnessed mechanical fall. Patient is not on anticoagulation therapy. No LOC.  Patient complains over 2 areas where she has skin tears.  No focal neurologic deficits on exam.  Tetanus updated and patient's wounds cleansed and dressed.  CT obtained of head and neck.  No acute findings. Patient can ambulate in the department. She is to follow up with PCP. She appears safe for discharge back to AL facility. Patient case seen and discussed with Dr. Ranae Palms who is in  agreement with plan.   Final Clinical Impressions(s) / ED Diagnoses   Final diagnoses:  Fall, initial encounter    ED Discharge Orders    None       Princella Pellegrini 08/21/17 2033    Loren Racer, MD 09/11/17 1220

## 2017-08-23 ENCOUNTER — Emergency Department (HOSPITAL_COMMUNITY): Payer: Medicare Other

## 2017-08-23 ENCOUNTER — Inpatient Hospital Stay (HOSPITAL_COMMUNITY): Payer: Medicare Other

## 2017-08-23 ENCOUNTER — Inpatient Hospital Stay (HOSPITAL_COMMUNITY)
Admission: EM | Admit: 2017-08-23 | Discharge: 2017-09-22 | DRG: 871 | Disposition: E | Payer: Medicare Other | Attending: Internal Medicine | Admitting: Internal Medicine

## 2017-08-23 ENCOUNTER — Encounter (HOSPITAL_COMMUNITY): Payer: Self-pay

## 2017-08-23 DIAGNOSIS — J440 Chronic obstructive pulmonary disease with acute lower respiratory infection: Secondary | ICD-10-CM | POA: Diagnosis present

## 2017-08-23 DIAGNOSIS — Z79891 Long term (current) use of opiate analgesic: Secondary | ICD-10-CM

## 2017-08-23 DIAGNOSIS — I10 Essential (primary) hypertension: Secondary | ICD-10-CM | POA: Diagnosis present

## 2017-08-23 DIAGNOSIS — N390 Urinary tract infection, site not specified: Secondary | ICD-10-CM | POA: Diagnosis present

## 2017-08-23 DIAGNOSIS — Z66 Do not resuscitate: Secondary | ICD-10-CM | POA: Diagnosis present

## 2017-08-23 DIAGNOSIS — Z79899 Other long term (current) drug therapy: Secondary | ICD-10-CM | POA: Diagnosis not present

## 2017-08-23 DIAGNOSIS — Z23 Encounter for immunization: Secondary | ICD-10-CM

## 2017-08-23 DIAGNOSIS — G9340 Encephalopathy, unspecified: Secondary | ICD-10-CM | POA: Diagnosis present

## 2017-08-23 DIAGNOSIS — W19XXXD Unspecified fall, subsequent encounter: Secondary | ICD-10-CM | POA: Diagnosis present

## 2017-08-23 DIAGNOSIS — F1010 Alcohol abuse, uncomplicated: Secondary | ICD-10-CM | POA: Diagnosis present

## 2017-08-23 DIAGNOSIS — Y9389 Activity, other specified: Secondary | ICD-10-CM | POA: Diagnosis not present

## 2017-08-23 DIAGNOSIS — D649 Anemia, unspecified: Secondary | ICD-10-CM | POA: Diagnosis not present

## 2017-08-23 DIAGNOSIS — Z7189 Other specified counseling: Secondary | ICD-10-CM

## 2017-08-23 DIAGNOSIS — R0602 Shortness of breath: Secondary | ICD-10-CM

## 2017-08-23 DIAGNOSIS — F419 Anxiety disorder, unspecified: Secondary | ICD-10-CM | POA: Diagnosis not present

## 2017-08-23 DIAGNOSIS — J9600 Acute respiratory failure, unspecified whether with hypoxia or hypercapnia: Secondary | ICD-10-CM | POA: Diagnosis present

## 2017-08-23 DIAGNOSIS — F17210 Nicotine dependence, cigarettes, uncomplicated: Secondary | ICD-10-CM | POA: Diagnosis present

## 2017-08-23 DIAGNOSIS — R6521 Severe sepsis with septic shock: Secondary | ICD-10-CM | POA: Diagnosis not present

## 2017-08-23 DIAGNOSIS — Z823 Family history of stroke: Secondary | ICD-10-CM | POA: Diagnosis not present

## 2017-08-23 DIAGNOSIS — Z7951 Long term (current) use of inhaled steroids: Secondary | ICD-10-CM

## 2017-08-23 DIAGNOSIS — I959 Hypotension, unspecified: Secondary | ICD-10-CM | POA: Diagnosis not present

## 2017-08-23 DIAGNOSIS — Z90710 Acquired absence of both cervix and uterus: Secondary | ICD-10-CM

## 2017-08-23 DIAGNOSIS — J441 Chronic obstructive pulmonary disease with (acute) exacerbation: Secondary | ICD-10-CM | POA: Diagnosis present

## 2017-08-23 DIAGNOSIS — J189 Pneumonia, unspecified organism: Secondary | ICD-10-CM | POA: Diagnosis not present

## 2017-08-23 DIAGNOSIS — W06XXXA Fall from bed, initial encounter: Secondary | ICD-10-CM | POA: Diagnosis not present

## 2017-08-23 DIAGNOSIS — A419 Sepsis, unspecified organism: Principal | ICD-10-CM | POA: Diagnosis present

## 2017-08-23 DIAGNOSIS — K529 Noninfective gastroenteritis and colitis, unspecified: Secondary | ICD-10-CM | POA: Diagnosis present

## 2017-08-23 DIAGNOSIS — Z8673 Personal history of transient ischemic attack (TIA), and cerebral infarction without residual deficits: Secondary | ICD-10-CM | POA: Diagnosis not present

## 2017-08-23 DIAGNOSIS — R579 Shock, unspecified: Secondary | ICD-10-CM | POA: Diagnosis not present

## 2017-08-23 DIAGNOSIS — F329 Major depressive disorder, single episode, unspecified: Secondary | ICD-10-CM | POA: Diagnosis present

## 2017-08-23 DIAGNOSIS — E870 Hyperosmolality and hypernatremia: Secondary | ICD-10-CM | POA: Diagnosis not present

## 2017-08-23 DIAGNOSIS — R1310 Dysphagia, unspecified: Secondary | ICD-10-CM | POA: Diagnosis present

## 2017-08-23 DIAGNOSIS — R0682 Tachypnea, not elsewhere classified: Secondary | ICD-10-CM | POA: Diagnosis not present

## 2017-08-23 DIAGNOSIS — N179 Acute kidney failure, unspecified: Secondary | ICD-10-CM | POA: Diagnosis present

## 2017-08-23 DIAGNOSIS — F0390 Unspecified dementia without behavioral disturbance: Secondary | ICD-10-CM | POA: Diagnosis present

## 2017-08-23 DIAGNOSIS — E861 Hypovolemia: Secondary | ICD-10-CM | POA: Diagnosis present

## 2017-08-23 DIAGNOSIS — Z515 Encounter for palliative care: Secondary | ICD-10-CM | POA: Diagnosis present

## 2017-08-23 DIAGNOSIS — F0391 Unspecified dementia with behavioral disturbance: Secondary | ICD-10-CM

## 2017-08-23 DIAGNOSIS — F03918 Unspecified dementia, unspecified severity, with other behavioral disturbance: Secondary | ICD-10-CM | POA: Diagnosis present

## 2017-08-23 DIAGNOSIS — M1990 Unspecified osteoarthritis, unspecified site: Secondary | ICD-10-CM | POA: Diagnosis not present

## 2017-08-23 DIAGNOSIS — M79601 Pain in right arm: Secondary | ICD-10-CM | POA: Diagnosis present

## 2017-08-23 DIAGNOSIS — G92 Toxic encephalopathy: Secondary | ICD-10-CM | POA: Diagnosis not present

## 2017-08-23 DIAGNOSIS — G9341 Metabolic encephalopathy: Secondary | ICD-10-CM | POA: Diagnosis present

## 2017-08-23 DIAGNOSIS — K219 Gastro-esophageal reflux disease without esophagitis: Secondary | ICD-10-CM | POA: Diagnosis present

## 2017-08-23 DIAGNOSIS — E785 Hyperlipidemia, unspecified: Secondary | ICD-10-CM | POA: Diagnosis present

## 2017-08-23 DIAGNOSIS — S098XXA Other specified injuries of head, initial encounter: Secondary | ICD-10-CM | POA: Diagnosis present

## 2017-08-23 DIAGNOSIS — E876 Hypokalemia: Secondary | ICD-10-CM | POA: Diagnosis present

## 2017-08-23 LAB — URINALYSIS, ROUTINE W REFLEX MICROSCOPIC
Bilirubin Urine: NEGATIVE
GLUCOSE, UA: NEGATIVE mg/dL
Ketones, ur: NEGATIVE mg/dL
NITRITE: NEGATIVE
PH: 5 (ref 5.0–8.0)
PROTEIN: 30 mg/dL — AB
RBC / HPF: NONE SEEN RBC/hpf (ref 0–5)
Specific Gravity, Urine: 1.015 (ref 1.005–1.030)

## 2017-08-23 LAB — CBC WITH DIFFERENTIAL/PLATELET
Basophils Absolute: 0 10*3/uL (ref 0.0–0.1)
Basophils Relative: 0 %
Eosinophils Absolute: 0 10*3/uL (ref 0.0–0.7)
Eosinophils Relative: 0 %
HCT: 28.9 % — ABNORMAL LOW (ref 36.0–46.0)
Hemoglobin: 9.1 g/dL — ABNORMAL LOW (ref 12.0–15.0)
Lymphocytes Relative: 10 %
Lymphs Abs: 1.2 10*3/uL (ref 0.7–4.0)
MCH: 31.3 pg (ref 26.0–34.0)
MCHC: 31.5 g/dL (ref 30.0–36.0)
MCV: 99.3 fL (ref 78.0–100.0)
Monocytes Absolute: 0.9 10*3/uL (ref 0.1–1.0)
Monocytes Relative: 7 %
Neutro Abs: 10 10*3/uL — ABNORMAL HIGH (ref 1.7–7.7)
Neutrophils Relative %: 83 %
Platelets: 336 10*3/uL (ref 150–400)
RBC: 2.91 MIL/uL — ABNORMAL LOW (ref 3.87–5.11)
RDW: 16.1 % — ABNORMAL HIGH (ref 11.5–15.5)
WBC: 12.1 10*3/uL — ABNORMAL HIGH (ref 4.0–10.5)

## 2017-08-23 LAB — COMPREHENSIVE METABOLIC PANEL
ALT: 17 U/L (ref 14–54)
AST: 29 U/L (ref 15–41)
Albumin: 2.1 g/dL — ABNORMAL LOW (ref 3.5–5.0)
Alkaline Phosphatase: 122 U/L (ref 38–126)
Anion gap: 9 (ref 5–15)
BILIRUBIN TOTAL: 0.2 mg/dL — AB (ref 0.3–1.2)
BUN: 17 mg/dL (ref 6–20)
CO2: 19 mmol/L — ABNORMAL LOW (ref 22–32)
CREATININE: 2.54 mg/dL — AB (ref 0.44–1.00)
Calcium: 7.6 mg/dL — ABNORMAL LOW (ref 8.9–10.3)
Chloride: 109 mmol/L (ref 101–111)
GFR calc Af Amer: 21 mL/min — ABNORMAL LOW (ref >60)
GFR, EST NON AFRICAN AMERICAN: 18 mL/min — AB (ref >60)
Glucose, Bld: 96 mg/dL (ref 65–99)
Potassium: 3.5 mmol/L (ref 3.5–5.1)
Sodium: 137 mmol/L (ref 135–145)
TOTAL PROTEIN: 4.9 g/dL — AB (ref 6.5–8.1)

## 2017-08-23 LAB — PROTIME-INR
INR: 1.15
Prothrombin Time: 14.6 s (ref 11.4–15.2)

## 2017-08-23 LAB — LIPASE, BLOOD: Lipase: 17 U/L (ref 11–51)

## 2017-08-23 LAB — TYPE AND SCREEN
ABO/RH(D): A POS
ANTIBODY SCREEN: NEGATIVE

## 2017-08-23 LAB — POC OCCULT BLOOD, ED: Fecal Occult Bld: NEGATIVE

## 2017-08-23 LAB — I-STAT CG4 LACTIC ACID, ED: Lactic Acid, Venous: 1.35 mmol/L (ref 0.5–1.9)

## 2017-08-23 LAB — TROPONIN I: Troponin I: <0.03 ng/mL

## 2017-08-23 LAB — ABO/RH: ABO/RH(D): A POS

## 2017-08-23 MED ORDER — ACETAMINOPHEN 325 MG PO TABS
650.0000 mg | ORAL_TABLET | Freq: Four times a day (QID) | ORAL | Status: DC | PRN
  Filled 2017-08-23: qty 2

## 2017-08-23 MED ORDER — LACTATED RINGERS IV BOLUS (SEPSIS): 1000.0000 mL | Freq: Once | INTRAVENOUS | Status: DC

## 2017-08-23 MED ORDER — VENLAFAXINE HCL 75 MG PO TABS: 75.0000 mg | ORAL_TABLET | Freq: Two times a day (BID) | ORAL | Status: DC

## 2017-08-23 MED ORDER — PANTOPRAZOLE SODIUM 40 MG PO TBEC: 40.0000 mg | DELAYED_RELEASE_TABLET | Freq: Two times a day (BID) | ORAL | Status: DC

## 2017-08-23 MED ORDER — DIAZEPAM 5 MG PO TABS: 5.0000 mg | ORAL_TABLET | ORAL | Status: DC

## 2017-08-23 MED ORDER — HEPARIN SODIUM (PORCINE) 5000 UNIT/ML IJ SOLN
5000.0000 [IU] | Freq: Three times a day (TID) | INTRAMUSCULAR | Status: DC
  Administered 2017-08-24 – 2017-08-26 (×7): 5000 [IU] via SUBCUTANEOUS
  Filled 2017-08-23 (×7): qty 1

## 2017-08-23 MED ORDER — VANCOMYCIN HCL IN DEXTROSE 1-5 GM/200ML-% IV SOLN: 1000.0000 mg | INTRAVENOUS | Status: DC

## 2017-08-23 MED ORDER — QUETIAPINE FUMARATE 25 MG PO TABS: 25.0000 mg | ORAL_TABLET | Freq: Two times a day (BID) | ORAL | Status: DC

## 2017-08-23 MED ORDER — SACCHAROMYCES BOULARDII 250 MG PO CAPS
250.0000 mg | ORAL_CAPSULE | Freq: Two times a day (BID) | ORAL | Status: DC
Start: 2017-08-23 — End: 2017-08-24

## 2017-08-23 MED ORDER — ACETAMINOPHEN 325 MG PO TABS: 650.0000 mg | ORAL_TABLET | Freq: Three times a day (TID) | ORAL | Status: DC

## 2017-08-23 MED ORDER — POLYSACCHARIDE IRON COMPLEX 150 MG PO CAPS: 150.0000 mg | ORAL_CAPSULE | Freq: Every day | ORAL | Status: DC

## 2017-08-23 MED ORDER — SODIUM CHLORIDE 0.9 % IV BOLUS (SEPSIS)
1000.0000 mL | Freq: Once | INTRAVENOUS | Status: AC
  Administered 2017-08-23: 1000 mL via INTRAVENOUS

## 2017-08-23 MED ORDER — ENOXAPARIN SODIUM 40 MG/0.4ML ~~LOC~~ SOLN: 40.0000 mg | SUBCUTANEOUS | Status: DC

## 2017-08-23 MED ORDER — ALBUTEROL SULFATE (2.5 MG/3ML) 0.083% IN NEBU
3.0000 mL | INHALATION_SOLUTION | Freq: Every day | RESPIRATORY_TRACT | Status: DC | PRN
  Administered 2017-08-25: 3 mL via RESPIRATORY_TRACT
  Filled 2017-08-23: qty 3

## 2017-08-23 MED ORDER — LACTATED RINGERS IV BOLUS (SEPSIS)
1000.0000 mL | Freq: Once | INTRAVENOUS | Status: AC
  Administered 2017-08-23: 1000 mL via INTRAVENOUS

## 2017-08-23 MED ORDER — ACETAMINOPHEN 650 MG RE SUPP
650.0000 mg | Freq: Four times a day (QID) | RECTAL | Status: DC | PRN
  Administered 2017-08-25: 650 mg via RECTAL
  Filled 2017-08-23: qty 1

## 2017-08-23 MED ORDER — FLUTICASONE FUROATE-VILANTEROL 200-25 MCG/INH IN AEPB
1.0000 | INHALATION_SPRAY | Freq: Every day | RESPIRATORY_TRACT | Status: DC
  Administered 2017-08-25: 1 via RESPIRATORY_TRACT
  Filled 2017-08-23: qty 28

## 2017-08-23 MED ORDER — SODIUM CHLORIDE 0.9 % IV BOLUS (SEPSIS): 1000.0000 mL | Freq: Once | INTRAVENOUS | Status: DC

## 2017-08-23 MED ORDER — ATORVASTATIN CALCIUM 10 MG PO TABS: 10.0000 mg | ORAL_TABLET | Freq: Every day | ORAL | Status: DC

## 2017-08-23 MED ORDER — PIPERACILLIN-TAZOBACTAM 3.375 G IVPB 30 MIN
3.3750 g | Freq: Once | INTRAVENOUS | Status: AC
  Administered 2017-08-23: 3.375 g via INTRAVENOUS
  Filled 2017-08-23: qty 50

## 2017-08-23 MED ORDER — VANCOMYCIN HCL IN DEXTROSE 1-5 GM/200ML-% IV SOLN
1000.0000 mg | Freq: Once | INTRAVENOUS | Status: AC
  Administered 2017-08-23 (×2): 1000 mg via INTRAVENOUS
  Filled 2017-08-23: qty 200

## 2017-08-23 MED ORDER — PIPERACILLIN-TAZOBACTAM IN DEX 2-0.25 GM/50ML IV SOLN
2.2500 g | Freq: Three times a day (TID) | INTRAVENOUS | Status: DC
  Administered 2017-08-23 – 2017-08-24 (×2): 2.25 g via INTRAVENOUS
  Filled 2017-08-23 (×3): qty 50

## 2017-08-23 MED ORDER — ONDANSETRON HCL 4 MG PO TABS: 4.0000 mg | ORAL_TABLET | Freq: Four times a day (QID) | ORAL | Status: DC | PRN

## 2017-08-23 MED ORDER — VITAMIN B-12 500 MCG PO TABS: 500.0000 ug | ORAL_TABLET | Freq: Every day | ORAL | Status: DC

## 2017-08-23 MED ORDER — ONDANSETRON HCL 4 MG/2ML IJ SOLN: 4.0000 mg | Freq: Four times a day (QID) | INTRAMUSCULAR | Status: DC | PRN

## 2017-08-23 NOTE — ED Notes (Signed)
2nd blood culture drawn  Zosyn will be started

## 2017-08-23 NOTE — ED Notes (Signed)
To ct

## 2017-08-23 NOTE — ED Notes (Signed)
Waiting on blood culture draw to start the antibiotics

## 2017-08-23 NOTE — ED Notes (Signed)
Pt moving around and moaning more

## 2017-08-23 NOTE — ED Notes (Signed)
Pt had dark green stool stuck to her bottom mthrough to her vulva bruises rt flank and rt hip  New in color she has a growth blocking most of her rectum and her vagina vulva etc  Cath urine performed after extensive cleaning pur-wick applied new linen placed.  The pt follows some commands but other she does not answer or follow  No family present iv to run in wo from rt a-c

## 2017-08-23 NOTE — ED Notes (Signed)
Son in law has called he will be here in the next 2 hours

## 2017-08-23 NOTE — ED Notes (Signed)
Family at bedside speaking with Dr. Julian ReilGardner

## 2017-08-23 NOTE — ED Notes (Signed)
The pts bp is in the 60s again  Dr Fredderick Phenixbelfi notified   Order for 500 nn bolus started

## 2017-08-23 NOTE — ED Notes (Signed)
Vancomycin started in c-t  Will need to wait for the pt to return from c-t to document  Correct time

## 2017-08-23 NOTE — Progress Notes (Signed)
RT attempted x2 RR to obtain ABG without success. MD notified and ok to d/c at this time.

## 2017-08-23 NOTE — ED Notes (Signed)
Pt transferred onto inpt stretcher for comfort, pt now opening eyes with attempts at communicating, asking to get up. Small amount black stool noted, pt rolled, cleaned, full linen change.  Pure wik in place, no urine output at this time. tegaderm placed over skin tear to R elbow. Barrier cream place in gluteal fold d/t redness.  Per Dr. Julian ReilGardner he called and spoke with family, he reports they will be coming to see patient.

## 2017-08-23 NOTE — ED Notes (Signed)
Admitting at bedside 

## 2017-08-23 NOTE — ED Notes (Signed)
2nd l\53000ml bolus I nfused  bp 84/44  Dr Fredderick Phenixbelfi notified  3rd 500ml bolus ordered

## 2017-08-23 NOTE — Consult Note (Signed)
Name: Danielle BudVickie [name] MRN: [id number] DOB: 05-31-1946    ADMISSION DATE:  08/27/2017 CONSULTATION DATE:  09/04/2017  REFERRING MD :  Dr. Fredderick PhenixBelfi   CHIEF COMPLAINT:  Hypotension/AMS  HISTORY OF PRESENT ILLNESS:   71 year old female whom resides at SNF has PMH of CVA with ICH, Severe Depression, COPD/Asthma, ETOH, Anemia, Colitis, dementia. Recent admission 10/26-11/05 for sepsis secondary to duodenitis and colitis. Recent fall 11/30.   Patient presented 12/2 with lethargy. At baseline patient is ambulatory and communicative, however confused. Staff reports that patient has refused to eat or drink anything for the last 3-4 days. Daughter reports that patient has had severe depression for all of her adult life. Here recently will refuse care/medical treatments and hide her medications in her bed. Upon arrival to ED systolic 70-80, CT Head Negative, CT A/P with ongoing colitis. Given 2.5 L of NS and remained hypotensive. WBC 12.1, LA 1.35, Creat 2.54, BUN 17. PCCM asked to consult.   SIGNIFICANT EVENTS  12/2 > Presents to ED   STUDIES:  CXR 12/2 > Stable cardiomegaly and chronic interstitial changes CT Head 12/2 > No acute intracranial abnormality. No fracture or traumatic malalignment in the cervical spine. CT A/P 12/2 > No evidence of traumatic injury or other acute findings on this noncontrast exam. Persistent mild colitis involving the descending and rectosigmoid colon. No evidence of abscess or other complication XRAY Hip/Shoulder 12/2 > No acute    PAST MEDICAL HISTORY :   has a past medical history of Anemia, Anxiety, Arthritis, Asthma, Chronic airway obstruction (HCC), COPD (chronic obstructive pulmonary disease) (HCC), Dementia, Depression, Encephalopathy, Esophagitis, Essential hypertension, GERD (gastroesophageal reflux disease), GIB (gastrointestinal bleeding), HLD (hyperlipidemia), Hypopotassemia, Hyposmolality and/or hyponatremia, Insomnia, and Tobacco abuse.  has a past surgical  history that includes Tonsillectomy; Appendectomy; Cholecystectomy; Abdominal hysterectomy; and Tubal ligation. Prior to Admission medications   Medication Sig Start Date End Date Taking? Authorizing Provider  acetaminophen (TYLENOL) 325 MG tablet Take 650 mg by mouth 3 (three) times daily.   Yes [provider]  albuterol (PROVENTIL HFA;VENTOLIN HFA) 108 (90 Base) MCG/ACT inhaler Inhale 1 puff into the lungs daily as needed for wheezing or shortness of breath.    Yes [provider]  atorvastatin (LIPITOR) 10 MG tablet Take 10 mg by mouth daily at 6 PM.   Yes [provider]  budesonide-formoterol (SYMBICORT) 160-4.5 MCG/ACT inhaler Inhale 2 puffs into the lungs 2 (two) times daily.   Yes [provider]  diazepam (VALIUM) 5 MG tablet Take 1 tablet (5 mg total) every 8 (eight) hours as needed by mouth for anxiety. Patient taking differently: Take 5 mg by mouth See admin instructions. Take one tablet (5mg ) by mouth at 0800, 1600, 2000. Then take one tablet (5mg ) every eight hours as needed for agitation. 07/27/17  Yes Vassie LollMadera, Carlos, MD  iron polysaccharides (NIFEREX) 150 MG capsule Take 150 mg by mouth daily.   Yes [provider]  lisinopril (PRINIVIL,ZESTRIL) 10 MG tablet Take 10 mg by mouth daily.   Yes [provider]  metoprolol tartrate (LOPRESSOR) 25 MG tablet Take 25 mg by mouth 2 (two) times daily.   Yes [provider]  nystatin (NYSTATIN) powder Apply 1 g topically 2 (two) times daily as needed (for rash).   Yes [provider]  oxyCODONE (OXY IR/ROXICODONE) 5 MG immediate release tablet Take 1 tablet (5 mg total) by mouth every 6 (six) hours as needed for breakthrough pain. Patient taking differently: Take 5 mg by  mouth See admin instructions. Take one tablet (5mg ) by mouth at 0800, 1600, and 2000. Then take one tablet (5mg ) every six hours as needed for breakthrough pain 05/19/17  Yes Narda Bonds, MD  pantoprazole  (PROTONIX) 40 MG tablet Take 1 tablet (40 mg total) 2 (two) times daily by mouth. 07/27/17  Yes Vassie Loll, MD  potassium chloride SA (K-DUR,KLOR-CON) 20 MEQ tablet Take 2 tablets (40 mEq total) daily by mouth. 07/27/17  Yes Vassie Loll, MD  QUEtiapine (SEROQUEL) 25 MG tablet Take 25 mg by mouth 2 (two) times daily.   Yes [provider]  saccharomyces boulardii (FLORASTOR) 250 MG capsule Take 1 capsule (250 mg total) 2 (two) times daily by mouth. 07/27/17  Yes Vassie Loll, MD  venlafaxine (EFFEXOR) 75 MG tablet Take 75 mg by mouth 2 (two) times daily.   Yes [provider]  vitamin B-12 (CYANOCOBALAMIN) 500 MCG tablet Take 500 mcg by mouth daily with breakfast.    Yes [provider]  nicotine (NICODERM CQ - DOSED IN MG/24 HOURS) 21 mg/24hr patch Place 1 patch (21 mg total) daily onto the skin. Patient not taking: Reported on 2017/09/10 07/28/17   Vassie Loll, MD   No Known Allergies  FAMILY HISTORY:  family history includes CVA in her mother. SOCIAL HISTORY:  reports that she has been smoking cigarettes.  She has a 12.50 pack-year smoking history. she has never used smokeless tobacco. She reports that she does not drink alcohol or use drugs.  REVIEW OF SYSTEMS:   Unable to review as patient is encephalopathic   SUBJECTIVE:   VITAL SIGNS: Temp:  [97.5 F (36.4 C)-97.7 F (36.5 C)] 97.5 F (36.4 C) (12/02 1914) Pulse Rate:  [69-85] 85 (12/02 2100) Resp:  [12-19] 19 (12/02 2100) BP: (66-105)/(36-66) 97/58 (12/02 2100) SpO2:  [88 %-100 %] 92 % (12/02 2100)  PHYSICAL EXAMINATION: General: Chronically ill Adult female, no distress  Neuro:  Lethargic, opens eyes to verbal stimuli, does not follow commands HEENT:  Dry MM, rotted/few teeth  Cardiovascular:  RRR, no MRG  Lungs:  Clear breath sounds, no wheeze/crackles  Abdomen:  Non-distended, active bowel sounds Musculoskeletal:  +2 edema to BLE  Skin:  bruising to flank and ABD   Recent Labs  Lab  09-10-2017 1456  NA 137  K 3.5  CL 109  CO2 19*  BUN 17  CREATININE 2.54*  GLUCOSE 96   Recent Labs  Lab 2017/09/10 1456  HGB 9.1*  HCT 28.9*  WBC 12.1*  PLT 336   Ct Abdomen Pelvis Wo Contrast  Result Date: 09-10-17 CLINICAL DATA:  Recent fall. Right abdominal pain and bruising. Initial encounter. EXAM: CT ABDOMEN AND PELVIS WITHOUT CONTRAST TECHNIQUE: Multidetector CT imaging of the abdomen and pelvis was performed following the standard protocol without IV contrast. COMPARISON:  07/21/2017 FINDINGS: Lower chest: No acute findings. Hepatobiliary: No mass visualized on this unenhanced exam. Gallbladder is unremarkable. Pancreas: No mass or inflammatory process visualized on this unenhanced exam. Spleen:  Within normal limits in size. Adrenals/Urinary tract: No evidence of urolithiasis or hydronephrosis. Unremarkable unopacified urinary bladder. Stomach/Bowel: Small bowel dilatation is resolved since previous study. There is persistent mild colonic wall thickening involving the descending and rectosigmoid colon, consistent with colitis. No evidence of abscess or free fluid. Vascular/Lymphatic: No pathologically enlarged lymph nodes identified. No evidence of abdominal aortic aneurysm. Aortic atherosclerosis. Reproductive: Prior hysterectomy noted. Adnexal regions are unremarkable in appearance. Other:  No evidence of hemoperitoneum. Musculoskeletal: No acute fractures or other  suspicious bone lesions identified. Old compression fracture deformity of the T9 vertebral body is unchanged. Severe degenerative disc disease again seen at L3-4 L4-5. IMPRESSION: No evidence of traumatic injury or other acute findings on this noncontrast exam. Persistent mild colitis involving the descending and rectosigmoid colon. No evidence of abscess or other complication. Electronically Signed   By: Myles Rosenthal M.D.   On: 09/19/2017 17:53   Dg Shoulder Right  Result Date: 09/08/2017 CLINICAL DATA:  Fall several days  ago. Right shoulder pain. Initial encounter. EXAM: RIGHT SHOULDER - 2+ VIEW COMPARISON:  None. FINDINGS: There is no evidence of fracture or dislocation. Generalized osteopenia is noted. Mild glenohumeral osteoarthritis is seen. Humeral head is high-riding, consistent with chronic rotator cuff tear or atrophy. IMPRESSION: No acute findings. Electronically Signed   By: Myles Rosenthal M.D.   On: 08/28/2017 18:12   Ct Head Wo Contrast  Result Date: 08/31/2017 CLINICAL DATA:  Witnessed fall.  Altered mental status. EXAM: CT HEAD WITHOUT CONTRAST CT CERVICAL SPINE WITHOUT CONTRAST TECHNIQUE: Multidetector CT imaging of the head and cervical spine was performed following the standard protocol without intravenous contrast. Multiplanar CT image reconstructions of the cervical spine were also generated. COMPARISON:  August 21, 2017 FINDINGS: CT HEAD FINDINGS Brain: No subdural, epidural, or subarachnoid hemorrhage. The ventricles and sulci are prominent but stable. Cerebellum, brainstem, and basal cisterns are normal. Lacunar infarcts in the bilateral basal ganglia and right thalamus. Scattered white matter changes. No acute cortical ischemia or infarct. No mass effect or midline shift. Vascular: Calcified atherosclerosis in the intracranial carotids. Skull: Normal. Negative for fracture or focal lesion. Sinuses/Orbits: Mild mucosal thickening in the left maxillary sinus. Paranasal sinuses otherwise normal. There is opacification of posterior left mastoid air cells, unchanged since August 21, 2014 with no identified fracture or erosion. The mastoid air cells and middle ears are otherwise unremarkable. Other: None. CT CERVICAL SPINE FINDINGS Alignment: Normal. Skull base and vertebrae: No acute fracture. No primary bone lesion or focal pathologic process. Soft tissues and spinal canal: No prevertebral fluid or swelling. No visible canal hematoma. Disc levels:  No significant degenerative changes. Upper chest: Negative.  Other: No other abnormalities. IMPRESSION: 1. No acute intracranial abnormality. 2. No fracture or traumatic malalignment in the cervical spine. Electronically Signed   By: Gerome Sam III M.D   On: 09/13/2017 18:01   Ct Cervical Spine Wo Contrast  Result Date: 09/05/2017 CLINICAL DATA:  Witnessed fall.  Altered mental status. EXAM: CT HEAD WITHOUT CONTRAST CT CERVICAL SPINE WITHOUT CONTRAST TECHNIQUE: Multidetector CT imaging of the head and cervical spine was performed following the standard protocol without intravenous contrast. Multiplanar CT image reconstructions of the cervical spine were also generated. COMPARISON:  August 21, 2017 FINDINGS: CT HEAD FINDINGS Brain: No subdural, epidural, or subarachnoid hemorrhage. The ventricles and sulci are prominent but stable. Cerebellum, brainstem, and basal cisterns are normal. Lacunar infarcts in the bilateral basal ganglia and right thalamus. Scattered white matter changes. No acute cortical ischemia or infarct. No mass effect or midline shift. Vascular: Calcified atherosclerosis in the intracranial carotids. Skull: Normal. Negative for fracture or focal lesion. Sinuses/Orbits: Mild mucosal thickening in the left maxillary sinus. Paranasal sinuses otherwise normal. There is opacification of posterior left mastoid air cells, unchanged since August 21, 2014 with no identified fracture or erosion. The mastoid air cells and middle ears are otherwise unremarkable. Other: None. CT CERVICAL SPINE FINDINGS Alignment: Normal. Skull base and vertebrae: No acute fracture. No primary bone lesion or focal  pathologic process. Soft tissues and spinal canal: No prevertebral fluid or swelling. No visible canal hematoma. Disc levels:  No significant degenerative changes. Upper chest: Negative. Other: No other abnormalities. IMPRESSION: 1. No acute intracranial abnormality. 2. No fracture or traumatic malalignment in the cervical spine. Electronically Signed   By: Gerome Samavid   Williams III M.D   On: 08/31/2017 18:01   Dg Chest Port 1 View  Result Date: 08/22/2017 CLINICAL DATA:  Altered mental status.  Blood in mouth.  Flank pain. EXAM: PORTABLE CHEST 1 VIEW COMPARISON:  Chest radiograph July 20, 2017 FINDINGS: Cardiac silhouette is similarly enlarged. Calcified aortic knob. Mild chronic interstitial changes without pleural effusion or focal consolidation. Biapical pleural thickening. No pneumothorax. Osteopenia. Soft tissue planes are nonsuspicious. Old RIGHT posterior rib fractures. IMPRESSION: Stable cardiomegaly and chronic interstitial changes. Electronically Signed   By: Awilda Metroourtnay  Bloomer M.D.   On: 08/31/2017 15:56   Dg Hip Unilat W Or Wo Pelvis 2-3 Views Right  Result Date: 08/29/2017 CLINICAL DATA:  Fall several days ago. Right hip pain and bruising. Initial encounter. EXAM: DG HIP (WITH OR WITHOUT PELVIS) 2-3V RIGHT COMPARISON:  None. FINDINGS: There is no evidence of hip fracture or dislocation. No evidence of pelvic fracture. There is no evidence of arthropathy or other focal bone abnormality. Generalized osteopenia noted. IMPRESSION: No acute findings. Electronically Signed   By: Myles RosenthalJohn  Stahl M.D.   On: 08/22/2017 18:11    ASSESSMENT / PLAN:  Septic vs Hypovolemic Shock  -Patient appears very dry and reported refusing to eat/drink over last 3-4 days  -CT A/P with mild colitis -U/A Negative  -LA 1.35  Plan  -Cardiac Monitoring  -Ideal Systolic >90 (Currently 100)  -Follow Culture Data  -Continue Antibiotics   -Hydration as below   Acute Kidney Injury (Bas Crt .6-.8)  Plan  -Trend BMP -Replace electrolytes as indicated  -S/P 2.5L NS  -2 L LR Ordered   Patient is a DNR. Prolonged conversation with son-in-law and daughter. Given medical history, poor baseline health, and multiple admissions, family wants to avoid invasive procedures and aggressive life saving measures. Wound not want pressors or central line placement. Do want to try and continue  medical management with antibiotics and fluids. ED physician notified of code status.  Jovita KussmaulKatalina Quinta Eimer, AGACNP-BC [facility]  Pgr: [phone]  PCCM Pgr: [phone]

## 2017-08-23 NOTE — ED Notes (Signed)
Dr Fredderick Phenixbelfi notified of 1 liter of nss  infused

## 2017-08-23 NOTE — ED Notes (Signed)
The pt is  Resting the pts son-in-law has called back again he is no longer com ing he reports that he has small children and will call back for an update later.

## 2017-08-23 NOTE — H&P (Addendum)
History and Physical    Danielle [name] WJX:[id number] DOB: October 13, 1945 DOA: 09-16-2017  PCP: Patient, No Pcp Per  Patient coming from: SNF  I have personally briefly reviewed patient's old medical records in [facility]  Chief Complaint: AMS  HPI: Danielle [name] is a 71 y.o. female with medical history significant of dementia, HTN, prior GIB, COPD.  Patient presents to ED with AMS.  Unable to provide history due to AMS.  Per SNF report, at baseline she is ambulatory, communicative.  Per daughter very depressed for several years at baseline.  Noted change in mental status where she wasn't responsive this evening.  Brought in to ED.   ED Course: initial BP in the 60s, now only 70s systolic after 3L IVF.  PCCM saw patient, spoke with family.  Sounds like patient wouldn't want pressors.  Got zosyn / vanc empirically in ED.  CXR neg.  No UOP for UA.  Creat 2.5 up from 0.5 baseline.  HGB 9.1, down from 11 a month ago, hemoccult neg.  WBC 12.1.  CT abd/pelvis shows just "mild colitis".   Review of Systems: Unable to perform due to AMS  Past Medical History:  Diagnosis Date  . Anemia   . Anxiety   . Arthritis    "some; all over" (05/29/2014)  . Asthma   . Chronic airway obstruction (HCC)   . COPD (chronic obstructive pulmonary disease) (HCC)   . Dementia   . Depression   . Encephalopathy   . Esophagitis   . Essential hypertension   . GERD (gastroesophageal reflux disease)   . GIB (gastrointestinal bleeding)   . HLD (hyperlipidemia)   . Hypopotassemia   . Hyposmolality and/or hyponatremia   . Insomnia   . Tobacco abuse     Past Surgical History:  Procedure Laterality Date  . ABDOMINAL HYSTERECTOMY    . APPENDECTOMY    . CHOLECYSTECTOMY    . TONSILLECTOMY    . TUBAL LIGATION       reports that she has been smoking cigarettes.  She has a 12.50 pack-year smoking history. she has never used smokeless tobacco. She reports that she does not drink alcohol or use  drugs.  No Known Allergies  Family History  Problem Relation Age of Onset  . CVA Mother      Prior to Admission medications   Medication Sig Start Date End Date Taking? Authorizing Provider  acetaminophen (TYLENOL) 325 MG tablet Take 650 mg by mouth 3 (three) times daily.   Yes [provider]  albuterol (PROVENTIL HFA;VENTOLIN HFA) 108 (90 Base) MCG/ACT inhaler Inhale 1 puff into the lungs daily as needed for wheezing or shortness of breath.    Yes [provider]  atorvastatin (LIPITOR) 10 MG tablet Take 10 mg by mouth daily at 6 PM.   Yes [provider]  budesonide-formoterol (SYMBICORT) 160-4.5 MCG/ACT inhaler Inhale 2 puffs into the lungs 2 (two) times daily.   Yes [provider]  diazepam (VALIUM) 5 MG tablet Take 1 tablet (5 mg total) every 8 (eight) hours as needed by mouth for anxiety. Patient taking differently: Take 5 mg by mouth See admin instructions. Take one tablet (5mg ) by mouth at 0800, 1600, 2000. Then take one tablet (5mg ) every eight hours as needed for agitation. 07/27/17  Yes Vassie Loll, MD  iron polysaccharides (NIFEREX) 150 MG capsule Take 150 mg by mouth daily.   Yes [provider]  lisinopril (PRINIVIL,ZESTRIL) 10 MG tablet Take 10 mg by mouth daily.  Yes [provider]  metoprolol tartrate (LOPRESSOR) 25 MG tablet Take 25 mg by mouth 2 (two) times daily.   Yes [provider]  nystatin (NYSTATIN) powder Apply 1 g topically 2 (two) times daily as needed (for rash).   Yes [provider]  oxyCODONE (OXY IR/ROXICODONE) 5 MG immediate release tablet Take 1 tablet (5 mg total) by mouth every 6 (six) hours as needed for breakthrough pain. Patient taking differently: Take 5 mg by mouth See admin instructions. Take one tablet (5mg ) by mouth at 0800, 1600, and 2000. Then take one tablet (5mg ) every six hours as needed for breakthrough pain 05/19/17  Yes Narda Bonds, MD  pantoprazole (PROTONIX)  40 MG tablet Take 1 tablet (40 mg total) 2 (two) times daily by mouth. 07/27/17  Yes Vassie Loll, MD  potassium chloride SA (K-DUR,KLOR-CON) 20 MEQ tablet Take 2 tablets (40 mEq total) daily by mouth. 07/27/17  Yes Vassie Loll, MD  QUEtiapine (SEROQUEL) 25 MG tablet Take 25 mg by mouth 2 (two) times daily.   Yes [provider]  saccharomyces boulardii (FLORASTOR) 250 MG capsule Take 1 capsule (250 mg total) 2 (two) times daily by mouth. 07/27/17  Yes Vassie Loll, MD  venlafaxine (EFFEXOR) 75 MG tablet Take 75 mg by mouth 2 (two) times daily.   Yes [provider]  vitamin B-12 (CYANOCOBALAMIN) 500 MCG tablet Take 500 mcg by mouth daily with breakfast.    Yes [provider]    Physical Exam: Vitals:   Aug 24, 2017 1945 2017/08/24 2015 August 24, 2017 2100 08-24-2017 2130  BP: (!) 85/43 (!) 105/51 (!) 97/58 (!) 72/57  Pulse: 73 81 85 81  Resp: 14 17 19  (!) 21  Temp:      TempSrc:      SpO2: 100% 100% 92% 100%    Constitutional: Ill appearing, now awake, intermittently obeying commands and asking for water. Eyes: PERRL, lids and conjunctivae normal ENMT: Mucous membranes are moist. Posterior pharynx clear of any exudate or lesions.Normal dentition.  Neck: normal, supple, no masses, no thyromegaly Respiratory: clear to auscultation bilaterally, does have accessory muscle use with respiration. Cardiovascular: Regular rate and rhythm, no murmurs / rubs / gallops. No extremity edema. 2+ pedal pulses. No carotid bruits.  Abdomen: no tenderness, no masses palpated. No hepatosplenomegaly. Bowel sounds positive.  Musculoskeletal: no clubbing / cyanosis. No joint deformity upper and lower extremities. Good ROM, no contractures. Normal muscle tone.  Skin: no rashes, lesions, ulcers. No induration Neurologic: MAE, poor strength. Psychiatric: Intermittently asking for water, otherwise confused and unintelligible sounds.  Labs on Admission: I have personally reviewed following labs  and imaging studies  CBC: Recent Labs  Lab 24-Aug-2017 1456  WBC 12.1*  NEUTROABS 10.0*  HGB 9.1*  HCT 28.9*  MCV 99.3  PLT 336   Basic Metabolic Panel: Recent Labs  Lab 24-Aug-2017 1456  NA 137  K 3.5  CL 109  CO2 19*  GLUCOSE 96  BUN 17  CREATININE 2.54*  CALCIUM 7.6*   GFR: Estimated Creatinine Clearance: 18.8 mL/min (A) (by C-G formula based on SCr of 2.54 mg/dL (H)). Liver Function Tests: Recent Labs  Lab August 24, 2017 1456  AST 29  ALT 17  ALKPHOS 122  BILITOT 0.2*  PROT 4.9*  ALBUMIN 2.1*   Recent Labs  Lab Aug 24, 2017 1456  LIPASE 17   No results for input(s): AMMONIA in the last 168 hours. Coagulation Profile: Recent Labs  Lab 08/24/17 1456  INR 1.15   Cardiac Enzymes: Recent Labs  Lab 10/14/2016 1456  TROPONINI <0.03   BNP (last 3 results) No results for input(s): PROBNP in the last 8760 hours. HbA1C: No results for input(s): HGBA1C in the last 72 hours. CBG: No results for input(s): GLUCAP in the last 168 hours. Lipid Profile: No results for input(s): CHOL, HDL, LDLCALC, TRIG, CHOLHDL, LDLDIRECT in the last 72 hours. Thyroid Function Tests: No results for input(s): TSH, T4TOTAL, FREET4, T3FREE, THYROIDAB in the last 72 hours. Anemia Panel: No results for input(s): VITAMINB12, FOLATE, FERRITIN, TIBC, IRON, RETICCTPCT in the last 72 hours. Urine analysis:    Component Value Date/Time   COLORURINE YELLOW 2017-03-08 1609   APPEARANCEUR HAZY (A) 2017-03-08 1609   LABSPEC 1.015 2017-03-08 1609   PHURINE 5.0 2017-03-08 1609   GLUCOSEU NEGATIVE 2017-03-08 1609   HGBUR SMALL (A) 2017-03-08 1609   BILIRUBINUR NEGATIVE 2017-03-08 1609   KETONESUR NEGATIVE 2017-03-08 1609   PROTEINUR 30 (A) 2017-03-08 1609   UROBILINOGEN 0.2 05/25/2014 1855   NITRITE NEGATIVE 2017-03-08 1609   LEUKOCYTESUR TRACE (A) 2017-03-08 1609    Radiological Exams on Admission: Ct Abdomen Pelvis Wo Contrast  Result Date: 09/15/2017 CLINICAL DATA:  Recent fall. Right  abdominal pain and bruising. Initial encounter. EXAM: CT ABDOMEN AND PELVIS WITHOUT CONTRAST TECHNIQUE: Multidetector CT imaging of the abdomen and pelvis was performed following the standard protocol without IV contrast. COMPARISON:  07/21/2017 FINDINGS: Lower chest: No acute findings. Hepatobiliary: No mass visualized on this unenhanced exam. Gallbladder is unremarkable. Pancreas: No mass or inflammatory process visualized on this unenhanced exam. Spleen:  Within normal limits in size. Adrenals/Urinary tract: No evidence of urolithiasis or hydronephrosis. Unremarkable unopacified urinary bladder. Stomach/Bowel: Small bowel dilatation is resolved since previous study. There is persistent mild colonic wall thickening involving the descending and rectosigmoid colon, consistent with colitis. No evidence of abscess or free fluid. Vascular/Lymphatic: No pathologically enlarged lymph nodes identified. No evidence of abdominal aortic aneurysm. Aortic atherosclerosis. Reproductive: Prior hysterectomy noted. Adnexal regions are unremarkable in appearance. Other:  No evidence of hemoperitoneum. Musculoskeletal: No acute fractures or other suspicious bone lesions identified. Old compression fracture deformity of the T9 vertebral body is unchanged. Severe degenerative disc disease again seen at L3-4 L4-5. IMPRESSION: No evidence of traumatic injury or other acute findings on this noncontrast exam. Persistent mild colitis involving the descending and rectosigmoid colon. No evidence of abscess or other complication. Electronically Signed   By: Myles RosenthalJohn  Stahl M.D.   On: 2017-03-08 17:53   Dg Shoulder Right  Result Date: 09/13/2017 CLINICAL DATA:  Fall several days ago. Right shoulder pain. Initial encounter. EXAM: RIGHT SHOULDER - 2+ VIEW COMPARISON:  None. FINDINGS: There is no evidence of fracture or dislocation. Generalized osteopenia is noted. Mild glenohumeral osteoarthritis is seen. Humeral head is high-riding, consistent  with chronic rotator cuff tear or atrophy. IMPRESSION: No acute findings. Electronically Signed   By: Myles RosenthalJohn  Stahl M.D.   On: 2017-03-08 18:12   Ct Head Wo Contrast  Result Date: 08/22/2017 CLINICAL DATA:  Witnessed fall.  Altered mental status. EXAM: CT HEAD WITHOUT CONTRAST CT CERVICAL SPINE WITHOUT CONTRAST TECHNIQUE: Multidetector CT imaging of the head and cervical spine was performed following the standard protocol without intravenous contrast. Multiplanar CT image reconstructions of the cervical spine were also generated. COMPARISON:  August 21, 2017 FINDINGS: CT HEAD FINDINGS Brain: No subdural, epidural, or subarachnoid hemorrhage. The ventricles and sulci are prominent but stable. Cerebellum, brainstem, and basal cisterns are normal. Lacunar infarcts in the bilateral basal ganglia and right thalamus. Scattered  white matter changes. No acute cortical ischemia or infarct. No mass effect or midline shift. Vascular: Calcified atherosclerosis in the intracranial carotids. Skull: Normal. Negative for fracture or focal lesion. Sinuses/Orbits: Mild mucosal thickening in the left maxillary sinus. Paranasal sinuses otherwise normal. There is opacification of posterior left mastoid air cells, unchanged since August 21, 2014 with no identified fracture or erosion. The mastoid air cells and middle ears are otherwise unremarkable. Other: None. CT CERVICAL SPINE FINDINGS Alignment: Normal. Skull base and vertebrae: No acute fracture. No primary bone lesion or focal pathologic process. Soft tissues and spinal canal: No prevertebral fluid or swelling. No visible canal hematoma. Disc levels:  No significant degenerative changes. Upper chest: Negative. Other: No other abnormalities. IMPRESSION: 1. No acute intracranial abnormality. 2. No fracture or traumatic malalignment in the cervical spine. Electronically Signed   By: Gerome Samavid  Williams III M.D   On: 2017/06/12 18:01   Ct Cervical Spine Wo Contrast  Result Date:  09/08/2017 CLINICAL DATA:  Witnessed fall.  Altered mental status. EXAM: CT HEAD WITHOUT CONTRAST CT CERVICAL SPINE WITHOUT CONTRAST TECHNIQUE: Multidetector CT imaging of the head and cervical spine was performed following the standard protocol without intravenous contrast. Multiplanar CT image reconstructions of the cervical spine were also generated. COMPARISON:  August 21, 2017 FINDINGS: CT HEAD FINDINGS Brain: No subdural, epidural, or subarachnoid hemorrhage. The ventricles and sulci are prominent but stable. Cerebellum, brainstem, and basal cisterns are normal. Lacunar infarcts in the bilateral basal ganglia and right thalamus. Scattered white matter changes. No acute cortical ischemia or infarct. No mass effect or midline shift. Vascular: Calcified atherosclerosis in the intracranial carotids. Skull: Normal. Negative for fracture or focal lesion. Sinuses/Orbits: Mild mucosal thickening in the left maxillary sinus. Paranasal sinuses otherwise normal. There is opacification of posterior left mastoid air cells, unchanged since August 21, 2014 with no identified fracture or erosion. The mastoid air cells and middle ears are otherwise unremarkable. Other: None. CT CERVICAL SPINE FINDINGS Alignment: Normal. Skull base and vertebrae: No acute fracture. No primary bone lesion or focal pathologic process. Soft tissues and spinal canal: No prevertebral fluid or swelling. No visible canal hematoma. Disc levels:  No significant degenerative changes. Upper chest: Negative. Other: No other abnormalities. IMPRESSION: 1. No acute intracranial abnormality. 2. No fracture or traumatic malalignment in the cervical spine. Electronically Signed   By: Gerome Samavid  Williams III M.D   On: 2017/06/12 18:01   Dg Chest Port 1 View  Result Date: 08/30/2017 CLINICAL DATA:  Altered mental status.  Blood in mouth.  Flank pain. EXAM: PORTABLE CHEST 1 VIEW COMPARISON:  Chest radiograph July 20, 2017 FINDINGS: Cardiac silhouette is  similarly enlarged. Calcified aortic knob. Mild chronic interstitial changes without pleural effusion or focal consolidation. Biapical pleural thickening. No pneumothorax. Osteopenia. Soft tissue planes are nonsuspicious. Old RIGHT posterior rib fractures. IMPRESSION: Stable cardiomegaly and chronic interstitial changes. Electronically Signed   By: Awilda Metroourtnay  Bloomer M.D.   On: 2017/06/12 15:56   Dg Hip Unilat W Or Wo Pelvis 2-3 Views Right  Result Date: 09/14/2017 CLINICAL DATA:  Fall several days ago. Right hip pain and bruising. Initial encounter. EXAM: DG HIP (WITH OR WITHOUT PELVIS) 2-3V RIGHT COMPARISON:  None. FINDINGS: There is no evidence of hip fracture or dislocation. No evidence of pelvic fracture. There is no evidence of arthropathy or other focal bone abnormality. Generalized osteopenia noted. IMPRESSION: No acute findings. Electronically Signed   By: Myles RosenthalJohn  Stahl M.D.   On: 2017/06/12 18:11    EKG: Independently  reviewed.  Assessment/Plan Principal Problem:   Severe sepsis with septic shock (HCC) Active Problems:   Acute encephalopathy   Dementia with behavioral disturbance   Acute kidney failure (HCC)   Anemia    1. Shock - probably septic, but other causes also possible 1. Will give 4th L IVF 2. And leave on empiric zosyn / vanc 3. UA and BCx pending 4. However, am very dubious that this is going to work 5. Patient with evidence of kidney failure and CNS failure from hypotension already. 6. Discussed with family who are on their way in tonight.  They understand that patient is critically ill and may or may not survive the sepsis.  They confirm wish for DNR/DNI and no pressors, patient wouldn't want this. 2. Acute encephalopathy on chronic dementia - 1. Due to #1 2. Waxing and waning mental status 3. May or may not have cerebral hypoperfusion too, but other than trying to get BP up with fluids, not a whole lot that can be done 4. Continue home seroquel 5. Valium on hold  for AMS, monitor for WD signs or symptoms, may need to restart some benzos if develops 6. Oxy also on hold for the moment 7. Need to discuss goals of care (full comfort vs attempt at survival, with family when they arrive) 3. AKF - 1. Due to septic shock with hypotension 2. IVF: giving 4L bolus now 3. Repeat BMP in AM 4. Monitor for UOP 5. But BP remains low with systolics in the 70s after 3L so very worried that this may not rapidly improve here. 4. H/O HTN - hold all BP meds 5. Anemia - 1. Unclear cause 2. Hemoccult neg 3. Repeat CBC in AM  DVT prophylaxis: Lovenox Code Status: DNR/DNI, no pressors Family Communication: Spoke with family on phone, they are on way in to see patient Disposition Plan: TBD Consults called: PCCM Admission status: Admit to inpatient   Hillary Bow DO [facility] Pager [phone]  If 7AM-7PM, please contact day team taking care of patient [email or web address] Password [email or web address]  Sep 09, 2017, 9:50 PM

## 2017-08-23 NOTE — ED Notes (Signed)
pts bp lower again

## 2017-08-23 NOTE — ED Provider Notes (Signed)
[facility] EMERGENCY DEPARTMENT Provider Note   CSN: [id number] Arrival date & time: 09/06/2017  1451     History   Chief Complaint Chief Complaint  Patient presents with  . Altered Mental Status    HPI Danielle [name] is a 71 y.o. female.  Patient is a 71 year old female who resides in a nursing home with a history of dementia, hypertension, prior GI bleeding, COPD who presents with altered mental status.  History is limited due to her altered mental status.  Per nursing home report, at baseline she is ambulatory and communicative.  They noticed today a market change in her mental status where she is not as responsive.  She has not been able to name.  She has not been eating or drinking.  Other history is unable to be obtained.  Per chart review, she was here 3 days ago for a fall.      Past Medical History:  Diagnosis Date  . Anemia   . Anxiety   . Arthritis    "some; all over" (05/29/2014)  . Asthma   . Chronic airway obstruction (HCC)   . COPD (chronic obstructive pulmonary disease) (HCC)   . Dementia   . Depression   . Encephalopathy   . Esophagitis   . Essential hypertension   . GERD (gastroesophageal reflux disease)   . GIB (gastrointestinal bleeding)   . HLD (hyperlipidemia)   . Hypopotassemia   . Hyposmolality and/or hyponatremia   . Insomnia   . Tobacco abuse     Patient Active Problem List   Diagnosis Date Noted  . Hypokalemia   . Dementia with behavioral disturbance   . Acute respiratory failure with hypoxia (HCC)   . Abdominal pain 07/18/2017  . Colitis 07/18/2017  . COPD (chronic obstructive pulmonary disease) (HCC) 07/18/2017  . Cholelithiasis 05/16/2017  . Hyperchloremic metabolic acidosis 05/12/2017  . AKI (acute kidney injury) (HCC) 05/11/2017  . Leukocytosis 05/11/2017  . Metabolic acidosis 05/11/2017  . Lower extremity weakness 05/11/2017  . ICH (intracerebral hemorrhage) (HCC) 05/26/2014  . Hyponatremia 05/26/2014  .  Acute encephalopathy 05/26/2014  . Sepsis (HCC) 05/26/2014  . CVA (cerebral vascular accident) (HCC) 05/25/2014  . Intracerebral hemorrhage (HCC) 05/25/2014    Past Surgical History:  Procedure Laterality Date  . ABDOMINAL HYSTERECTOMY    . APPENDECTOMY    . CHOLECYSTECTOMY    . TONSILLECTOMY    . TUBAL LIGATION      OB History    No data available       Home Medications    Prior to Admission medications   Medication Sig Start Date End Date Taking? Authorizing Provider  acetaminophen (TYLENOL) 325 MG tablet Take 650 mg by mouth 3 (three) times daily.   Yes [provider]  albuterol (PROVENTIL HFA;VENTOLIN HFA) 108 (90 Base) MCG/ACT inhaler Inhale 1 puff into the lungs daily as needed for wheezing or shortness of breath.    Yes [provider]  atorvastatin (LIPITOR) 10 MG tablet Take 10 mg by mouth daily at 6 PM.   Yes [provider]  budesonide-formoterol (SYMBICORT) 160-4.5 MCG/ACT inhaler Inhale 2 puffs into the lungs 2 (two) times daily.   Yes [provider]  diazepam (VALIUM) 5 MG tablet Take 1 tablet (5 mg total) every 8 (eight) hours as needed by mouth for anxiety. Patient taking differently: Take 5 mg by mouth See admin instructions. Take one tablet (5mg ) by mouth at 0800, 1600, 2000. Then take one tablet (5mg ) every eight  hours as needed for agitation. 07/27/17  Yes Vassie Loll, MD  iron polysaccharides (NIFEREX) 150 MG capsule Take 150 mg by mouth daily.   Yes [provider]  lisinopril (PRINIVIL,ZESTRIL) 10 MG tablet Take 10 mg by mouth daily.   Yes [provider]  metoprolol tartrate (LOPRESSOR) 25 MG tablet Take 25 mg by mouth 2 (two) times daily.   Yes [provider]  nystatin (NYSTATIN) powder Apply 1 g topically 2 (two) times daily as needed (for rash).   Yes [provider]  oxyCODONE (OXY IR/ROXICODONE) 5 MG immediate release tablet Take 1 tablet (5 mg total) by mouth every 6 (six)  hours as needed for breakthrough pain. Patient taking differently: Take 5 mg by mouth See admin instructions. Take one tablet (5mg ) by mouth at 0800, 1600, and 2000. Then take one tablet (5mg ) every six hours as needed for breakthrough pain 05/19/17  Yes Narda Bonds, MD  pantoprazole (PROTONIX) 40 MG tablet Take 1 tablet (40 mg total) 2 (two) times daily by mouth. 07/27/17  Yes Vassie Loll, MD  potassium chloride SA (K-DUR,KLOR-CON) 20 MEQ tablet Take 2 tablets (40 mEq total) daily by mouth. 07/27/17  Yes Vassie Loll, MD  QUEtiapine (SEROQUEL) 25 MG tablet Take 25 mg by mouth 2 (two) times daily.   Yes [provider]  saccharomyces boulardii (FLORASTOR) 250 MG capsule Take 1 capsule (250 mg total) 2 (two) times daily by mouth. 07/27/17  Yes Vassie Loll, MD  venlafaxine (EFFEXOR) 75 MG tablet Take 75 mg by mouth 2 (two) times daily.   Yes [provider]  vitamin B-12 (CYANOCOBALAMIN) 500 MCG tablet Take 500 mcg by mouth daily with breakfast.    Yes [provider]  nicotine (NICODERM CQ - DOSED IN MG/24 HOURS) 21 mg/24hr patch Place 1 patch (21 mg total) daily onto the skin. Patient not taking: Reported on 08/24/2017 07/28/17   Vassie Loll, MD    Family History Family History  Problem Relation Age of Onset  . CVA Mother     Social History Social History   Tobacco Use  . Smoking status: Current Every Day Smoker    Packs/day: 0.25    Years: 50.00    Pack years: 12.50    Types: Cigarettes  . Smokeless tobacco: Never Used  Substance Use Topics  . Alcohol use: No  . Drug use: No     Allergies   Patient has no known allergies.   Review of Systems Review of Systems  Unable to perform ROS: Dementia     Physical Exam Updated Vital Signs BP (!) 97/58   Pulse 85   Temp (!) 97.5 F (36.4 C) (Oral)   Resp 19   SpO2 92%   Physical Exam  Constitutional: She appears well-developed and well-nourished.  HENT:  Head: Normocephalic.    Abrasions to the face.  Oropharynx shows dry mucous membranes with some dried blood on her lips, no active bleeding is noted  Eyes: Pupils are equal, round, and reactive to light.  Neck:  No obvious tenderness on palpation of the spine  Cardiovascular: Normal rate, regular rhythm and normal heart sounds.  Pulmonary/Chest: Effort normal and breath sounds normal. No respiratory distress. She has no wheezes. She has no rales. She exhibits no tenderness.  Abdominal: Soft. Bowel sounds are normal. There is tenderness. There is no rebound and no guarding.  Patient has generalized tenderness on abdominal exam.  She has some ecchymotic areas to her right flank and right  posterior hip.   Variegated mass noted from her rectum to her urethra  Musculoskeletal: She exhibits no edema.  No obvious tenderness on range of motion of the extremities,   Lymphadenopathy:    She has no cervical adenopathy.  Neurological:  Patient will open her eyes to strong verbal stimuli and has some incomprehensible speech, I am unable to assess grip strength, she has some rigidity of her lower extremities  Skin: Skin is warm and dry. No rash noted.  Psychiatric: She has a normal mood and affect.     ED Treatments / Results  Labs (all labs ordered are listed, but only abnormal results are displayed) Labs Reviewed  COMPREHENSIVE METABOLIC PANEL - Abnormal; Notable for the following components:      Result Value   CO2 19 (*)    Creatinine, Ser 2.54 (*)    Calcium 7.6 (*)    Total Protein 4.9 (*)    Albumin 2.1 (*)    Total Bilirubin 0.2 (*)    GFR calc non Af Amer 18 (*)    GFR calc Af Amer 21 (*)    All other components within normal limits  CBC WITH DIFFERENTIAL/PLATELET - Abnormal; Notable for the following components:   WBC 12.1 (*)    RBC 2.91 (*)    Hemoglobin 9.1 (*)    HCT 28.9 (*)    RDW 16.1 (*)    Neutro Abs 10.0 (*)    All other components within normal limits  URINALYSIS, ROUTINE W REFLEX  MICROSCOPIC - Abnormal; Notable for the following components:   APPearance HAZY (*)    Hgb urine dipstick SMALL (*)    Protein, ur 30 (*)    Leukocytes, UA TRACE (*)    Bacteria, UA RARE (*)    Squamous Epithelial / LPF 0-5 (*)    All other components within normal limits  CULTURE, BLOOD (ROUTINE X 2)  CULTURE, BLOOD (ROUTINE X 2)  URINE CULTURE  TROPONIN I  LIPASE, BLOOD  PROTIME-INR  I-STAT ARTERIAL BLOOD GAS, ED  I-STAT CG4 LACTIC ACID, ED  POC OCCULT BLOOD, ED  TYPE AND SCREEN  ABO/RH    EKG  EKG Interpretation  Date/Time:  Sunday August 23 2017 14:57:11 EST Ventricular Rate:  76 PR Interval:    QRS Duration: 182 QT Interval:  407 QTC Calculation: 458 R Axis:   -5 Text Interpretation:  Sinus rhythm Probable left ventricular hypertrophy since last tracing no significant change Confirmed by Rolan Bucco [phone]) on 09/20/2017 3:51:47 PM       Radiology Ct Abdomen Pelvis Wo Contrast  Result Date: 09/12/2017 CLINICAL DATA:  Recent fall. Right abdominal pain and bruising. Initial encounter. EXAM: CT ABDOMEN AND PELVIS WITHOUT CONTRAST TECHNIQUE: Multidetector CT imaging of the abdomen and pelvis was performed following the standard protocol without IV contrast. COMPARISON:  07/21/2017 FINDINGS: Lower chest: No acute findings. Hepatobiliary: No mass visualized on this unenhanced exam. Gallbladder is unremarkable. Pancreas: No mass or inflammatory process visualized on this unenhanced exam. Spleen:  Within normal limits in size. Adrenals/Urinary tract: No evidence of urolithiasis or hydronephrosis. Unremarkable unopacified urinary bladder. Stomach/Bowel: Small bowel dilatation is resolved since previous study. There is persistent mild colonic wall thickening involving the descending and rectosigmoid colon, consistent with colitis. No evidence of abscess or free fluid. Vascular/Lymphatic: No pathologically enlarged lymph nodes identified. No evidence of abdominal aortic aneurysm.  Aortic atherosclerosis. Reproductive: Prior hysterectomy noted. Adnexal regions are unremarkable in appearance. Other:  No evidence of hemoperitoneum. Musculoskeletal: No  acute fractures or other suspicious bone lesions identified. Old compression fracture deformity of the T9 vertebral body is unchanged. Severe degenerative disc disease again seen at L3-4 L4-5. IMPRESSION: No evidence of traumatic injury or other acute findings on this noncontrast exam. Persistent mild colitis involving the descending and rectosigmoid colon. No evidence of abscess or other complication. Electronically Signed   By: Myles RosenthalJohn  Stahl M.D.   On: 09/21/2017 17:53   Dg Shoulder Right  Result Date: 08/22/2017 CLINICAL DATA:  Fall several days ago. Right shoulder pain. Initial encounter. EXAM: RIGHT SHOULDER - 2+ VIEW COMPARISON:  None. FINDINGS: There is no evidence of fracture or dislocation. Generalized osteopenia is noted. Mild glenohumeral osteoarthritis is seen. Humeral head is high-riding, consistent with chronic rotator cuff tear or atrophy. IMPRESSION: No acute findings. Electronically Signed   By: Myles RosenthalJohn  Stahl M.D.   On: 08/29/2017 18:12   Ct Head Wo Contrast  Result Date: 09/09/2017 CLINICAL DATA:  Witnessed fall.  Altered mental status. EXAM: CT HEAD WITHOUT CONTRAST CT CERVICAL SPINE WITHOUT CONTRAST TECHNIQUE: Multidetector CT imaging of the head and cervical spine was performed following the standard protocol without intravenous contrast. Multiplanar CT image reconstructions of the cervical spine were also generated. COMPARISON:  August 21, 2017 FINDINGS: CT HEAD FINDINGS Brain: No subdural, epidural, or subarachnoid hemorrhage. The ventricles and sulci are prominent but stable. Cerebellum, brainstem, and basal cisterns are normal. Lacunar infarcts in the bilateral basal ganglia and right thalamus. Scattered white matter changes. No acute cortical ischemia or infarct. No mass effect or midline shift. Vascular: Calcified  atherosclerosis in the intracranial carotids. Skull: Normal. Negative for fracture or focal lesion. Sinuses/Orbits: Mild mucosal thickening in the left maxillary sinus. Paranasal sinuses otherwise normal. There is opacification of posterior left mastoid air cells, unchanged since August 21, 2014 with no identified fracture or erosion. The mastoid air cells and middle ears are otherwise unremarkable. Other: None. CT CERVICAL SPINE FINDINGS Alignment: Normal. Skull base and vertebrae: No acute fracture. No primary bone lesion or focal pathologic process. Soft tissues and spinal canal: No prevertebral fluid or swelling. No visible canal hematoma. Disc levels:  No significant degenerative changes. Upper chest: Negative. Other: No other abnormalities. IMPRESSION: 1. No acute intracranial abnormality. 2. No fracture or traumatic malalignment in the cervical spine. Electronically Signed   By: Gerome Samavid  Williams III M.D   On: 08/29/2017 18:01   Ct Cervical Spine Wo Contrast  Result Date: 09/13/2017 CLINICAL DATA:  Witnessed fall.  Altered mental status. EXAM: CT HEAD WITHOUT CONTRAST CT CERVICAL SPINE WITHOUT CONTRAST TECHNIQUE: Multidetector CT imaging of the head and cervical spine was performed following the standard protocol without intravenous contrast. Multiplanar CT image reconstructions of the cervical spine were also generated. COMPARISON:  August 21, 2017 FINDINGS: CT HEAD FINDINGS Brain: No subdural, epidural, or subarachnoid hemorrhage. The ventricles and sulci are prominent but stable. Cerebellum, brainstem, and basal cisterns are normal. Lacunar infarcts in the bilateral basal ganglia and right thalamus. Scattered white matter changes. No acute cortical ischemia or infarct. No mass effect or midline shift. Vascular: Calcified atherosclerosis in the intracranial carotids. Skull: Normal. Negative for fracture or focal lesion. Sinuses/Orbits: Mild mucosal thickening in the left maxillary sinus. Paranasal  sinuses otherwise normal. There is opacification of posterior left mastoid air cells, unchanged since August 21, 2014 with no identified fracture or erosion. The mastoid air cells and middle ears are otherwise unremarkable. Other: None. CT CERVICAL SPINE FINDINGS Alignment: Normal. Skull base and vertebrae: No acute fracture. No primary  bone lesion or focal pathologic process. Soft tissues and spinal canal: No prevertebral fluid or swelling. No visible canal hematoma. Disc levels:  No significant degenerative changes. Upper chest: Negative. Other: No other abnormalities. IMPRESSION: 1. No acute intracranial abnormality. 2. No fracture or traumatic malalignment in the cervical spine. Electronically Signed   By: Gerome Samavid  Williams III M.D   On: 08/25/2017 18:01   Dg Chest Port 1 View  Result Date: 09/08/2017 CLINICAL DATA:  Altered mental status.  Blood in mouth.  Flank pain. EXAM: PORTABLE CHEST 1 VIEW COMPARISON:  Chest radiograph July 20, 2017 FINDINGS: Cardiac silhouette is similarly enlarged. Calcified aortic knob. Mild chronic interstitial changes without pleural effusion or focal consolidation. Biapical pleural thickening. No pneumothorax. Osteopenia. Soft tissue planes are nonsuspicious. Old RIGHT posterior rib fractures. IMPRESSION: Stable cardiomegaly and chronic interstitial changes. Electronically Signed   By: Awilda Metroourtnay  Bloomer M.D.   On: 09/09/2017 15:56   Dg Hip Unilat W Or Wo Pelvis 2-3 Views Right  Result Date: 08/31/2017 CLINICAL DATA:  Fall several days ago. Right hip pain and bruising. Initial encounter. EXAM: DG HIP (WITH OR WITHOUT PELVIS) 2-3V RIGHT COMPARISON:  None. FINDINGS: There is no evidence of hip fracture or dislocation. No evidence of pelvic fracture. There is no evidence of arthropathy or other focal bone abnormality. Generalized osteopenia noted. IMPRESSION: No acute findings. Electronically Signed   By: Myles RosenthalJohn  Stahl M.D.   On: 09/20/2017 18:11    Procedures Procedures  (including critical care time)  Medications Ordered in ED Medications  sodium chloride 0.9 % bolus 1,000 mL (not administered)  lactated ringers bolus 1,000 mL (not administered)  lactated ringers bolus 1,000 mL (not administered)  sodium chloride 0.9 % bolus 1,000 mL (0 mLs Intravenous Stopped 09/11/2017 1639)  piperacillin-tazobactam (ZOSYN) IVPB 3.375 g (0 g Intravenous Stopped 08/31/2017 1722)  vancomycin (VANCOCIN) IVPB 1000 mg/200 mL premix (0 mg Intravenous Stopped 09/08/2017 1818)     Initial Impression / Assessment and Plan / ED Course  I have reviewed the triage vital signs and the nursing notes.  Pertinent labs & imaging results that were available during my care of the patient were reviewed by me and considered in my medical decision making (see chart for details).     Patient is a 71 year old female who presents with altered mental status.  At baseline, she has dementia but is normally conversational and ambulatory.  Currently she is minimally responsive but she does have a positive gag reflex and is protecting her airway.  She has no fever.  She has no evidence of pneumonia.  No evidence of urinary tract infection.  CT scan does show evidence of ongoing colitis.  She was recently admitted about a month ago for similar symptoms with colitis.  She does have formed stool present in her diaper.  It does not appear to be diarrhea.  There is no blood in the stool.  She has been persistently hypotensive.  She was treated for probable sepsis with broad-spectrum antibiotics and IV fluids.  She is gotten 2 L of IV fluids currently and is still hypotensive with pressures down into the 70s and 80s systolic.  We will continue IV fluids and likely start pressors if her blood pressure is not improving.  She has evidence of recent falls with abrasions and some ecchymosis to her flank.  However I do not see any evidence of acute traumatic injuries.  Her CT scan does not show evidence of acute traumatic  injuries.  There is no  fractures identified.  She has no evidence of intracranial hemorrhage or obvious stroke.  19:20 I spoke with Dr. Arsenio Loader with [facility]CM who will send someone to evaluate pt.  20:45 CCM has evaluate the patient.  They have discussed the situation with the patient's daughter who does not want aggressive measures such as central line or pressors initiated.  Reportedly, patient has had this kind of episode repetitively.  The patient stops eating and drinking and she gets dehydrated and hypotensive.  The daughter is okay with aggressive IV fluid hydration but does not want to more aggressive treatment per CCM.  CCM recommends admission to the hospitalist service.  I spoke with Dr. Julian Reil with the hospitalist service who will admit the patient.  Final Clinical Impressions(s) / ED Diagnoses   Final diagnoses:  Sepsis, due to unspecified organism (HCC)  Hypotension, unspecified hypotension type  Colitis  AKI (acute kidney injury) [facility])    ED Discharge Orders    None       Rolan Bucco, MD 09/16/2017 2121

## 2017-08-23 NOTE — ED Triage Notes (Signed)
[facility]  LSN 6a or 8:30a.  Pt found "not acting like self", blood in mouth, skin tear on right elbow.  Bruising noted to right cheek and right flank area.  Pt moans.  Very difficult to understand pts speech, she can give name, DOB and place but is hard to determine what the situation is.  Pt did have witnessed fall 08-21-17 and seen in ED.  Pt is DNR. Pt normal is up walking around and talking.  Has h/o dementia.

## 2017-08-24 ENCOUNTER — Inpatient Hospital Stay (HOSPITAL_COMMUNITY): Payer: Medicare Other

## 2017-08-24 DIAGNOSIS — Z515 Encounter for palliative care: Secondary | ICD-10-CM

## 2017-08-24 DIAGNOSIS — R579 Shock, unspecified: Secondary | ICD-10-CM

## 2017-08-24 DIAGNOSIS — Z7189 Other specified counseling: Secondary | ICD-10-CM | POA: Insufficient documentation

## 2017-08-24 LAB — TROPONIN I
Troponin I: <0.03 ng/mL (ref <0.03)
Troponin I: <0.03 ng/mL (ref <0.03)

## 2017-08-24 LAB — CBC
HEMATOCRIT: 30.4 % — AB (ref 36.0–46.0)
Hemoglobin: 9.4 g/dL — ABNORMAL LOW (ref 12.0–15.0)
MCH: 31.1 pg (ref 26.0–34.0)
MCHC: 30.9 g/dL (ref 30.0–36.0)
MCV: 100.7 fL — AB (ref 78.0–100.0)
PLATELETS: 290 10*3/uL (ref 150–400)
RBC: 3.02 MIL/uL — ABNORMAL LOW (ref 3.87–5.11)
RDW: 16.5 % — AB (ref 11.5–15.5)
WBC: 11.9 10*3/uL — ABNORMAL HIGH (ref 4.0–10.5)

## 2017-08-24 LAB — BASIC METABOLIC PANEL
ANION GAP: 9 (ref 5–15)
Anion gap: 9 (ref 5–15)
BUN: 15 mg/dL (ref 6–20)
BUN: 11 mg/dL (ref 6–20)
CALCIUM: 7.3 mg/dL — AB (ref 8.9–10.3)
CALCIUM: 7.7 mg/dL — AB (ref 8.9–10.3)
CO2: 17 mmol/L — AB (ref 22–32)
CO2: 16 mmol/L — AB (ref 22–32)
CREATININE: 1.96 mg/dL — AB (ref 0.44–1.00)
Chloride: 113 mmol/L — ABNORMAL HIGH (ref 101–111)
Chloride: 112 mmol/L — ABNORMAL HIGH (ref 101–111)
Creatinine, Ser: 1.46 mg/dL — ABNORMAL HIGH (ref 0.44–1.00)
GFR calc Af Amer: 29 mL/min — ABNORMAL LOW (ref >60)
GFR, EST AFRICAN AMERICAN: 41 mL/min — AB (ref >60)
GFR, EST NON AFRICAN AMERICAN: 25 mL/min — AB (ref >60)
GFR, EST NON AFRICAN AMERICAN: 36 mL/min — AB (ref >60)
GLUCOSE: 76 mg/dL (ref 65–99)
GLUCOSE: 80 mg/dL (ref 65–99)
POTASSIUM: 3.4 mmol/L — AB (ref 3.5–5.1)
Potassium: 3.3 mmol/L — ABNORMAL LOW (ref 3.5–5.1)
SODIUM: 137 mmol/L (ref 135–145)
Sodium: 139 mmol/L (ref 135–145)

## 2017-08-24 LAB — GLUCOSE, CAPILLARY
GLUCOSE-CAPILLARY: 55 mg/dL — AB (ref 65–99)
GLUCOSE-CAPILLARY: 125 mg/dL — AB (ref 65–99)
GLUCOSE-CAPILLARY: 70 mg/dL (ref 65–99)
GLUCOSE-CAPILLARY: 97 mg/dL (ref 65–99)
Glucose-Capillary: 93 mg/dL (ref 65–99)

## 2017-08-24 LAB — PROCALCITONIN: Procalcitonin: 0.2 ng/mL

## 2017-08-24 LAB — MRSA PCR SCREENING: MRSA by PCR: NEGATIVE

## 2017-08-24 LAB — D-DIMER, QUANTITATIVE: D-Dimer, Quant: 1 ug/mL-FEU — ABNORMAL HIGH (ref 0.00–0.50)

## 2017-08-24 MED ORDER — GLUCOSE 40 % PO GEL
ORAL | Status: AC
  Administered 2017-08-24: 17:00:00
  Filled 2017-08-24: qty 1

## 2017-08-24 MED ORDER — LORAZEPAM 2 MG/ML IJ SOLN
0.5000 mg | Freq: Four times a day (QID) | INTRAMUSCULAR | Status: DC | PRN
  Administered 2017-08-24 – 2017-08-27 (×8): 1 mg via INTRAVENOUS
  Filled 2017-08-24 (×8): qty 1

## 2017-08-24 MED ORDER — POTASSIUM CHLORIDE 10 MEQ/100ML IV SOLN
10.0000 meq | INTRAVENOUS | Status: AC
  Administered 2017-08-24 (×2): 10 meq via INTRAVENOUS
  Filled 2017-08-24 (×2): qty 100

## 2017-08-24 MED ORDER — TECHNETIUM TO 99M ALBUMIN AGGREGATED
4.0000 | Freq: Once | INTRAVENOUS | Status: AC | PRN
  Administered 2017-08-24: 4 via INTRAVENOUS

## 2017-08-24 MED ORDER — DEXTROSE 50 % IV SOLN
INTRAVENOUS | Status: AC
  Administered 2017-08-24: 19:00:00
  Filled 2017-08-24: qty 50

## 2017-08-24 MED ORDER — PLASMA-LYTE 148 IV SOLN
INTRAVENOUS | Status: DC
  Administered 2017-08-25 (×3): via INTRAVENOUS
  Filled 2017-08-24 (×4): qty 500

## 2017-08-24 MED ORDER — FENTANYL CITRATE (PF) 100 MCG/2ML IJ SOLN
12.5000 ug | INTRAMUSCULAR | Status: DC | PRN
  Administered 2017-08-24 (×2): 12.5 ug via INTRAVENOUS
  Administered 2017-08-24 – 2017-08-25 (×2): 50 ug via INTRAVENOUS
  Administered 2017-08-25: 25 ug via INTRAVENOUS
  Administered 2017-08-25 – 2017-08-26 (×3): 50 ug via INTRAVENOUS
  Filled 2017-08-24 (×8): qty 2

## 2017-08-24 MED ORDER — PIPERACILLIN-TAZOBACTAM 3.375 G IVPB
3.3750 g | Freq: Three times a day (TID) | INTRAVENOUS | Status: DC
  Administered 2017-08-24 – 2017-08-26 (×6): 3.375 g via INTRAVENOUS
  Filled 2017-08-24 (×7): qty 50

## 2017-08-24 MED ORDER — LORAZEPAM 2 MG/ML IJ SOLN
0.5000 mg | INTRAMUSCULAR | Status: DC | PRN
  Administered 2017-08-24: 0.5 mg via INTRAVENOUS
  Filled 2017-08-24: qty 1

## 2017-08-24 MED ORDER — TECHNETIUM TC 99M DIETHYLENETRIAME-PENTAACETIC ACID
30.0000 | Freq: Once | INTRAVENOUS | Status: AC | PRN
  Administered 2017-08-24: 30 via RESPIRATORY_TRACT

## 2017-08-24 MED ORDER — VANCOMYCIN HCL IN DEXTROSE 750-5 MG/150ML-% IV SOLN
750.0000 mg | INTRAVENOUS | Status: DC
  Administered 2017-08-24 – 2017-08-25 (×2): 750 mg via INTRAVENOUS
  Filled 2017-08-24 (×3): qty 150

## 2017-08-24 MED ORDER — DIAZEPAM 5 MG PO TABS: 5.0000 mg | ORAL_TABLET | Freq: Three times a day (TID) | ORAL | Status: DC | PRN

## 2017-08-24 NOTE — Progress Notes (Addendum)
Patient re-evaluated.  BP has come up some, 500 UOP over the past hours or so.  Some improvement in mentation.  Patient admits to some neck and low back pain, denies headache, denies chest pain, denies SOB.  1. Ordering PRN fentanyl for pain, also uses chronic opiates, cautious use please 2. Ordering PRN ativan to prevent benzo withdrawal in this patient on chronic benzos, cautious use please 3. Now taking some sips of water, not trying any PO meds yet though 4. Will obtain second troponin 5. Will obtain D.Dimer, VQ scan if positive. 6. Obtain pro-calcitonin 7. AM labs pending. 8. UA is minimally positive for UTI, but not convinced that this is source. 9. Meningitis is considered, but patient doesn't really have headache, per discussion with Son at bedside, it sounds very doubtful that patient would want an invasive procedure like an LP done anyhow. 10. Obtain pal care consult re: goals of care in AM.

## 2017-08-24 NOTE — Progress Notes (Signed)
[facility]  Patient evaluated, chart reviewed and case discussed with family.   Principal Problem:   Hypotension - resolved with aggressive IVF, lactic acid was not elevated - suspicion of sepsis? - no source found as of yet- cont antibiotics for now - cause may be dehydration in setting of benzodiazepines, antipsychotics, narcotics (which the family states she is adamant about always taking- states she has a highly addictive nature when it comes there these medications) and antihypertensives - VQ negative  Active Problems:     Dementia with behavioral disturbance - unable to give Seroquel as she is not swallowing- IV Ativan for now    Acute kidney failure  - appears to be improving and may be related to dehydration    Anemia- recent GI bleed - chronic- stool hemoccult was negative  Unable to take orals - IV meds for now only  COPD - no wheezing- pulse ox on room air is 96% (checked in room) by myself - can use Nebs PRN  Anxiety - per patient's family, she has severe anxiety - PRN IV Ativan for now  H/o severe alcohol abuse in the past - per family, she continued to drink heavily and this is the reason they decided to let her go to a nursing home  Hypokalemia - replaced- f/u in AM  Danielle CantorSaima Louine Tenpenny, MD

## 2017-08-24 NOTE — ED Notes (Signed)
Spoke with main pharmacy in regards to plasmalyte -148, they will look into the situation and send when available.

## 2017-08-24 NOTE — Progress Notes (Signed)
Pharmacy Antibiotic Note  Danielle [name] is a 71 y.o. female admitted on 08/27/2017 with sepsis.  Pharmacy has been consulted for vancomycin and zosyn dosing. Pt is afebrile and WBC is elevated at 11.9. SCr improved to 1.96.   Plan: Change zosyn to 3.375gm IV Q8H (4 hr inf) Change vanc to 750mg  IV Q24H F/u renal fxn, C&S, clinical status and trough at SS    Temp (24hrs), Avg:97.6 F (36.4 C), Min:97.5 F (36.4 C), Max:97.7 F (36.5 C)  Recent Labs  Lab 09/13/2017 1456 09/06/2017 1518 08/24/17 0411  WBC 12.1*  --  11.9*  CREATININE 2.54*  --  1.96*  LATICACIDVEN  --  1.35  --     Estimated Creatinine Clearance: 24.4 mL/min (A) (by C-G formula based on SCr of 1.96 mg/dL (H)).    No Known Allergies  Antimicrobials this admission: vanc 12/2>> Zosyn 12/2>>  Dose adjustments this admission: N/A  Microbiology results: Pending  Thank you for allowing pharmacy to be a part of this patient's care.  Danielle [name], Danielle LeachRachel [name] 08/24/2017 1:51 PM

## 2017-08-24 NOTE — Consult Note (Signed)
Consultation Note Date: 08/24/2017   Patient Name: Danielle [name]  DOB: 04/15/1946  MRN: [id number]  Age / Sex: 71 y.o., female  PCP: Patient, No Pcp Per Referring Physician: Calvert Cantorizwan, Saima, MD  Reason for Consultation: Establishing goals of care  HPI/Patient Profile: 71 y.o. female  with past medical history of COPD, dementia, HTN, severe depression, ETOH abuse, GI bleed, CVA w/ ICH, colitis, and chronic benzo and opiate use admitted on 08/25/2017 with AMS and poor oral intake. Patient initially hypotensive and received multiple fluid boluses. BP now stable. Unsure if BP is d/t sepsis or hypovolemia. Patient also has AKI with creatinine now at 1.96 and GFR 25. CT scan shows mild colitis, improved from last CT. A VQ scan has been ordered.   Patient's daughter Joni Reining(Nicole) and son-in-law Freida Busman(Allen) have been involved in care and decision making. They want to avoid invasive procedures and aggressive care per chart review; however, they want to continue medical management with antibiotics and fluids.   Per family and SNF staff, patient's baseline is ambulatory and conversational.   Patient is from SNF - [facility]. Of note, recent ED visit on 11/30 for fall at SNF.   Clinical Assessment and Goals of Care: Visited patient in ED - she is minimally responsive. With multiple stimulation attempts she tells me her name and denies pain. She will not follow commands. Noted improved vital signs after volume and antibiotic administration. Per nursing, mental status has not improved. Not taking POs.   Called daughter - no answer. Called son in law, he answered and informed me they are in [place] for medical reasons with their daughter. Unable to complete goals of care conversation, but they request palliative care follow up with them tomorrow.   Primary Decision Maker HCPOA - daughter Joni Reiningicole    SUMMARY OF RECOMMENDATIONS   - DNR, no aggressive interventions  including central line and pressors per previous conversation with CCM -PMT will follow along, likely meet with family tomorrow -Monitor response to fluids and antibiotics, if improves will likely return to SNF. If not, may be residential hospice appropriate - will discuss with family.   Code Status/Advance Care Planning:  DNR   Symptom Management:   Pain controlled with current management. No other obvious symptoms or reported by patient.   Palliative Prophylaxis:   Aspiration, Delirium Protocol, Frequent Pain Assessment, Oral Care and Turn Reposition  Additional Recommendations (Limitations, Scope, Preferences):  DNR, no central line, no pressors  Psycho-social/Spiritual:   Desire for further Chaplaincy support:no  Additional Recommendations: Education on Hospice  Prognosis:   Unable to determine  Discharge Planning: To Be Determined      Primary Diagnoses: Present on Admission: . Severe sepsis with septic shock (HCC) . Acute kidney failure (HCC) . (Resolved) Acute metabolic encephalopathy . Dementia with behavioral disturbance . Acute encephalopathy . Anemia   I have reviewed the medical record, interviewed the patient and family, and examined the patient. The following aspects are pertinent.  Past Medical History:  Diagnosis Date  . Anemia   . Anxiety   . Arthritis    "some; all over" (05/29/2014)  . Asthma   . Chronic airway obstruction (HCC)   . COPD (chronic obstructive pulmonary disease) (HCC)   . Dementia   . Depression   . Encephalopathy   . Esophagitis   . Essential hypertension   . GERD (gastroesophageal reflux disease)   . GIB (gastrointestinal bleeding)   . HLD (hyperlipidemia)   . Hypopotassemia   . Hyposmolality and/or  hyponatremia   . Insomnia   . Tobacco abuse    Social History   Socioeconomic History  . Marital status: Divorced    Spouse name: None  . Number of children: None  . Years of education: None  . Highest education  level: None  Social Needs  . Financial resource strain: None  . Food insecurity - worry: None  . Food insecurity - inability: None  . Transportation needs - medical: None  . Transportation needs - non-medical: None  Occupational History  . None  Tobacco Use  . Smoking status: Current Every Day Smoker    Packs/day: 0.25    Years: 50.00    Pack years: 12.50    Types: Cigarettes  . Smokeless tobacco: Never Used  Substance and Sexual Activity  . Alcohol use: No  . Drug use: No  . Sexual activity: No  Other Topics Concern  . None  Social History Narrative  . None   Family History  Problem Relation Age of Onset  . CVA Mother    Scheduled Meds: . fluticasone furoate-vilanterol  1 puff Inhalation Daily  . heparin injection (subcutaneous)  5,000 Units Subcutaneous Q8H   Continuous Infusions: . electrolyte-148 100 mL/hr at 08/24/17 1223  . piperacillin-tazobactam (ZOSYN)  IV Stopped (08/24/17 0855)  . [START ON 08/25/2017] vancomycin     PRN Meds:.acetaminophen **OR** acetaminophen, albuterol, fentaNYL (SUBLIMAZE) injection, LORazepam, ondansetron **OR** ondansetron (ZOFRAN) IV No Known Allergies Review of Systems  Unable to perform ROS   Physical Exam  Constitutional: She appears lethargic. She is cooperative. She has a sickly appearance. No distress.  HENT:  Head: Normocephalic and atraumatic.  Lips coated  Cardiovascular: Regular rhythm. Tachycardia present.  Pulses:      Radial pulses are 1+ on the right side, and 1+ on the left side.       Dorsalis pedis pulses are 1+ on the right side, and 1+ on the left side.  Pulmonary/Chest: Tachypnea noted. She has decreased breath sounds in the right upper field, the right lower field, the left upper field and the left lower field.  Mild accessory muscle use  Abdominal: Soft. Bowel sounds are decreased. There is no tenderness.  Neurological: She appears lethargic. She is disoriented.  Skin: Skin is warm and dry. She is not  diaphoretic.  Psychiatric: Her speech is delayed. She is slowed and withdrawn. Cognition and memory are impaired. She is noncommunicative. She is inattentive.    Vital Signs: BP 112/63   Pulse (!) 111   Temp (!) 97.5 F (36.4 C) (Oral)   Resp (!) 23   SpO2 100%  Pain Assessment: PAINAD       SpO2: SpO2: 100 % O2 Device:SpO2: 100 % O2 Flow Rate: .O2 Flow Rate (L/min): (S) 2 L/min(pt was on 6L, brougth down to 2L [place], will continue to monitor)  IO: Intake/output summary:   Intake/Output Summary (Last 24 hours) at 08/24/2017 1243 Last data filed at 08/24/2017 0914 Gross per 24 hour  Intake 4100 ml  Output 650 ml  Net 3450 ml    LBM:   Baseline Weight:   Most recent weight:       Palliative Assessment/Data: 20%      Time Total: 45 minutes Greater than 50%  of this time was spent counseling and coordinating care related to the above assessment and plan.  Gerlean RenShae Lee Sargent Mankey, DNP, AGNP-C Palliative Medicine Team [phone]

## 2017-08-24 NOTE — ED Notes (Signed)
Pt repositioned in bed, ice chips given, pt continues to moan loudly. PRN Ativan given. Per NM pt has to be able to lie flat for test and needs a free IV, will place order for IV team d/t difficult stick.

## 2017-08-24 NOTE — ED Notes (Signed)
Pt transported to [facility] 

## 2017-08-24 NOTE — ED Notes (Signed)
Palliative care at the bedside

## 2017-08-24 NOTE — ED Notes (Signed)
Pt minimally responsive to verbal stimuli, unable to safely give PO meds at this time.  Family at bedside

## 2017-08-24 NOTE — Progress Notes (Signed)
2 runs of IV K ordered IVF changed to plasmalyte VQ scan ordered, my suspicion for PE isnt high enough at the moment though to start empiric heparin.

## 2017-08-24 NOTE — ED Notes (Signed)
Dr. Julian ReilGardner aware pt is unable to follow commands for PO meds, pt moaning loudly, IV meds ordered

## 2017-08-25 DIAGNOSIS — Z7189 Other specified counseling: Secondary | ICD-10-CM

## 2017-08-25 DIAGNOSIS — G9340 Encephalopathy, unspecified: Secondary | ICD-10-CM

## 2017-08-25 LAB — CBC
HCT: 30.1 % — ABNORMAL LOW (ref 36.0–46.0)
Hemoglobin: 9.3 g/dL — ABNORMAL LOW (ref 12.0–15.0)
MCH: 30.8 pg (ref 26.0–34.0)
MCHC: 30.9 g/dL (ref 30.0–36.0)
MCV: 99.7 fL (ref 78.0–100.0)
PLATELETS: 420 10*3/uL — AB (ref 150–400)
RBC: 3.02 MIL/uL — ABNORMAL LOW (ref 3.87–5.11)
RDW: 16.2 % — AB (ref 11.5–15.5)
WBC: 11.1 10*3/uL — AB (ref 4.0–10.5)

## 2017-08-25 LAB — BASIC METABOLIC PANEL
Anion gap: 12 (ref 5–15)
BUN: 11 mg/dL (ref 6–20)
CALCIUM: 7.8 mg/dL — AB (ref 8.9–10.3)
CO2: 18 mmol/L — ABNORMAL LOW (ref 22–32)
Chloride: 109 mmol/L (ref 101–111)
Creatinine, Ser: 1.02 mg/dL — ABNORMAL HIGH (ref 0.44–1.00)
GFR calc Af Amer: >60 mL/min (ref >60)
GFR, EST NON AFRICAN AMERICAN: 55 mL/min — AB (ref >60)
Glucose, Bld: 103 mg/dL — ABNORMAL HIGH (ref 65–99)
Potassium: 3.4 mmol/L — ABNORMAL LOW (ref 3.5–5.1)
SODIUM: 139 mmol/L (ref 135–145)

## 2017-08-25 LAB — GLUCOSE, CAPILLARY
GLUCOSE-CAPILLARY: 89 mg/dL (ref 65–99)
Glucose-Capillary: 21 mg/dL — CL (ref 65–99)

## 2017-08-25 LAB — PROCALCITONIN: PROCALCITONIN: 0.19 ng/mL

## 2017-08-25 LAB — TROPONIN I: TROPONIN I: 0.03 ng/mL — AB (ref <0.03)

## 2017-08-25 MED ORDER — HALOPERIDOL LACTATE 5 MG/ML IJ SOLN
1.0000 mg | Freq: Four times a day (QID) | INTRAMUSCULAR | Status: DC | PRN
  Administered 2017-08-26 (×2): 1 mg via INTRAVENOUS
  Filled 2017-08-25 (×2): qty 1

## 2017-08-25 MED ORDER — ORAL CARE MOUTH RINSE
15.0000 mL | Freq: Two times a day (BID) | OROMUCOSAL | Status: DC
  Administered 2017-08-25 – 2017-08-27 (×5): 15 mL via OROMUCOSAL

## 2017-08-25 MED ORDER — HALOPERIDOL LACTATE 5 MG/ML IJ SOLN
2.0000 mg | Freq: Once | INTRAMUSCULAR | Status: AC
  Administered 2017-08-25: 2 mg via INTRAVENOUS
  Filled 2017-08-25: qty 1

## 2017-08-25 MED ORDER — ALBUTEROL SULFATE (2.5 MG/3ML) 0.083% IN NEBU
3.0000 mL | INHALATION_SOLUTION | Freq: Four times a day (QID) | RESPIRATORY_TRACT | Status: DC | PRN
  Administered 2017-08-28: 3 mL via RESPIRATORY_TRACT
  Filled 2017-08-25: qty 3

## 2017-08-25 MED ORDER — IPRATROPIUM-ALBUTEROL 0.5-2.5 (3) MG/3ML IN SOLN
3.0000 mL | Freq: Three times a day (TID) | RESPIRATORY_TRACT | Status: DC
  Administered 2017-08-26: 3 mL via RESPIRATORY_TRACT
  Filled 2017-08-25: qty 3

## 2017-08-25 MED ORDER — IPRATROPIUM-ALBUTEROL 0.5-2.5 (3) MG/3ML IN SOLN
3.0000 mL | Freq: Four times a day (QID) | RESPIRATORY_TRACT | Status: DC
  Administered 2017-08-25 (×2): 3 mL via RESPIRATORY_TRACT
  Filled 2017-08-25 (×2): qty 3

## 2017-08-25 MED ORDER — METOPROLOL TARTRATE 5 MG/5ML IV SOLN
5.0000 mg | Freq: Three times a day (TID) | INTRAVENOUS | Status: DC
  Administered 2017-08-25 – 2017-08-27 (×7): 5 mg via INTRAVENOUS
  Filled 2017-08-25 (×7): qty 5

## 2017-08-25 MED ORDER — METHYLPREDNISOLONE SODIUM SUCC 40 MG IJ SOLR
40.0000 mg | Freq: Two times a day (BID) | INTRAMUSCULAR | Status: DC
  Administered 2017-08-25 – 2017-08-26 (×2): 40 mg via INTRAVENOUS
  Filled 2017-08-25 (×2): qty 1

## 2017-08-25 MED ORDER — POTASSIUM CHLORIDE 10 MEQ/100ML IV SOLN
INTRAVENOUS | Status: AC
  Administered 2017-08-25: 10 meq via INTRAVENOUS
  Filled 2017-08-25: qty 100

## 2017-08-25 MED ORDER — SODIUM CHLORIDE 0.9 % IV SOLN
INTRAVENOUS | Status: DC
  Administered 2017-08-25: 19:00:00 via INTRAVENOUS

## 2017-08-25 MED ORDER — CHLORHEXIDINE GLUCONATE 0.12 % MT SOLN
15.0000 mL | Freq: Two times a day (BID) | OROMUCOSAL | Status: DC
  Administered 2017-08-25 – 2017-08-27 (×5): 15 mL via OROMUCOSAL
  Filled 2017-08-25 (×3): qty 15

## 2017-08-25 MED ORDER — POTASSIUM CHLORIDE 10 MEQ/100ML IV SOLN
10.0000 meq | INTRAVENOUS | Status: AC
  Administered 2017-08-25 – 2017-08-26 (×4): 10 meq via INTRAVENOUS
  Filled 2017-08-25 (×2): qty 100

## 2017-08-25 MED ORDER — BUDESONIDE 0.25 MG/2ML IN SUSP
0.2500 mg | Freq: Two times a day (BID) | RESPIRATORY_TRACT | Status: DC
  Administered 2017-08-25 – 2017-08-27 (×4): 0.25 mg via RESPIRATORY_TRACT
  Filled 2017-08-25 (×4): qty 2

## 2017-08-25 NOTE — Progress Notes (Signed)
Daily Progress Note   Patient Name: Danielle [name]       Date: 08/25/2017 DOB: 06-02-1946  Age: 71 y.o. MRN#: [id number] Attending Physician: Debbe Odea, MD Primary Care Physician: Patient, No Pcp Per Admit Date: 08/22/2017  Reason for Consultation/Follow-up: Establishing goals of care  Subjective: Patient appears agitated, trying to climb out of bed. Nursing at bedside repositioning patient. Denies pain.   Length of Stay: 2  Current Medications: Scheduled Meds:  . fluticasone furoate-vilanterol  1 puff Inhalation Daily  . heparin injection (subcutaneous)  5,000 Units Subcutaneous Q8H    Continuous Infusions: . electrolyte-148 100 mL/hr at 08/25/17 0550  . piperacillin-tazobactam (ZOSYN)  IV 3.375 g (08/25/17 0535)  . vancomycin Stopped (08/24/17 1959)    PRN Meds: acetaminophen **OR** acetaminophen, albuterol, fentaNYL (SUBLIMAZE) injection, LORazepam, ondansetron **OR** ondansetron (ZOFRAN) IV  Physical Exam  Constitutional: She appears lethargic. She is uncooperative. She has a sickly appearance. She appears distressed.  HENT:  Head: Normocephalic and atraumatic.  Cardiovascular: Intact distal pulses. Tachycardia present.  Pulses:      Radial pulses are 2+ on the right side, and 2+ on the left side.       Dorsalis pedis pulses are 1+ on the right side, and 1+ on the left side.  Pulmonary/Chest: Accessory muscle usage present. Tachypnea noted. She is in respiratory distress. She has decreased breath sounds in the right upper field, the right lower field, the left upper field and the left lower field.  Abdominal: Soft. Bowel sounds are normal. There is no tenderness.  Genitourinary:  Genitourinary Comments: purewick in place, making urine  Musculoskeletal:       Right lower leg: She  exhibits no edema.       Left lower leg: She exhibits no edema.  Neurological: She appears lethargic. She is disoriented.  Can tell me her name, does not know place, time, situation  Skin: Skin is warm and dry. She is not diaphoretic.  Psychiatric: Her mood appears anxious. Her speech is delayed and slurred. She is agitated. Cognition and memory are impaired. She expresses impulsivity and inappropriate judgment. She is inattentive.            Vital Signs: BP 127/75 (BP Location: Right Arm)   Pulse (!) 134   Temp 97.8 F (36.6 C) (Oral)   Resp (!) 25   SpO2 98%  SpO2: SpO2: 98 % O2 Device: O2 Device: Not Delivered O2 Flow Rate: O2 Flow Rate (L/min): 2 L/min  Intake/output summary:   Intake/Output Summary (Last 24 hours) at 08/25/2017 0913 Last data filed at 08/25/2017 0744 Gross per 24 hour  Intake 1200 ml  Output 625 ml  Net 575 ml   LBM: Last BM Date: (P) 08/27/2017(per RN report) Baseline Weight:   Most recent weight:         Palliative Assessment/Data: 20%    Flowsheet Rows     Most Recent Value  Intake Tab  Referral Department  Hospitalist  Unit at Time of Referral  ER  Palliative Care Primary Diagnosis  Sepsis/Infectious Disease  Date Notified  08/24/17  Palliative Care Type  New Palliative care  Reason for referral  Clarify Goals of Care  Date of Admission  08/22/2017  Date first seen by Palliative Care  08/24/17  # of days Palliative referral response time  0 Day(s)  # of days IP prior to Palliative referral  1  Clinical Assessment  Palliative Performance Scale Score  20%  Psychosocial & Spiritual Assessment  Palliative Care Outcomes  Patient/Family meeting held?  No      Patient Active Problem List   Diagnosis Date Noted  . Shock ([place])   . Palliative care by specialist   . Counseling regarding advanced care planning and goals of care   . Acute kidney failure ([place]) 09/20/2017  . Anemia 09/03/2017  . Hypokalemia   . Dementia with behavioral disturbance    . Acute respiratory failure with hypoxia ([place])   . Abdominal pain 07/18/2017  . Colitis 07/18/2017  . COPD (chronic obstructive pulmonary disease) ([place]) 07/18/2017  . Cholelithiasis 05/16/2017  . Hyperchloremic metabolic acidosis 40/98/1191  . AKI (acute kidney injury) ([place]) 05/11/2017  . Leukocytosis 05/11/2017  . Metabolic acidosis 47/82/9562  . Lower extremity weakness 05/11/2017  . ICH (intracerebral hemorrhage) ([place]) 05/26/2014  . Hyponatremia 05/26/2014  . Acute encephalopathy 05/26/2014  . Severe sepsis with septic shock ([place]) 05/26/2014  . CVA (cerebral vascular accident) ([place]) 05/25/2014  . Intracerebral hemorrhage ([place]) 05/25/2014    Palliative Care Assessment & Plan   HPI: 71 y.o. female  with past medical history of COPD, dementia, HTN, severe depression, ETOH abuse, GI bleed, CVA w/ ICH, colitis, and chronic benzo and opiate use admitted on 09/20/2017 with AMS and poor oral intake. Patient initially hypotensive and received multiple fluid boluses. BP now stable. Unsure if BP is d/t sepsis or hypovolemia. Patient also has AKI with creatinine at 1.96 and GFR 25. This has improved. CT scan shows mild colitis, improved from last CT. VQ scan negative.   Patient's daughter Danielle [name]) and son-in-law Danielle [name]) have been involved in care and decision making. They want to avoid invasive procedures and aggressive care per chart review; however, they want to continue medical management with antibiotics and fluids.   Per family and SNF staff, patient's baseline is ambulatory and conversational.   Patient is from SNF - [facility]. Of note, recent ED visit on 11/30 for fall at SNF.   Assessment: Met with patient and daughter at bedside. Patient unable to participate in Faith conversation. Discussed hospital course and plan of care with daughter. Daughter has good understanding. We discussed improvement in kidney function and BP, but no improvement in mental status.   Daughter  explains difficult relationship with her mother. Patient has a history of severe depression. Daughter tells me she "has always wanted to die". No suicide attempts that she is aware of. Significant history of ETOH and prescription medication abuse.   Daughter tells me patient's current mental status is nowhere close to baseline - daughter has heard patient calling out for family members who have passed away multiple times this admission. At baseline, daughter reports patient usually appears somewhat sedated from prescription medications and is easily confused. However, she is oriented to place, time, and knows her family members.   We discussed the differences between an aggressive medical path and a comfort approach. We also discussed Hospice philosophy. We discussed that we will see how patient does and responds to treatment, if she improves daughter would like her to return to [facility]. However, if she does not daughter is interested in residential Hospice. Need more time to see how patient responds to treatment. Daughter asked about feeding tube if  patient does not increase oral intake. She confirmed that patient would never want a feeding tube placed. Daughter tells me patient had discussion with her many years ago about limiting aggressive medical treatment if she were to ever need it.  The patient also has a son that lives in [place], he is aware of situation.   Recommendations/Plan:  DNR  PMT will follow along - if improves, pt return to [facility]. If does not, family interested in residential hospice.  No aggressive interventions - continue fluids and antibiotics   Goals of Care and Additional Recommendations:  Limitations on Scope of Treatment: DNR, no central line, no pressors, no artificial feeding - continue fluids and antibiotics  Code Status:  DNR  Prognosis:   Unable to determine  Discharge Planning:  To Be Determined  Care plan was discussed with daughter,  bedside nurse  Thank you for allowing the Palliative Medicine Team to assist in the care of this patient.   Total Time 50 minutes Prolonged Time Billed  no       Greater than 50%  of this time was spent counseling and coordinating care related to the above assessment and plan.  Juel Burrow, DNP, AGNP-C Palliative Medicine Team Team Phone # [phone]

## 2017-08-25 NOTE — Progress Notes (Signed)
CRITICAL VALUE ALERT  Critical Value:  Troponin 0.03  Date & Time Notied:  08/25/2017 0048  Provider Notified: Tama GanderKatherine Schorr  Orders Received/Actions taken:

## 2017-08-25 NOTE — Progress Notes (Signed)
Upper airway exp wheeze noted post neb tx. Lung sounds remain clear/diminished bilaterally. RT will continue to monitor.

## 2017-08-25 NOTE — Progress Notes (Addendum)
PROGRESS NOTE    Danielle [name]   ZOX:[id number]  DOB: [birth year]  DOA: 2017-09-11 PCP: Patient, No Pcp Per   Brief Narrative:  Danielle [name]  71 y.o. female from SNF with  dementia, HTN, prior GIB, COPD, ETOH abuse in the past, anxiety, CVA . Apparently she had been refusing to eat and drink for a few days prior to admission. She was sent to the ER for "not acting like herself". She was seen in the ER on 11/30 after a fall. At baseline she is confused but able to ambulate. In the ER she she was lethargic and hypotensive with SBP down to 70-80s and received a significant amount of IVF to bring BP up. She was also started on antibiotics for possible infection.     Subjective: Confused and unable to participate in conversation.   Assessment & Plan:   Principal Problem:   Hypotension - resolved with aggressive IVF-  lactic acid was not elevated - VQ negative - suspicion of sepsis? Has low grade fevers today - CT abd suggestive of mild colitis but there is no diarrhea-  blood culture negative but UA + for 50,000 gr neg rods- cont zosyn and Vanc both for today and consider stopping Vanc tomorrow if cultures neg for MRSA - cause may be dehydration in setting of benzodiazepines, antipsychotics, antihypertensives and narcotics (which the family states she is adamant about always taking- family states she has a highly addictive nature when it comes there these medications)    Active Problems: Tachypnea/ COPD - not hypoxic, CXR and VQ scan unrevealing- cont treatment for possible underlying respiratory infection in relation to her COPD - Duonebs and Pulmicort ordered- add low dose Solumedrol to see if it help decrease her work of breathing - ? If tachypnea related to anxiety- PRN Ativan       Dementia with behavioral disturbance - unable to give Seroquel as she is not swallowing- IV Ativan and Haldol for now    Acute kidney failure  - appears to be improving and may be related to  dehydration    Anemia- recent GI bleed - chronic- stool hemoccult was negative this time - Niferex on hold  Unable to take orals - IV meds and IVF for now only- obtain SLP eval    Anxiety - per patient's family, she has severe anxiety - PRN IV Ativan for now  Tachycardia - ? To anxiety vs holding Metoprolol- will resume IV lopressor as BP has improved  H/o severe alcohol abuse in the past - per family, she continued to drink heavily and this is the reason they decided to let her go to a nursing home  Hypokalemia - replace again today   DVT prophylaxis: heparin Code Status: DNR Family Communication: with daughter and son in law Disposition Plan: possible hospice- her BP has improved but her mental status has not. In addition, she is breathing heavily (but has normal pulse ox) and has tachycardia and no pulmonary etiology has been found Consultants:     Procedures:     Antimicrobials:  Anti-infectives (From admission, onward)   Start     Dose/Rate Route Frequency Ordered Stop   08/25/17 1000  vancomycin (VANCOCIN) IVPB 1000 mg/200 mL premix  Status:  Discontinued     1,000 mg 200 mL/hr over 60 Minutes Intravenous Every 48 hours 09-11-2017 2206 08/24/17 1349   08/24/17 1800  vancomycin (VANCOCIN) IVPB 750 mg/150 ml premix     750 mg 150 mL/hr over 60 Minutes Intravenous  Every 24 hours 08/24/17 1349     08/24/17 1400  piperacillin-tazobactam (ZOSYN) IVPB 3.375 g     3.375 g 12.5 mL/hr over 240 Minutes Intravenous Every 8 hours 08/24/17 1349     09/10/17 2300  piperacillin-tazobactam (ZOSYN) IVPB 2.25 g  Status:  Discontinued     2.25 g 100 mL/hr over 30 Minutes Intravenous Every 8 hours 2017/09/10 2206 08/24/17 1349   09-10-2017 1600  piperacillin-tazobactam (ZOSYN) IVPB 3.375 g     3.375 g 100 mL/hr over 30 Minutes Intravenous  Once September 10, 2017 1555 2017-09-10 1722   Sep 10, 2017 1600  vancomycin (VANCOCIN) IVPB 1000 mg/200 mL premix     1,000 mg 200 mL/hr over 60 Minutes  Intravenous  Once 2017-09-10 1555 09-10-17 1818       Objective: Vitals:   08/25/17 0415 08/25/17 0743 08/25/17 0935 08/25/17 1204  BP: 109/77 127/75  (!) 118/57  Pulse:  (!) 134  (!) 123  Resp:  (!) 25  (!) 30  Temp:  97.8 F (36.6 C)  99.2 F (37.3 C)  TempSrc:  Oral  Axillary  SpO2:  98% 94% 94%    Intake/Output Summary (Last 24 hours) at 08/25/2017 1305 Last data filed at 08/25/2017 1100 Gross per 24 hour  Intake 1110 ml  Output 700 ml  Net 410 ml   There were no vitals filed for this visit.  Examination: General exam: Appears uncomfortable- confused, restless HEENT: PERRLA, oral mucosa moist, no sclera icterus or thrush Respiratory system: Clear to auscultation. RR in high 20s Cardiovascular system: RRR- tachycardia  Gastrointestinal system: Abdomen soft, non-tender, nondistended. Normal bowel sound.   Central nervous system: confused, moves arms and legs spontaneously Extremities: No cyanosis, clubbing or edema Skin: No rashes or ulcers Psychiatry:  Confused     Data Reviewed: I have personally reviewed following labs and imaging studies  CBC: Recent Labs  Lab 2017/09/10 1456 08/24/17 0411 08/25/17 0415  WBC 12.1* 11.9* 11.1*  NEUTROABS 10.0*  --   --   HGB 9.1* 9.4* 9.3*  HCT 28.9* 30.4* 30.1*  MCV 99.3 100.7* 99.7  PLT 336 290 420*   Basic Metabolic Panel: Recent Labs  Lab 09/10/2017 1456 08/24/17 0411 08/24/17 1626 08/25/17 0415  NA 137 139 137 139  K 3.5 3.3* 3.4* 3.4*  CL 109 113* 112* 109  CO2 19* 17* 16* 18*  GLUCOSE 96 76 80 103*  BUN 17 15 11 11   CREATININE 2.54* 1.96* 1.46* 1.02*  CALCIUM 7.6* 7.3* 7.7* 7.8*   GFR: Estimated Creatinine Clearance: 46.8 mL/min (A) (by C-G formula based on SCr of 1.02 mg/dL (H)). Liver Function Tests: Recent Labs  Lab Sep 10, 2017 1456  AST 29  ALT 17  ALKPHOS 122  BILITOT 0.2*  PROT 4.9*  ALBUMIN 2.1*   Recent Labs  Lab 2017/09/10 1456  LIPASE 17   No results for input(s): AMMONIA in the last  168 hours. Coagulation Profile: Recent Labs  Lab 09/10/17 1456  INR 1.15   Cardiac Enzymes: Recent Labs  Lab 09/10/17 1456 08/24/17 0430 08/24/17 1626 08/24/17 2327  TROPONINI <0.03 <0.03 <0.03 0.03*   BNP (last 3 results) No results for input(s): PROBNP in the last 8760 hours. HbA1C: No results for input(s): HGBA1C in the last 72 hours. CBG: Recent Labs  Lab 08/24/17 1834 08/24/17 1855 08/24/17 1939 08/24/17 2348 08/25/17 0304  GLUCAP 70 125* 97 93 89   Lipid Profile: No results for input(s): CHOL, HDL, LDLCALC, TRIG, CHOLHDL, LDLDIRECT in the last 72 hours. Thyroid  Function Tests: No results for input(s): TSH, T4TOTAL, FREET4, T3FREE, THYROIDAB in the last 72 hours. Anemia Panel: No results for input(s): VITAMINB12, FOLATE, FERRITIN, TIBC, IRON, RETICCTPCT in the last 72 hours. Urine analysis:    Component Value Date/Time   COLORURINE YELLOW 08/24/2017 1609   APPEARANCEUR HAZY (A) 09/15/2017 1609   LABSPEC 1.015 09/18/2017 1609   PHURINE 5.0 09/04/2017 1609   GLUCOSEU NEGATIVE 09/14/2017 1609   HGBUR SMALL (A) 09/02/2017 1609   BILIRUBINUR NEGATIVE 09/06/2017 1609   KETONESUR NEGATIVE 09/05/2017 1609   PROTEINUR 30 (A) 09/12/2017 1609   UROBILINOGEN 0.2 05/25/2014 1855   NITRITE NEGATIVE 08/30/2017 1609   LEUKOCYTESUR TRACE (A) 08/29/2017 1609   Sepsis Labs: @LABRCNTIP (procalcitonin:4,lacticidven:4) ) Recent Results (from the past 240 hour(s))  Culture, blood (routine x 2)     Status: None (Preliminary result)   Collection Time: 08/26/2017  4:15 PM  Result Value Ref Range Status   Specimen Description BLOOD RIGHT ANTECUBITAL  Final   Special Requests   Final    IN PEDIATRIC BOTTLE Blood Culture results may not be optimal due to an excessive volume of blood received in culture bottles   Culture NO GROWTH 2 DAYS  Final   Report Status PENDING  Incomplete  Culture, blood (routine x 2)     Status: None (Preliminary result)   Collection Time: 09/15/2017  4:45  PM  Result Value Ref Range Status   Specimen Description BLOOD LEFT ANTECUBITAL  Final   Special Requests   Final    BOTTLES DRAWN AEROBIC AND ANAEROBIC Blood Culture adequate volume   Culture NO GROWTH 2 DAYS  Final   Report Status PENDING  Incomplete  Urine culture     Status: Abnormal (Preliminary result)   Collection Time: 09/03/2017  6:55 PM  Result Value Ref Range Status   Specimen Description URINE, CLEAN CATCH  Final   Special Requests NONE  Final   Culture 50,000 COLONIES/mL GRAM NEGATIVE RODS (A)  Final   Report Status PENDING  Incomplete  MRSA PCR Screening     Status: None   Collection Time: 08/24/17  4:34 PM  Result Value Ref Range Status   MRSA by PCR NEGATIVE NEGATIVE Final    Comment:        The GeneXpert MRSA Assay (FDA approved for NASAL specimens only), is one component of a comprehensive MRSA colonization surveillance program. It is not intended to diagnose MRSA infection nor to guide or monitor treatment for MRSA infections.          Radiology Studies: Ct Abdomen Pelvis Wo Contrast  Result Date: 08/22/2017 CLINICAL DATA:  Recent fall. Right abdominal pain and bruising. Initial encounter. EXAM: CT ABDOMEN AND PELVIS WITHOUT CONTRAST TECHNIQUE: Multidetector CT imaging of the abdomen and pelvis was performed following the standard protocol without IV contrast. COMPARISON:  07/21/2017 FINDINGS: Lower chest: No acute findings. Hepatobiliary: No mass visualized on this unenhanced exam. Gallbladder is unremarkable. Pancreas: No mass or inflammatory process visualized on this unenhanced exam. Spleen:  Within normal limits in size. Adrenals/Urinary tract: No evidence of urolithiasis or hydronephrosis. Unremarkable unopacified urinary bladder. Stomach/Bowel: Small bowel dilatation is resolved since previous study. There is persistent mild colonic wall thickening involving the descending and rectosigmoid colon, consistent with colitis. No evidence of abscess or free  fluid. Vascular/Lymphatic: No pathologically enlarged lymph nodes identified. No evidence of abdominal aortic aneurysm. Aortic atherosclerosis. Reproductive: Prior hysterectomy noted. Adnexal regions are unremarkable in appearance. Other:  No evidence of hemoperitoneum. Musculoskeletal: No acute  fractures or other suspicious bone lesions identified. Old compression fracture deformity of the T9 vertebral body is unchanged. Severe degenerative disc disease again seen at L3-4 L4-5. IMPRESSION: No evidence of traumatic injury or other acute findings on this noncontrast exam. Persistent mild colitis involving the descending and rectosigmoid colon. No evidence of abscess or other complication. Electronically Signed   By: Myles RosenthalJohn  Stahl M.D.   On: 08-24-2017 17:53   Dg Shoulder Right  Result Date: 09/05/2017 CLINICAL DATA:  Fall several days ago. Right shoulder pain. Initial encounter. EXAM: RIGHT SHOULDER - 2+ VIEW COMPARISON:  None. FINDINGS: There is no evidence of fracture or dislocation. Generalized osteopenia is noted. Mild glenohumeral osteoarthritis is seen. Humeral head is high-riding, consistent with chronic rotator cuff tear or atrophy. IMPRESSION: No acute findings. Electronically Signed   By: Myles RosenthalJohn  Stahl M.D.   On: 08-24-2017 18:12   Ct Head Wo Contrast  Result Date: 09/03/2017 CLINICAL DATA:  Witnessed fall.  Altered mental status. EXAM: CT HEAD WITHOUT CONTRAST CT CERVICAL SPINE WITHOUT CONTRAST TECHNIQUE: Multidetector CT imaging of the head and cervical spine was performed following the standard protocol without intravenous contrast. Multiplanar CT image reconstructions of the cervical spine were also generated. COMPARISON:  August 21, 2017 FINDINGS: CT HEAD FINDINGS Brain: No subdural, epidural, or subarachnoid hemorrhage. The ventricles and sulci are prominent but stable. Cerebellum, brainstem, and basal cisterns are normal. Lacunar infarcts in the bilateral basal ganglia and right thalamus.  Scattered white matter changes. No acute cortical ischemia or infarct. No mass effect or midline shift. Vascular: Calcified atherosclerosis in the intracranial carotids. Skull: Normal. Negative for fracture or focal lesion. Sinuses/Orbits: Mild mucosal thickening in the left maxillary sinus. Paranasal sinuses otherwise normal. There is opacification of posterior left mastoid air cells, unchanged since August 21, 2014 with no identified fracture or erosion. The mastoid air cells and middle ears are otherwise unremarkable. Other: None. CT CERVICAL SPINE FINDINGS Alignment: Normal. Skull base and vertebrae: No acute fracture. No primary bone lesion or focal pathologic process. Soft tissues and spinal canal: No prevertebral fluid or swelling. No visible canal hematoma. Disc levels:  No significant degenerative changes. Upper chest: Negative. Other: No other abnormalities. IMPRESSION: 1. No acute intracranial abnormality. 2. No fracture or traumatic malalignment in the cervical spine. Electronically Signed   By: Gerome Samavid  Williams III M.D   On: 08-24-2017 18:01   Ct Cervical Spine Wo Contrast  Result Date: 08/28/2017 CLINICAL DATA:  Witnessed fall.  Altered mental status. EXAM: CT HEAD WITHOUT CONTRAST CT CERVICAL SPINE WITHOUT CONTRAST TECHNIQUE: Multidetector CT imaging of the head and cervical spine was performed following the standard protocol without intravenous contrast. Multiplanar CT image reconstructions of the cervical spine were also generated. COMPARISON:  August 21, 2017 FINDINGS: CT HEAD FINDINGS Brain: No subdural, epidural, or subarachnoid hemorrhage. The ventricles and sulci are prominent but stable. Cerebellum, brainstem, and basal cisterns are normal. Lacunar infarcts in the bilateral basal ganglia and right thalamus. Scattered white matter changes. No acute cortical ischemia or infarct. No mass effect or midline shift. Vascular: Calcified atherosclerosis in the intracranial carotids. Skull:  Normal. Negative for fracture or focal lesion. Sinuses/Orbits: Mild mucosal thickening in the left maxillary sinus. Paranasal sinuses otherwise normal. There is opacification of posterior left mastoid air cells, unchanged since August 21, 2014 with no identified fracture or erosion. The mastoid air cells and middle ears are otherwise unremarkable. Other: None. CT CERVICAL SPINE FINDINGS Alignment: Normal. Skull base and vertebrae: No acute fracture. No primary bone  lesion or focal pathologic process. Soft tissues and spinal canal: No prevertebral fluid or swelling. No visible canal hematoma. Disc levels:  No significant degenerative changes. Upper chest: Negative. Other: No other abnormalities. IMPRESSION: 1. No acute intracranial abnormality. 2. No fracture or traumatic malalignment in the cervical spine. Electronically Signed   By: Gerome Sam III M.D   On: Sep 01, 2017 18:01   Nm Pulmonary Perf And Vent  Result Date: 08/24/2017 CLINICAL DATA:  PE suspected. Positive D-dimer. Intermediate probability. EXAM: NUCLEAR MEDICINE VENTILATION - PERFUSION LUNG SCAN TECHNIQUE: Ventilation images were obtained in multiple projections using inhaled aerosol Tc-25m DTPA. Perfusion images were obtained in multiple projections after intravenous injection of Tc-40m MAA. RADIOPHARMACEUTICALS:  31.3 mCi Technetium-58m DTPA aerosol inhalation and 4.4 mCi Technetium-21m MAA IV COMPARISON:  Chest x-ray from 09/12/2017. FINDINGS: Ventilation: Significant distribution of the radiopharmaceutical within the central airways noted. Heterogeneous ventilation throughout both lungs noted. There is a moderate size ventilation defect identified within the right midlung zone. The left lung appears hypo ventilated when compared with the right. Perfusion: Similar to the ventilation images there is a heterogeneous distribution of the radiopharmaceutical in both lungs. Moderate size perfusion defect within the right midlung is identified  corresponding with the ventilation abnormality. The left lung appears smaller than the right. IMPRESSION: 1. No evidence for acute pulmonary embolus. 2. Diminished exam detail secondary to heterogeneous distribution of the radiopharmaceutical throughout both lungs on both the ventilation and perfusion images. Electronically Signed   By: Signa Kell M.D.   On: 08/24/2017 16:18   Dg Chest Portable 1 View  Result Date: 08/30/2017 CLINICAL DATA:  Initial evaluation for acute change in respiratory status. EXAM: PORTABLE CHEST 1 VIEW COMPARISON:  Prior radiograph from earlier the same day. FINDINGS: Stable cardiomegaly. Mediastinal silhouette normal. Aortic atherosclerosis. Lungs hypoinflated with elevation left hemidiaphragm, stable from previous. Chronic interstitial prominence. No new focal infiltrates. No pulmonary edema or pleural effusion. No pneumothorax. Remotely healed right-sided rib fracture noted. No acute osseus abnormality. IMPRESSION: 1. Stable appearance of the chest with chronic interstitial prominence. No new active cardiopulmonary disease. 2. Cardiomegaly with aortic atherosclerosis. Electronically Signed   By: Rise Mu M.D.   On: 09/21/2017 23:32   Dg Chest Port 1 View  Result Date: September 01, 2017 CLINICAL DATA:  Altered mental status.  Blood in mouth.  Flank pain. EXAM: PORTABLE CHEST 1 VIEW COMPARISON:  Chest radiograph July 20, 2017 FINDINGS: Cardiac silhouette is similarly enlarged. Calcified aortic knob. Mild chronic interstitial changes without pleural effusion or focal consolidation. Biapical pleural thickening. No pneumothorax. Osteopenia. Soft tissue planes are nonsuspicious. Old RIGHT posterior rib fractures. IMPRESSION: Stable cardiomegaly and chronic interstitial changes. Electronically Signed   By: Awilda Metro M.D.   On: September 01, 2017 15:56   Dg Hip Unilat W Or Wo Pelvis 2-3 Views Right  Result Date: 09/12/2017 CLINICAL DATA:  Fall several days ago. Right hip  pain and bruising. Initial encounter. EXAM: DG HIP (WITH OR WITHOUT PELVIS) 2-3V RIGHT COMPARISON:  None. FINDINGS: There is no evidence of hip fracture or dislocation. No evidence of pelvic fracture. There is no evidence of arthropathy or other focal bone abnormality. Generalized osteopenia noted. IMPRESSION: No acute findings. Electronically Signed   By: Myles Rosenthal M.D.   On: 08/31/2017 18:11      Scheduled Meds: . chlorhexidine  15 mL Mouth Rinse BID  . fluticasone furoate-vilanterol  1 puff Inhalation Daily  . heparin injection (subcutaneous)  5,000 Units Subcutaneous Q8H  . mouth rinse  15 mL  Mouth Rinse q12n4p   Continuous Infusions: . electrolyte-148 100 mL/hr at 08/25/17 1300  . piperacillin-tazobactam (ZOSYN)  IV 3.375 g (08/25/17 1256)  . vancomycin Stopped (08/24/17 1959)     LOS: 2 days    Time spent in minutes: 35    Calvert CantorSaima Zeola Brys, MD [facility] Pager: [email or web address] Password [email or web address] 08/25/2017, 1:05 PM

## 2017-08-26 ENCOUNTER — Inpatient Hospital Stay (HOSPITAL_COMMUNITY): Payer: Medicare Other

## 2017-08-26 DIAGNOSIS — R0682 Tachypnea, not elsewhere classified: Secondary | ICD-10-CM

## 2017-08-26 DIAGNOSIS — R6521 Severe sepsis with septic shock: Secondary | ICD-10-CM

## 2017-08-26 DIAGNOSIS — A419 Sepsis, unspecified organism: Principal | ICD-10-CM

## 2017-08-26 LAB — URINE CULTURE: Culture: 50000 — AB

## 2017-08-26 LAB — CBC
HCT: 29.7 % — ABNORMAL LOW (ref 36.0–46.0)
HEMOGLOBIN: 9.3 g/dL — AB (ref 12.0–15.0)
MCH: 30.9 pg (ref 26.0–34.0)
MCHC: 31.3 g/dL (ref 30.0–36.0)
MCV: 98.7 fL (ref 78.0–100.0)
Platelets: 395 10*3/uL (ref 150–400)
RBC: 3.01 MIL/uL — AB (ref 3.87–5.11)
RDW: 16.3 % — ABNORMAL HIGH (ref 11.5–15.5)
WBC: 8.9 10*3/uL (ref 4.0–10.5)

## 2017-08-26 LAB — PROCALCITONIN: PROCALCITONIN: 0.15 ng/mL

## 2017-08-26 LAB — BASIC METABOLIC PANEL
ANION GAP: 11 (ref 5–15)
BUN: 6 mg/dL (ref 6–20)
CALCIUM: 7.9 mg/dL — AB (ref 8.9–10.3)
CO2: 19 mmol/L — ABNORMAL LOW (ref 22–32)
Chloride: 111 mmol/L (ref 101–111)
Creatinine, Ser: 0.79 mg/dL (ref 0.44–1.00)
GFR calc Af Amer: >60 mL/min (ref >60)
Glucose, Bld: 139 mg/dL — ABNORMAL HIGH (ref 65–99)
POTASSIUM: 3.7 mmol/L (ref 3.5–5.1)
SODIUM: 141 mmol/L (ref 135–145)

## 2017-08-26 LAB — TSH: TSH: 0.634 u[IU]/mL (ref 0.350–4.500)

## 2017-08-26 LAB — VITAMIN B12: VITAMIN B 12: 2824 pg/mL — AB (ref 180–914)

## 2017-08-26 LAB — BRAIN NATRIURETIC PEPTIDE: B Natriuretic Peptide: 388.7 pg/mL — ABNORMAL HIGH (ref 0.0–100.0)

## 2017-08-26 LAB — GLUCOSE, CAPILLARY: GLUCOSE-CAPILLARY: 116 mg/dL — AB (ref 65–99)

## 2017-08-26 MED ORDER — DEXTROSE-NACL 5-0.9 % IV SOLN
INTRAVENOUS | Status: DC
  Administered 2017-08-26: 10:00:00 via INTRAVENOUS

## 2017-08-26 MED ORDER — POTASSIUM CHLORIDE 10 MEQ/100ML IV SOLN
INTRAVENOUS | Status: AC
  Administered 2017-08-26: 10 meq via INTRAVENOUS
  Filled 2017-08-26: qty 100

## 2017-08-26 MED ORDER — SODIUM CHLORIDE 0.9 % IV SOLN
1.0000 g | Freq: Three times a day (TID) | INTRAVENOUS | Status: DC
  Administered 2017-08-26 – 2017-08-27 (×4): 1 g via INTRAVENOUS
  Filled 2017-08-26 (×5): qty 1

## 2017-08-26 MED ORDER — IPRATROPIUM-ALBUTEROL 0.5-2.5 (3) MG/3ML IN SOLN
3.0000 mL | Freq: Three times a day (TID) | RESPIRATORY_TRACT | Status: DC
  Administered 2017-08-27: 3 mL via RESPIRATORY_TRACT
  Filled 2017-08-26: qty 3

## 2017-08-26 MED ORDER — IPRATROPIUM-ALBUTEROL 0.5-2.5 (3) MG/3ML IN SOLN
3.0000 mL | Freq: Four times a day (QID) | RESPIRATORY_TRACT | Status: DC
  Administered 2017-08-26 (×2): 3 mL via RESPIRATORY_TRACT
  Filled 2017-08-26 (×2): qty 3

## 2017-08-26 MED ORDER — METHYLPREDNISOLONE SODIUM SUCC 125 MG IJ SOLR
60.0000 mg | Freq: Three times a day (TID) | INTRAMUSCULAR | Status: DC
  Administered 2017-08-26 – 2017-08-27 (×4): 60 mg via INTRAVENOUS
  Filled 2017-08-26 (×4): qty 2

## 2017-08-26 NOTE — Evaluation (Signed)
Clinical/Bedside Swallow Evaluation Patient Details  Name: Danielle [name] MRN: [id number] Date of Birth: 1945-09-25  Today's Date: 08/26/2017 Time: SLP Start Time (ACUTE ONLY): 0830 SLP Stop Time (ACUTE ONLY): 0855 SLP Time Calculation (min) (ACUTE ONLY): 25 min  Past Medical History:  Past Medical History:  Diagnosis Date  . Anemia   . Anxiety   . Arthritis    "some; all over" (05/29/2014)  . Asthma   . Chronic airway obstruction (HCC)   . COPD (chronic obstructive pulmonary disease) (HCC)   . Dementia   . Depression   . Encephalopathy   . Esophagitis   . Essential hypertension   . GERD (gastroesophageal reflux disease)   . GIB (gastrointestinal bleeding)   . HLD (hyperlipidemia)   . Hypopotassemia   . Hyposmolality and/or hyponatremia   . Insomnia   . Tobacco abuse    Past Surgical History:  Past Surgical History:  Procedure Laterality Date  . ABDOMINAL HYSTERECTOMY    . APPENDECTOMY    . CHOLECYSTECTOMY    . TONSILLECTOMY    . TUBAL LIGATION     HPI:  Danielle MarshallVickie Patterson71 y.o.femalefrom SNFwith dementia, HTN, prior GIB, COPD, ETOH abuse in the past, anxiety, CVA . Admitted with confusion, poor po intake, ? sepsis vs dehydration.    Assessment / Plan / Recommendation Clinical Impression  Combination of AMS and tachypnea result in evidence of decreased airway protection with thin liquids, eliminated with trials of pureed solid although cannot r/o silent aspiration based on degree of discoordination and mentation. Discussed with MD, RN, and palliative medicine team. Daughter against even temporary non-oral means of nutrition. In light of this and performance today with pos, recommend NPO except meds crushed in puree. SLP will f/u for readiness to advance pos further which remains good with improved respiratory state and mentation.  SLP Visit Diagnosis: Dysphagia, oropharyngeal phase (R13.12)    Aspiration Risk  Severe aspiration risk    Diet Recommendation NPO  except meds   Medication Administration: Crushed with puree    Other  Recommendations Oral Care Recommendations: Oral care QID   Follow up Recommendations (TBD)      Frequency and Duration min 3x week  2 weeks       Prognosis Prognosis for Safe Diet Advancement: Good Barriers to Reach Goals: Cognitive deficits      Swallow Study   General HPI: Danielle Patterson71 y.o.femalefrom SNFwith dementia, HTN, prior GIB, COPD, ETOH abuse in the past, anxiety, CVA . Admitted with confusion, poor po intake, ? sepsis vs dehydration.  Type of Study: Bedside Swallow Evaluation Previous Swallow Assessment: bedside swallow evaluations complete in both 2015 and 2018 with swallow impacted by respiratory and mental status, appears to have resolved with improved medical condition Diet Prior to this Study: Regular;Thin liquids Temperature Spikes Noted: No Respiratory Status: Nasal cannula History of Recent Intubation: No Behavior/Cognition: Alert;Confused;Distractible;Requires cueing;Doesn't follow directions Oral Cavity Assessment: Dried secretions;Dry(dried bloody secretions on lips and teeth) Oral Care Completed by SLP: Yes Oral Cavity - Dentition: Poor condition;Missing dentition Vision: Functional for self-feeding Self-Feeding Abilities: Able to feed self Patient Positioning: Upright in bed Baseline Vocal Quality: Normal Volitional Cough: Cognitively unable to elicit Volitional Swallow: Unable to elicit    Oral/Motor/Sensory Function Overall Oral Motor/Sensory Function: Generalized oral weakness   Ice Chips Ice chips: Impaired Presentation: Spoon Oral Phase Impairments: Impaired mastication Oral Phase Functional Implications: Prolonged oral transit   Thin Liquid Thin Liquid: Impaired Presentation: Spoon Oral Phase Functional Implications: Prolonged oral transit Pharyngeal  Phase  Impairments: Multiple swallows;Cough - Immediate    Nectar Thick Nectar Thick Liquid: Not tested    Honey Thick Honey Thick Liquid: Not tested   Puree Puree: Impaired Presentation: Spoon Oral Phase Impairments: Impaired mastication Oral Phase Functional Implications: Prolonged oral transit   Solid   Danielle Enloe MA, CCC-SLP [phone]    Solid: Not tested        Danielle [name] Meryl 08/26/2017,10:27 AM

## 2017-08-26 NOTE — Care Management Note (Addendum)
Case Management Note  Patient Details  Name: Danielle [name] MRN: [id number] Date of Birth: [birth year]  Subjective/Objective:   [facility], [facility] - presents with hypotension, tachypnea/copd, dementia with behavioral distrubance, acute kidney failure, anemia, unable to take orals, anxiety, tachycardia, h/o severe etoh abuse in the past, hypokalemia.  Palliative consulted recs,    DNR  No aggressive medical interventions including bipap, temporary feeding tube, and central line.   Per conversation with daughter 12/4: if improves, [facility]. If does not, family interested in residential hospice                  Action/Plan: NCM will follow for dc needs.    Expected Discharge Date:                  Expected Discharge Plan:  Skilled Nursing Facility  In-House Referral:  Clinical Social Work  Discharge planning Services  CM Consult  Post Acute Care Choice:    Choice offered to:     DME Arranged:    DME Agency:     HH Arranged:    HH Agency:     Status of Service:  In process, will continue to follow  If discussed at Long Length of Stay Meetings, dates discussed:    Additional Comments:  Leone Havenaylor, Shyhiem Beeney [place], RN 08/26/2017, 12:37 PM

## 2017-08-26 NOTE — Progress Notes (Signed)
Daily Progress Note   Danielle [name] Name: Danielle [name]       Date: 08/26/2017 DOB: 1946-06-04  Age: 71 y.o. MRN#: [id number] Attending Physician: Elmarie Shiley, MD Primary Care Physician: Danielle [name], No Pcp Per Admit Date: 08/31/2017  Reason for Consultation/Follow-up: Establishing goals of care  Subjective: Pt in bed stating "I just want to be left alone". Very agitated and not compliant with nursing care. Nursing at bedside.  Length of Stay: 3  Current Medications: Scheduled Meds:  . budesonide (PULMICORT) nebulizer solution  0.25 mg Nebulization BID  . chlorhexidine  15 mL Mouth Rinse BID  . ipratropium-albuterol  3 mL Nebulization Q6H  . mouth rinse  15 mL Mouth Rinse q12n4p  . methylPREDNISolone (SOLU-MEDROL) injection  60 mg Intravenous Q8H  . metoprolol tartrate  5 mg Intravenous Q8H    Continuous Infusions: . dextrose 5 % and 0.9% NaCl    . piperacillin-tazobactam (ZOSYN)  IV 3.375 g (08/26/17 0501)    PRN Meds: acetaminophen **OR** acetaminophen, albuterol, fentaNYL (SUBLIMAZE) injection, haloperidol lactate, LORazepam, ondansetron **OR** ondansetron (ZOFRAN) IV  Physical Exam  Constitutional: She appears lethargic. She is uncooperative. She has a sickly appearance. She appears distressed.  HENT:  Head: Normocephalic and atraumatic.  Cardiovascular: Tachycardia present.  Pulses:      Radial pulses are 2+ on the right side, and 2+ on the left side.       Dorsalis pedis pulses are 2+ on the right side, and 2+ on the left side.  Pulmonary/Chest: Accessory muscle usage present. Tachypnea noted. She has decreased breath sounds in the right upper field, the right lower field and the left lower field. She has wheezes in the left upper field.  Abdominal: Soft. Bowel sounds are decreased.  There is no tenderness.  Musculoskeletal:       Right lower leg: She exhibits no edema.       Left lower leg: She exhibits no edema.  Neurological: She appears lethargic. She is disoriented.  Skin: Skin is warm and dry.  Lips coated with dried blood  Psychiatric: Her mood appears anxious. Her speech is slurred. She is agitated. Cognition and memory are impaired. She expresses impulsivity and inappropriate judgment.            Vital Signs: BP (!) 141/95   Pulse (!) 106   Temp 97.8 F (36.6 C) (Oral)   Resp (!) 27   SpO2 94%  SpO2: SpO2: 94 % O2 Device: O2 Device: Nasal Cannula O2 Flow Rate: O2 Flow Rate (L/min): 2 L/min  Intake/output summary:   Intake/Output Summary (Last 24 hours) at 08/26/2017 0914 Last data filed at 08/26/2017 0600 Gross per 24 hour  Intake 2317.5 ml  Output 375 ml  Net 1942.5 ml   LBM: Last BM Date: 08/22/2017 Baseline Weight:   Most recent weight:         Palliative Assessment/Data: 20%    Flowsheet Rows     Most Recent Value  Intake Tab  Referral Department  Hospitalist  Unit at Time of Referral  ER  Palliative Care Primary Diagnosis  Sepsis/Infectious Disease  Date Notified  08/24/17  Palliative Care Type  New Palliative care  Reason for referral  Clarify  Goals of Care  Date of Admission  09/16/2017  Date first seen by Palliative Care  08/24/17  # of days Palliative referral response time  0 Day(s)  # of days IP prior to Palliative referral  1  Clinical Assessment  Palliative Performance Scale Score  20%  Psychosocial & Spiritual Assessment  Palliative Care Outcomes  Danielle [name]/Family meeting held?  Yes  Who was at the meeting?  daughter Danielle [name]  Palliative Care Outcomes  Clarified goals of care, Provided psychosocial or spiritual support, Counseled regarding hospice  Danielle [name]/Family wishes: Interventions discontinued/not started   Tube feedings/TPN, Mechanical Ventilation, Trach, PEG      Danielle [name] Active Problem List   Diagnosis Date Noted    . Goals of care, counseling/discussion   . Shock ([place])   . Palliative care by specialist   . Counseling regarding advanced care planning and goals of care   . Acute kidney failure ([place]) 08/25/2017  . Anemia 09/03/2017  . Hypokalemia   . Dementia with behavioral disturbance   . Acute respiratory failure with hypoxia ([place])   . Abdominal pain 07/18/2017  . Colitis 07/18/2017  . COPD (chronic obstructive pulmonary disease) ([place]) 07/18/2017  . Cholelithiasis 05/16/2017  . Hyperchloremic metabolic acidosis 40/98/1191  . AKI (acute kidney injury) ([place]) 05/11/2017  . Leukocytosis 05/11/2017  . Metabolic acidosis 47/82/9562  . Lower extremity weakness 05/11/2017  . ICH (intracerebral hemorrhage) ([place]) 05/26/2014  . Hyponatremia 05/26/2014  . Acute encephalopathy 05/26/2014  . Severe sepsis with septic shock ([place]) 05/26/2014  . CVA (cerebral vascular accident) ([place]) 05/25/2014  . Intracerebral hemorrhage ([place]) 05/25/2014    Palliative Care Assessment & Plan   HPI: 71 y.o. female  with past medical history of COPD, dementia, HTN, severe depression, ETOH abuse, GI bleed, CVA w/ ICH, colitis, and chronic benzo and opiate use admitted on 09/02/2017 with AMS and poor oral intake. Danielle [name] initially hypotensive and received multiple fluid boluses. BP now stable. Unsure if BP is d/t sepsis or hypovolemia. Danielle [name] also had AKI, resolving now. CT scan shows mild colitis, improved from last CT. VQ scan negative.   Danielle [name]'s daughter Danielle [name]) and son-in-law [name]) have been involved in care and decision making. They want to avoid invasive procedures and aggressive care per chart review; however, they want to continue medical management with antibiotics and fluids.   Per family and SNF staff, Danielle [name]'s baseline is ambulatory and conversational.   Danielle [name] is from SNF - [facility]. Of note, recent ED visit on 11/30 for fall at SNF.   Assessment: Met w/ Danielle [name] at bedside. She appears very  uncomfortable but denies pain. Continues to state "I just want to be left alone". Mental status and respiratory status do not seem to have improved from yesterday, maybe worse. Speech pathologist at bedside conducting swallow evaluation - limited by respiratory status.  Called Danielle [name] to update on her mother's condition. Also asked about bipap and temporary feeding tube - we discussed how these may not be tolerated by her mother d/t agitation. She continued to state "I don't think she would want that". She wants to continue current plan of care with medications, but no aggressive interventions per her mother's wishes that she shared with her prior to being ill.   All questions and concerns addressed. Emotional support provided.   Recommendations/Plan:  DNR  No aggressive medical interventions including bipap, temporary feeding tube, and central line.   Per conversation with daughter 12/4: if improves, pt return to [facility]. If does not, family interested in  residential hospice.  Goals of Care and Additional Recommendations:  Limitations on Scope of Treatment: No Artificial Feeding, no bipap, no central line, DNR  Code Status:  DNR  Prognosis:   Unable to determine  Discharge Planning:  To Be Determined  Care plan was discussed with Dr. Tyrell Antonio and daughter  Thank you for allowing the Palliative Medicine Team to assist in the care of this Danielle [name].   Total Time 30 minutes Prolonged Time Billed  no       Greater than 50%  of this time was spent counseling and coordinating care related to the above assessment and plan.  Juel Burrow, DNP, AGNP-C Palliative Medicine Team Team Phone # [phone]

## 2017-08-26 NOTE — Progress Notes (Signed)
PROGRESS NOTE    Danielle [name]  ZOX:[id number] DOB: 10/19/1945 DOA: 09/10/2017 PCP: Patient, No Pcp Per    Brief Narrative: Danielle [name] 71 y.o.femalefrom SNF with dementia, HTN, prior GIB, COPD, ETOH abuse in the past, anxiety, CVA . Apparently she had been refusing to eat and drink for a few days prior to admission. She was sent to the ER for "not acting like herself". She was seen in the ER on 11/30 after a fall. At baseline she is confused but able to ambulate. In the ER she she was lethargic and hypotensive with SBP down to 70-80s and received a significant amount of IVF to bring BP up. She was also started on antibiotics for possible infection.     Assessment & Plan:   Principal Problem:   Severe sepsis with septic shock (HCC) Active Problems:   Acute encephalopathy   Dementia with behavioral disturbance   Acute kidney failure (HCC)   Anemia   Goals of care, counseling/discussion  1-Hypotension;  Resolved with IV fluids.  Related to hypovolemia vs medications.  V-Q scan negative.  Low grade fever, UA with 50,000 VRE, continue with antibiotics.  MRSA negative, stop IV Vancomycin.  Sepsis is in the differential/   2-Acute encephalopathy; toxic vs metabolic.  Dementia with behavioral disturbance Continue with IV meropenem.  IV ativan and haldol PRN.  Check TSH, B 12.   3-Acute respiratory failure, tachypnea, COPD; exacerbation.  Wheezing on lung exam. Change IV solumedrol to Q 8 hours.  Nebulizer treatment.  Check chest x ray, BNP.  PRN ativan for anxiety.  DNR, no BIPAP.   Dysphagia; fail swallow evaluation.  NPO except med.   Acute renal failure;  Related to hypovolemia,, hypotension.  Improved with fluids.   H/o severe alcohol abuse in the past  Tachy acardia; IV metoprolol/   Anemia- recent GI bleed - chronic- stool hemoccult was negative this time - Niferex on hold  Mild colitis on CT scan; on IV antibiotics.    DVT prophylaxis:  SCD Code Status: DNR Family Communication: no family at bedside.  Disposition Plan: to be determine.    Consultants:   Palliative    Procedures: none   Antimicrobials: meropenem.    Subjective: She is in respiratory distress, denies dyspnea  , she said, leave alone.   Objective: Vitals:   08/25/17 2314 08/26/17 0309 08/26/17 0817 08/26/17 0819  BP: (!) 128/97 114/66  (!) 141/95  Pulse: (!) 106 80  (!) 106  Resp: (!) 21 (!) 23  (!) 27  Temp: 97.6 F (36.4 C) 97.6 F (36.4 C)  97.8 F (36.6 C)  TempSrc: Oral Oral  Oral  SpO2: 96% 97% 94%     Intake/Output Summary (Last 24 hours) at 08/26/2017 0840 Last data filed at 08/26/2017 0600 Gross per 24 hour  Intake 2327.5 ml  Output 375 ml  Net 1952.5 ml   There were no vitals filed for this visit.  Examination:  General exam: tachypnea, mild distress.  Respiratory system: Ussing accessory muscle, bilateral wheezing.  Cardiovascular system: S1 & S2 heard, RRR. Gastrointestinal system: Abdomen is nondistended, soft and nontender. No organomegaly or masses felt. Normal bowel sounds heard. Central nervous system: confuse Extremities: Symmetric 5 x 5 power.     Data Reviewed: I have personally reviewed following labs and imaging studies  CBC: Recent Labs  Lab 08/22/2017 1456 08/24/17 0411 08/25/17 0415 08/26/17 0354  WBC 12.1* 11.9* 11.1* 8.9  NEUTROABS 10.0*  --   --   --  HGB 9.1* 9.4* 9.3* 9.3*  HCT 28.9* 30.4* 30.1* 29.7*  MCV 99.3 100.7* 99.7 98.7  PLT 336 290 420* 395   Basic Metabolic Panel: Recent Labs  Lab 2017-07-22 1456 08/24/17 0411 08/24/17 1626 08/25/17 0415 08/26/17 0354  NA 137 139 137 139 141  K 3.5 3.3* 3.4* 3.4* 3.7  CL 109 113* 112* 109 111  CO2 19* 17* 16* 18* 19*  GLUCOSE 96 76 80 103* 139*  BUN 17 15 11 11 6   CREATININE 2.54* 1.96* 1.46* 1.02* 0.79  CALCIUM 7.6* 7.3* 7.7* 7.8* 7.9*   GFR: Estimated Creatinine Clearance: 59.7 mL/min (by C-G formula based on SCr of 0.79  mg/dL). Liver Function Tests: Recent Labs  Lab 2017-07-22 1456  AST 29  ALT 17  ALKPHOS 122  BILITOT 0.2*  PROT 4.9*  ALBUMIN 2.1*   Recent Labs  Lab 2017-07-22 1456  LIPASE 17   No results for input(s): AMMONIA in the last 168 hours. Coagulation Profile: Recent Labs  Lab 2017-07-22 1456  INR 1.15   Cardiac Enzymes: Recent Labs  Lab 2017-07-22 1456 08/24/17 0430 08/24/17 1626 08/24/17 2327  TROPONINI <0.03 <0.03 <0.03 0.03*   BNP (last 3 results) No results for input(s): PROBNP in the last 8760 hours. HbA1C: No results for input(s): HGBA1C in the last 72 hours. CBG: Recent Labs  Lab 08/24/17 1834 08/24/17 1855 08/24/17 1939 08/24/17 2348 08/25/17 0304  GLUCAP 70 125* 97 93 89   Lipid Profile: No results for input(s): CHOL, HDL, LDLCALC, TRIG, CHOLHDL, LDLDIRECT in the last 72 hours. Thyroid Function Tests: No results for input(s): TSH, T4TOTAL, FREET4, T3FREE, THYROIDAB in the last 72 hours. Anemia Panel: No results for input(s): VITAMINB12, FOLATE, FERRITIN, TIBC, IRON, RETICCTPCT in the last 72 hours. Sepsis Labs: Recent Labs  Lab 2017-07-22 1518 08/24/17 0430 08/25/17 0415 08/26/17 0354  PROCALCITON  --  0.20 0.19 0.15  LATICACIDVEN 1.35  --   --   --     Recent Results (from the past 240 hour(s))  Culture, blood (routine x 2)     Status: None (Preliminary result)   Collection Time: 2017-07-22  4:15 PM  Result Value Ref Range Status   Specimen Description BLOOD RIGHT ANTECUBITAL  Final   Special Requests   Final    IN PEDIATRIC BOTTLE Blood Culture results may not be optimal due to an excessive volume of blood received in culture bottles   Culture NO GROWTH 2 DAYS  Final   Report Status PENDING  Incomplete  Culture, blood (routine x 2)     Status: None (Preliminary result)   Collection Time: 2017-07-22  4:45 PM  Result Value Ref Range Status   Specimen Description BLOOD LEFT ANTECUBITAL  Final   Special Requests   Final    BOTTLES DRAWN AEROBIC AND  ANAEROBIC Blood Culture adequate volume   Culture NO GROWTH 2 DAYS  Final   Report Status PENDING  Incomplete  Urine culture     Status: Abnormal   Collection Time: 2017-07-22  6:55 PM  Result Value Ref Range Status   Specimen Description URINE, CLEAN CATCH  Final   Special Requests NONE  Final   Culture (A)  Final    50,000 COLONIES/mL ESCHERICHIA COLI Confirmed Extended Spectrum Beta-Lactamase Producer (ESBL).  In bloodstream infections from ESBL organisms, carbapenems are preferred over piperacillin/tazobactam. They are shown to have a lower risk of mortality.    Report Status 08/26/2017 FINAL  Final   Organism ID, Bacteria ESCHERICHIA COLI (A)  Final      Susceptibility   Escherichia coli - MIC*    AMPICILLIN >=32 RESISTANT Resistant     CEFAZOLIN >=64 RESISTANT Resistant     CEFTRIAXONE RESISTANT Resistant     CIPROFLOXACIN >=4 RESISTANT Resistant     GENTAMICIN <=1 SENSITIVE Sensitive     IMIPENEM <=0.25 SENSITIVE Sensitive     NITROFURANTOIN <=16 SENSITIVE Sensitive     TRIMETH/SULFA <=20 SENSITIVE Sensitive     AMPICILLIN/SULBACTAM 4 SENSITIVE Sensitive     PIP/TAZO <=4 SENSITIVE Sensitive     Extended ESBL POSITIVE Resistant     * 50,000 COLONIES/mL ESCHERICHIA COLI  MRSA PCR Screening     Status: None   Collection Time: 08/24/17  4:34 PM  Result Value Ref Range Status   MRSA by PCR NEGATIVE NEGATIVE Final    Comment:        The GeneXpert MRSA Assay (FDA approved for NASAL specimens only), is one component of a comprehensive MRSA colonization surveillance program. It is not intended to diagnose MRSA infection nor to guide or monitor treatment for MRSA infections.          Radiology Studies: Nm Pulmonary Perf And Vent  Result Date: 08/24/2017 CLINICAL DATA:  PE suspected. Positive D-dimer. Intermediate probability. EXAM: NUCLEAR MEDICINE VENTILATION - PERFUSION LUNG SCAN TECHNIQUE: Ventilation images were obtained in multiple projections using inhaled aerosol  Tc-1156m DTPA. Perfusion images were obtained in multiple projections after intravenous injection of Tc-3456m MAA. RADIOPHARMACEUTICALS:  31.3 mCi Technetium-356m DTPA aerosol inhalation and 4.4 mCi Technetium-3956m MAA IV COMPARISON:  Chest x-ray from November 12, 2016. FINDINGS: Ventilation: Significant distribution of the radiopharmaceutical within the central airways noted. Heterogeneous ventilation throughout both lungs noted. There is a moderate size ventilation defect identified within the right midlung zone. The left lung appears hypo ventilated when compared with the right. Perfusion: Similar to the ventilation images there is a heterogeneous distribution of the radiopharmaceutical in both lungs. Moderate size perfusion defect within the right midlung is identified corresponding with the ventilation abnormality. The left lung appears smaller than the right. IMPRESSION: 1. No evidence for acute pulmonary embolus. 2. Diminished exam detail secondary to heterogeneous distribution of the radiopharmaceutical throughout both lungs on both the ventilation and perfusion images. Electronically Signed   By: Signa Kellaylor  Stroud M.D.   On: 08/24/2017 16:18        Scheduled Meds: . budesonide (PULMICORT) nebulizer solution  0.25 mg Nebulization BID  . chlorhexidine  15 mL Mouth Rinse BID  . ipratropium-albuterol  3 mL Nebulization TID  . mouth rinse  15 mL Mouth Rinse q12n4p  . methylPREDNISolone (SOLU-MEDROL) injection  40 mg Intravenous Q12H  . metoprolol tartrate  5 mg Intravenous Q8H   Continuous Infusions: . sodium chloride 75 mL/hr at 08/25/17 1858  . electrolyte-148 Stopped (08/25/17 1858)  . piperacillin-tazobactam (ZOSYN)  IV 3.375 g (08/26/17 0501)  . vancomycin Stopped (08/25/17 1900)     LOS: 3 days    Time spent: 35 minutes.     Alba CoryBelkys A Regalado, MD [facility] Pager [phone]  If 7PM-7AM, please contact night-coverage [email or web address] Password [email or web address] 08/26/2017, 8:40 AM

## 2017-08-26 NOTE — Progress Notes (Signed)
Pharmacy Antibiotic Note  Danielle [name] is a 71 y.o. female admitted on 09/07/2017 with sepsis - now found to have an ESBL E.coli UTI and pharmacy consulted to transition Zosyn to Meropenem. AKI on admit - now resolved and SCr <1.  The ESBL E.coli did report at [facility]-Zosyn and the patient had been improving on that regimen so would count those days towards the total antibiotic count - abx D#4.   Plan: 1. D/c Zosyn 2. Start Meropenem 1g IV every 8 hours 3. Will continue to follow renal function, culture results, LOT, and antibiotic de-escalation plans      Temp (24hrs), Avg:97.9 F (36.6 C), Min:97.4 F (36.3 C), Max:99.2 F (37.3 C)  Recent Labs  Lab 09/21/2017 1456 09/12/2017 1518 08/24/17 0411 08/24/17 1626 08/25/17 0415 08/26/17 0354  WBC 12.1*  --  11.9*  --  11.1* 8.9  CREATININE 2.54*  --  1.96* 1.46* 1.02* 0.79  LATICACIDVEN  --  1.35  --   --   --   --     Estimated Creatinine Clearance: 59.7 mL/min (by C-G formula based on SCr of 0.79 mg/dL).    No Known Allergies  Antimicrobials this admission: Vanc 12/2 >> 12/5 Zosyn 12/2 >> 12/5 Meropenem 12/5 >>  Dose adjustments this admission: N/A  Microbiology results: 12/2 BCx >> ngtd 12/2  UCx >> 50k ESBL E.coli UTI 12/3 MRSA PCR >> neg  Thank you for allowing pharmacy to be a part of this patient's care.  Georgina PillionElizabeth Cambrie Sonnenfeld, PharmD, BCPS Clinical Pharmacist Pager: [phone] Clinical phone for 08/26/2017 from 7a-3:30p: [phone] If after 3:30p, please call main pharmacy at: [phone] 08/26/2017 10:20 AM

## 2017-08-27 DIAGNOSIS — Z515 Encounter for palliative care: Secondary | ICD-10-CM

## 2017-08-27 LAB — CBC
HCT: 27.6 % — ABNORMAL LOW (ref 36.0–46.0)
Hemoglobin: 9 g/dL — ABNORMAL LOW (ref 12.0–15.0)
MCH: 31.4 pg (ref 26.0–34.0)
MCHC: 32.6 g/dL (ref 30.0–36.0)
MCV: 96.2 fL (ref 78.0–100.0)
PLATELETS: 452 10*3/uL — AB (ref 150–400)
RBC: 2.87 MIL/uL — ABNORMAL LOW (ref 3.87–5.11)
RDW: 16.3 % — AB (ref 11.5–15.5)
WBC: 15.4 10*3/uL — ABNORMAL HIGH (ref 4.0–10.5)

## 2017-08-27 LAB — BASIC METABOLIC PANEL
Anion gap: 11 (ref 5–15)
BUN: 9 mg/dL (ref 6–20)
CALCIUM: 8.4 mg/dL — AB (ref 8.9–10.3)
CO2: 19 mmol/L — AB (ref 22–32)
CREATININE: 0.74 mg/dL (ref 0.44–1.00)
Chloride: 117 mmol/L — ABNORMAL HIGH (ref 101–111)
GFR calc Af Amer: >60 mL/min (ref >60)
Glucose, Bld: 167 mg/dL — ABNORMAL HIGH (ref 65–99)
Potassium: 3.4 mmol/L — ABNORMAL LOW (ref 3.5–5.1)
Sodium: 147 mmol/L — ABNORMAL HIGH (ref 135–145)

## 2017-08-27 LAB — RPR: RPR: NONREACTIVE

## 2017-08-27 MED ORDER — MORPHINE SULFATE 25 MG/ML IV SOLN
4.0000 mg/h | INTRAVENOUS | Status: DC
  Administered 2017-08-27: 2 mg/h via INTRAVENOUS
  Administered 2017-08-30: 3 mg/h via INTRAVENOUS
  Filled 2017-08-27 (×2): qty 10

## 2017-08-27 MED ORDER — GLYCOPYRROLATE 0.2 MG/ML IJ SOLN
0.2000 mg | INTRAMUSCULAR | Status: DC | PRN
  Administered 2017-08-29 – 2017-09-01 (×5): 0.2 mg via INTRAVENOUS
  Filled 2017-08-27 (×5): qty 1

## 2017-08-27 MED ORDER — MORPHINE SULFATE (PF) 4 MG/ML IV SOLN
1.0000 mg | INTRAVENOUS | Status: AC
  Administered 2017-08-27: 1 mg via INTRAVENOUS
  Filled 2017-08-27: qty 1

## 2017-08-27 MED ORDER — LORAZEPAM 2 MG/ML IJ SOLN
1.0000 mg | INTRAMUSCULAR | Status: DC | PRN
  Administered 2017-08-28: 1 mg via INTRAVENOUS
  Filled 2017-08-27 (×2): qty 1

## 2017-08-27 MED ORDER — MORPHINE BOLUS VIA INFUSION
2.0000 mg | INTRAVENOUS | Status: DC | PRN
  Administered 2017-08-28 (×2): 2 mg via INTRAVENOUS
  Filled 2017-08-27: qty 2

## 2017-08-27 MED ORDER — BIOTENE DRY MOUTH MT LIQD: 15.0000 mL | OROMUCOSAL | Status: DC | PRN

## 2017-08-27 MED ORDER — DEXTROSE 5 % IV SOLN
INTRAVENOUS | Status: DC
  Administered 2017-08-27: 07:00:00 via INTRAVENOUS

## 2017-08-27 MED ORDER — MORPHINE SULFATE (PF) 4 MG/ML IV SOLN: 2.0000 mg | INTRAVENOUS | Status: DC | PRN

## 2017-08-27 MED ORDER — POTASSIUM CHLORIDE 10 MEQ/100ML IV SOLN
INTRAVENOUS | Status: AC
  Administered 2017-08-27: 10 meq via INTRAVENOUS
  Filled 2017-08-27: qty 100

## 2017-08-27 MED ORDER — IPRATROPIUM-ALBUTEROL 0.5-2.5 (3) MG/3ML IN SOLN
3.0000 mL | Freq: Four times a day (QID) | RESPIRATORY_TRACT | Status: DC
  Administered 2017-08-27: 3 mL via RESPIRATORY_TRACT
  Filled 2017-08-27: qty 3

## 2017-08-27 MED ORDER — MORPHINE SULFATE (PF) 4 MG/ML IV SOLN
0.5000 mg | INTRAVENOUS | Status: DC | PRN
  Administered 2017-08-27: 1 mg via INTRAVENOUS
  Filled 2017-08-27: qty 1

## 2017-08-27 MED ORDER — MORPHINE SULFATE (PF) 4 MG/ML IV SOLN
2.0000 mg | INTRAVENOUS | Status: DC | PRN
  Administered 2017-08-27: 2 mg via INTRAVENOUS
  Filled 2017-08-27: qty 1

## 2017-08-27 MED ORDER — MORPHINE SULFATE (PF) 4 MG/ML IV SOLN: 1.0000 mg | INTRAVENOUS | Status: DC | PRN

## 2017-08-27 MED ORDER — GLYCOPYRROLATE 0.2 MG/ML IJ SOLN: 0.2000 mg | INTRAMUSCULAR | Status: DC | PRN

## 2017-08-27 MED ORDER — LORAZEPAM 2 MG/ML PO CONC: 1.0000 mg | ORAL | Status: DC | PRN

## 2017-08-27 MED ORDER — HALOPERIDOL LACTATE 5 MG/ML IJ SOLN
0.5000 mg | INTRAMUSCULAR | Status: DC | PRN
  Administered 2017-08-29: 0.5 mg via INTRAVENOUS
  Filled 2017-08-27: qty 1

## 2017-08-27 MED ORDER — HALOPERIDOL LACTATE 2 MG/ML PO CONC
0.5000 mg | ORAL | Status: DC | PRN
  Administered 2017-08-28 (×2): 0.5 mg via SUBLINGUAL
  Filled 2017-08-27 (×4): qty 0.3

## 2017-08-27 MED ORDER — POTASSIUM CHLORIDE 10 MEQ/100ML IV SOLN
10.0000 meq | INTRAVENOUS | Status: AC
  Administered 2017-08-27 (×3): 10 meq via INTRAVENOUS
  Filled 2017-08-27 (×2): qty 100

## 2017-08-27 NOTE — Progress Notes (Addendum)
PROGRESS NOTE    Finis BudVickie Ihrig  WUJ:[id number] DOB: 1945-10-11 DOA: 08/26/2017 PCP: Patient, No Pcp Per    Brief Narrative: Finis BudVickie Kassel 71 y.o.[facility]emalefrom SNF with dementia, HTN, prior GIB, COPD, ETOH abuse in the past, anxiety, CVA . Apparently she had been refusing to eat and drink for a few days prior to admission. She was sent to the ER for "not acting like herself". She was seen in the ER on 11/30 after a fall. At baseline she is confused but able to ambulate. In the ER she she was lethargic and hypotensive with SBP down to 70-80s and received a significant amount of IVF to bring BP up. She was also started on antibiotics for possible infection.     Assessment & Plan:   Principal Problem:   Severe sepsis with septic shock (HCC) Active Problems:   Acute encephalopathy   Dementia with behavioral disturbance   Acute kidney failure (HCC)   Anemia   Goals of care, counseling/discussion   Tachypnea  1-Hypotension; Resolved with IV fluids.  Related to hypovolemia vs medications.  V-Q scan negative.  Low grade fever, UA with 50,000 VRE, continue with antibiotics.  MRSA negative, stop IV Vancomycin.   2-Sepsis; had hypotension, treating for UTI. Urine grew ESBL 50,000 Treated with IV fluids and IV antibiotics.  BP stabilized.   Acute encephalopathy; toxic vs metabolic.  Dementia with behavioral disturbance Continue with IV meropenem.  IV ativan and haldol PRN.  TSH, B 12 normal,  RPR negative.   3-Acute respiratory failure, tachypnea, COPD; exacerbation.   IV solumedrol to Q 8 hours.  Nebulizer treatment.  Chest xray negative for pulmonary edema. Has hypernatremia. Avoid lasix.  PRN ativan for anxiety.  DNR, no BIPAP.  Patient with increase work of breathing this morning,. She appears in distress. Discussed with daughter, will start morphine PRN for increase work of breathing. Daughter agrees with medications to keep her mom comfortable. She will discussed  further with palliative care steps moving forward.  Meropenem will cover for aspiration PNA>   Hypernatremia; change IV fluids to D 5.   Dysphagia; fail swallow evaluation.  NPO except med.   Acute renal failure;  Related to hypovolemia,, hypotension.  Improved with fluids.   H/o severe alcohol abuse in the past  Tachy acardia; IV metoprolol/   Anemia- recent GI bleed - chronic- stool hemoccult was negative this time - Niferex on hold  Mild colitis on CT scan; on IV antibiotics.    DVT prophylaxis: SCD Code Status: DNR Family Communication: no family at bedside.  Disposition Plan: to be determine.    Consultants:   Palliative    Procedures: none   Antimicrobials: meropenem.    Subjective: Tachypnea, is respiratory distress. Try to said few words.   Objective: Vitals:   08/27/17 0300 08/27/17 0334 08/27/17 0756 08/27/17 0819  BP:  121/72    Pulse:  (!) 128    Resp:  (!) 27    Temp: 97.6 F (36.4 C) 97.6 F (36.4 C)  99 F (37.2 C)  TempSrc: Oral Oral  Axillary  SpO2:  99% 97%     Intake/Output Summary (Last 24 hours) at 08/27/2017 1021 Last data filed at 08/27/2017 0600 Gross per 24 hour  Intake 1440 ml  Output -  Net 1440 ml   There were no vitals filed for this visit.  Examination:  General exam: Tachypnea, respiratory distress.  Respiratory system: Ussing accessory muscle to breath, decreases breath sounds.  Cardiovascular system: S 1, S  2 RRR, tachycardia Gastrointestinal system: bs present, soft, nt Central nervous system: lethargic Extremities: no edema.      Data Reviewed: I have personally reviewed following labs and imaging studies  CBC: Recent Labs  Lab 09/18/2017 1456 08/24/17 0411 08/25/17 0415 08/26/17 0354 08/27/17 0522  WBC 12.1* 11.9* 11.1* 8.9 15.4*  NEUTROABS 10.0*  --   --   --   --   HGB 9.1* 9.4* 9.3* 9.3* 9.0*  HCT 28.9* 30.4* 30.1* 29.7* 27.6*  MCV 99.3 100.7* 99.7 98.7 96.2  PLT 336 290 420* 395 452*    Basic Metabolic Panel: Recent Labs  Lab 08/24/17 0411 08/24/17 1626 08/25/17 0415 08/26/17 0354 08/27/17 0522  NA 139 137 139 141 147*  K 3.3* 3.4* 3.4* 3.7 3.4*  CL 113* 112* 109 111 117*  CO2 17* 16* 18* 19* 19*  GLUCOSE 76 80 103* 139* 167*  BUN 15 11 11 6 9   CREATININE 1.96* 1.46* 1.02* 0.79 0.74  CALCIUM 7.3* 7.7* 7.8* 7.9* 8.4*   GFR: Estimated Creatinine Clearance: 59.7 mL/min (by C-G formula based on SCr of 0.74 mg/dL). Liver Function Tests: Recent Labs  Lab 09/19/2017 1456  AST 29  ALT 17  ALKPHOS 122  BILITOT 0.2*  PROT 4.9*  ALBUMIN 2.1*   Recent Labs  Lab 09/06/2017 1456  LIPASE 17   No results for input(s): AMMONIA in the last 168 hours. Coagulation Profile: Recent Labs  Lab 09/21/2017 1456  INR 1.15   Cardiac Enzymes: Recent Labs  Lab 09/14/2017 1456 08/24/17 0430 08/24/17 1626 08/24/17 2327  TROPONINI <0.03 <0.03 <0.03 0.03*   BNP (last 3 results) No results for input(s): PROBNP in the last 8760 hours. HbA1C: No results for input(s): HGBA1C in the last 72 hours. CBG: Recent Labs  Lab 08/24/17 1855 08/24/17 1939 08/24/17 2348 08/25/17 0304 08/26/17 0824  GLUCAP 125* 97 93 89 116*   Lipid Profile: No results for input(s): CHOL, HDL, LDLCALC, TRIG, CHOLHDL, LDLDIRECT in the last 72 hours. Thyroid Function Tests: Recent Labs    08/26/17 0959  TSH 0.634   Anemia Panel: Recent Labs    08/26/17 0959  VITAMINB12 2,824*   Sepsis Labs: Recent Labs  Lab 08/22/2017 1518 08/24/17 0430 08/25/17 0415 08/26/17 0354  PROCALCITON  --  0.20 0.19 0.15  LATICACIDVEN 1.35  --   --   --     Recent Results (from the past 240 hour(s))  Culture, blood (routine x 2)     Status: None (Preliminary result)   Collection Time: 09/21/2017  4:15 PM  Result Value Ref Range Status   Specimen Description BLOOD RIGHT ANTECUBITAL  Final   Special Requests   Final    IN PEDIATRIC BOTTLE Blood Culture results may not be optimal due to an excessive volume  of blood received in culture bottles   Culture NO GROWTH 3 DAYS  Final   Report Status PENDING  Incomplete  Culture, blood (routine x 2)     Status: None (Preliminary result)   Collection Time: 08/30/2017  4:45 PM  Result Value Ref Range Status   Specimen Description BLOOD LEFT ANTECUBITAL  Final   Special Requests   Final    BOTTLES DRAWN AEROBIC AND ANAEROBIC Blood Culture adequate volume   Culture NO GROWTH 3 DAYS  Final   Report Status PENDING  Incomplete  Urine culture     Status: Abnormal   Collection Time: 08/29/2017  6:55 PM  Result Value Ref Range Status   Specimen Description URINE,  CLEAN CATCH  Final   Special Requests NONE  Final   Culture (A)  Final    50,000 COLONIES/mL ESCHERICHIA COLI Confirmed Extended Spectrum Beta-Lactamase Producer (ESBL).  In bloodstream infections from ESBL organisms, carbapenems are preferred over piperacillin/tazobactam. They are shown to have a lower risk of mortality.    Report Status 08/26/2017 FINAL  Final   Organism ID, Bacteria ESCHERICHIA COLI (A)  Final      Susceptibility   Escherichia coli - MIC*    AMPICILLIN >=32 RESISTANT Resistant     CEFAZOLIN >=64 RESISTANT Resistant     CEFTRIAXONE RESISTANT Resistant     CIPROFLOXACIN >=4 RESISTANT Resistant     GENTAMICIN <=1 SENSITIVE Sensitive     IMIPENEM <=0.25 SENSITIVE Sensitive     NITROFURANTOIN <=16 SENSITIVE Sensitive     TRIMETH/SULFA <=20 SENSITIVE Sensitive     AMPICILLIN/SULBACTAM 4 SENSITIVE Sensitive     PIP/TAZO <=4 SENSITIVE Sensitive     Extended ESBL POSITIVE Resistant     * 50,000 COLONIES/mL ESCHERICHIA COLI  MRSA PCR Screening     Status: None   Collection Time: 08/24/17  4:34 PM  Result Value Ref Range Status   MRSA by PCR NEGATIVE NEGATIVE Final    Comment:        The GeneXpert MRSA Assay (FDA approved for NASAL specimens only), is one component of a comprehensive MRSA colonization surveillance program. It is not intended to diagnose MRSA infection nor to  guide or monitor treatment for MRSA infections.          Radiology Studies: Dg Chest Port 1 View  Result Date: 08/26/2017 CLINICAL DATA:  Tachypnea EXAM: PORTABLE CHEST 1 VIEW COMPARISON:  Three days ago FINDINGS: Chronic borderline cardiomegaly. Stable mediastinal contours. Chronic interstitial coarsening. There is no edema, consolidation, effusion, or pneumothorax. No acute osseous finding. IMPRESSION: No acute finding.  Baseline appearance of the chest. Electronically Signed   By: Marnee Spring M.D.   On: 08/26/2017 10:01        Scheduled Meds: . budesonide (PULMICORT) nebulizer solution  0.25 mg Nebulization BID  . chlorhexidine  15 mL Mouth Rinse BID  . ipratropium-albuterol  3 mL Nebulization Q6H  . mouth rinse  15 mL Mouth Rinse q12n4p  . methylPREDNISolone (SOLU-MEDROL) injection  60 mg Intravenous Q8H  . metoprolol tartrate  5 mg Intravenous Q8H   Continuous Infusions: . dextrose 75 mL/hr at 08/27/17 0650  . meropenem (MERREM) IV Stopped (08/27/17 0537)     LOS: 4 days    Time spent: 35 minutes.     Alba Cory, MD [facility] Pager [phone]  If 7PM-7AM, please contact night-coverage [email or web address] Password [email or web address] 08/27/2017, 10:21 AM

## 2017-08-27 NOTE — Progress Notes (Signed)
  Speech Language Pathology Treatment: Dysphagia  Patient Details Name: Danielle [name] MRN: [id number] DOB: May 15, 1946 Today's Date: 08/27/2017 Time: [id number] SLP Time Calculation (min) (ACUTE ONLY): 12 min  Assessment / Plan / Recommendation Clinical Impression  Patient tachpneic at baseline with RR in the low 40s. Mentation worsened as compared to yesterday's evaluation with increased lethargy, decreased responsiveness to auditory stimuli. Oral care complete as tongue and lips dry, coated with dried blood. Patient with limited response to oral care, no pharyngeal swallow initiated despite max tactile and verbal cueing. No po trials provided today given increased risk for aspiration. Understand that family does not wish for even temporary non-oral means of nutrition however patient will likely not be able to take oral meds functionally or safely at this time. Would consider providing po meds only if patient becomes more alert.    HPI HPI: Danielle MarshallVickie Patterson71 y.o.femalefrom SNFwith dementia, HTN, prior GIB, COPD, ETOH abuse in the past, anxiety, CVA . Admitted with confusion, poor po intake, ? sepsis vs dehydration.       SLP Plan  Continue with current plan of care       Recommendations  Diet recommendations: NPO(including meds unless patient becomes more alert) Medication Administration: Crushed with puree                Oral Care Recommendations: Oral care QID SLP Visit Diagnosis: Dysphagia, oropharyngeal phase (R13.12) Plan: Continue with current plan of care       [facility] Eleuterio Dollar MA, CCC-SLP [phone]                 [place]a Kellison Meryl 08/27/2017, 9:47 AM

## 2017-08-27 NOTE — Progress Notes (Signed)
Daily Progress Note   Patient Name: Danielle [name]       Date: 08/27/2017 DOB: June 10, 1946  Age: 71 y.o. MRN#: [id number] Attending Physician: Alba Coryegalado, Belkys A, MD Primary Care Physician: Patient, No Pcp Per Admit Date: 09/21/2017  Reason for Consultation/Follow-up: Establishing goals of care and Psychosocial/spiritual support  Subjective: Patient lying in bed, RR 30s-40s, can tell me her name but disoriented to time, location, and situation. Denies pain. Will not open her eyes to command.   Length of Stay: 4  Current Medications: Scheduled Meds:  . budesonide (PULMICORT) nebulizer solution  0.25 mg Nebulization BID  . chlorhexidine  15 mL Mouth Rinse BID  . ipratropium-albuterol  3 mL Nebulization TID  . mouth rinse  15 mL Mouth Rinse q12n4p  . methylPREDNISolone (SOLU-MEDROL) injection  60 mg Intravenous Q8H  . metoprolol tartrate  5 mg Intravenous Q8H    Continuous Infusions: . dextrose 75 mL/hr at 08/27/17 0650  . meropenem (MERREM) IV Stopped (08/27/17 0537)  . potassium chloride 10 mEq (08/27/17 0654)    PRN Meds: acetaminophen **OR** acetaminophen, albuterol, fentaNYL (SUBLIMAZE) injection, haloperidol lactate, LORazepam, ondansetron **OR** ondansetron (ZOFRAN) IV  Physical Exam  Constitutional: She appears lethargic. She has a sickly appearance. She appears distressed.  HENT:  Head: Normocephalic and atraumatic.  Mouth coated with dried blood  Cardiovascular: Tachycardia present.  Pulses:      Radial pulses are 2+ on the right side, and 2+ on the left side.       Dorsalis pedis pulses are 2+ on the right side, and 2+ on the left side.  Pulmonary/Chest: Accessory muscle usage present. Tachypnea noted. She has decreased breath sounds in the right upper field, the right lower  field, the left upper field and the left lower field.  Abdominal: Soft. Bowel sounds are decreased. There is no tenderness.  Musculoskeletal:       Right lower leg: She exhibits no edema.       Left lower leg: She exhibits no edema.  Neurological: She appears lethargic. She is disoriented.  Oriented to self only, does not follow commands.  Skin: Skin is warm and dry. She is not diaphoretic.  Psychiatric: She is withdrawn. Cognition and memory are impaired. She expresses impulsivity and inappropriate judgment.            Vital Signs: BP 121/72   Pulse (!) 128   Temp 97.6 F (36.4 C) (Oral)   Resp (!) 27   SpO2 97%  SpO2: SpO2: 97 % O2 Device: O2 Device: Not Delivered O2 Flow Rate: O2 Flow Rate (L/min): 2 L/min  Intake/output summary:   Intake/Output Summary (Last 24 hours) at 08/27/2017 0814 Last data filed at 08/27/2017 0600 Gross per 24 hour  Intake 1440 ml  Output -  Net 1440 ml   LBM: Last BM Date: 08/27/17 Baseline Weight:   Most recent weight:         Palliative Assessment/Data:10 %    Flowsheet Rows     Most Recent Value  Intake Tab  Referral Department  Hospitalist  Unit at Time of Referral  ER  Palliative Care Primary Diagnosis  Sepsis/Infectious Disease  Date Notified  08/24/17  Palliative Care Type  New Palliative care  Reason for referral  Clarify Goals of Care  Date of Admission  09/06/2017  Date first seen by Palliative Care  08/24/17  # of days Palliative referral response time  0 Day(s)  # of days IP prior to Palliative referral  1  Clinical Assessment  Palliative Performance Scale Score  20%  Psychosocial & Spiritual Assessment  Palliative Care Outcomes  Patient/Family meeting held?  Yes  Who was at the meeting?  daughter Joni Reiningicole  Palliative Care Outcomes  Clarified goals of care, Provided psychosocial or spiritual support, Counseled regarding hospice  Patient/Family wishes: Interventions discontinued/not started   BiPAP      Patient Active  Problem List   Diagnosis Date Noted  . Tachypnea   . Goals of care, counseling/discussion   . Shock (HCC)   . Palliative care by specialist   . Counseling regarding advanced care planning and goals of care   . Acute kidney failure (HCC) 09/14/2017  . Anemia 09/06/2017  . Hypokalemia   . Dementia with behavioral disturbance   . Acute respiratory failure with hypoxia (HCC)   . Abdominal pain 07/18/2017  . Colitis 07/18/2017  . COPD (chronic obstructive pulmonary disease) (HCC) 07/18/2017  . Cholelithiasis 05/16/2017  . Hyperchloremic metabolic acidosis 05/12/2017  . AKI (acute kidney injury) (HCC) 05/11/2017  . Leukocytosis 05/11/2017  . Metabolic acidosis 05/11/2017  . Lower extremity weakness 05/11/2017  . ICH (intracerebral hemorrhage) (HCC) 05/26/2014  . Hyponatremia 05/26/2014  . Acute encephalopathy 05/26/2014  . Severe sepsis with septic shock (HCC) 05/26/2014  . CVA (cerebral vascular accident) (HCC) 05/25/2014  . Intracerebral hemorrhage (HCC) 05/25/2014    Palliative Care Assessment & Plan   HPI: 71 y.o.femalewith past medical history of COPD, dementia, HTN, severe depression, ETOH abuse, GI bleed, CVA w/ ICH, colitis, and chronic benzo and opiate useadmitted on 12/2/2018with AMS and poor oral intake.Patient initially hypotensive and received multiple fluid boluses. BP now stable. Unsure if BP is d/t sepsis or hypovolemia. Patient also had AKI, resolving now. CT scan shows mild colitis, improved from last CT. VQ scan negative.  Patient's daughter Joni Reining(Nicole) and son-in-law Freida Busman(Allen) have been involved in care and decision making. They want to avoid invasive procedures and aggressive care; however, they want to continue medical management with antibiotics and fluids.   Per family and SNF staff, patient's baseline is ambulatory and conversational.   [facility]. Of note, recent ED visit on 11/30 for fall at SNF.  Assessment: Patient's  respiratory status has appeared to worsen, increasing tachypnea and accessory muscle use. Her mental status also appears to have worsened.  Daughter and son-in-law at bedside. Concerned that she is suffering. We  discussed the differences between continuing current medical interventions and a comfort care approach. They would like to focus on patient's comfort - tell me that baseline quality of life is very poor and she has been suffering for a long time. Described comfort care approach in detail and need for continuous infusion to control symptoms of dyspnea.   We discussed option of residential hospice depending on how patient does overnight - they request that if she is able to be transferred [facility]. Will evaluate tomorrow patient stability and symptoms. She may be too unstable to tolerate transfer.  All questions and concerns addressed. Emotional support provided.   Recommendations/Plan:  Full comfort care  DNR  Morphine continuous infusion at 2 mg w/ 2 mg boluses q1915min PRN  pain or dyspnea  Robinul 0.2 mg q4hr PRN excessive secretions  Haldol 0.5 mg q4hr PRN agitation  Ativan 1 mg q4hr PRN anxiety  Goals of Care and Additional Recommendations:  Limitations on Scope of Treatment: Full Comfort Care, Minimize Medications, No Artificial Feeding, No Diagnostics, No Glucose Monitoring, No IV Antibiotics, No IV Fluids, No Lab Draws and No Tracheostomy  Code Status:  DNR  Prognosis:   Hours - Days  Discharge Planning:  Anticipated Hospital Death vs. Residential hospice  Care plan was discussed with daughter, son-in-law, bedside nurse, Dr. Sunnie Nielsen  Thank you for allowing the Palliative Medicine Team to assist in the care of this patient.   Time In: 14:30 Time Out: 16:00 Total Time 90 minutes  Prolonged Time Billed  yes       Greater than 50%  of this time was spent counseling and coordinating care related to the above assessment and  plan.  Gerlean Ren, DNP, AGNP-C Palliative Medicine Team Team Phone # [phone]

## 2017-08-27 NOTE — Progress Notes (Signed)
Responded to spiritual care consult to support patient and family.  Family thanked me for looking in on her and them but decline chaplain services at this time. Made family aware that our services were available to them if needed later. Daughter and son-in law at bedside.    08/27/17 1610  Clinical Encounter Type  Visited With Patient and family together;Health care provider  Visit Type Initial;Spiritual support;Patient actively dying  Referral From Nurse  Spiritual Encounters  Spiritual Needs Emotional  Fae PippinWatlington, Jaidan Prevette, Chaplain, [facility], Pager [phone]

## 2017-08-27 NOTE — Care Management Important Message (Signed)
Important Message  Patient Details  Name: Finis BudVickie Khurana MRN: [id number] Date of Birth: [birth year]   Medicare Important Message Given:  Yes    Leone Havenaylor, Shirl Ludington [place], RN 08/27/2017, 4:11 PMImportant Message  Patient Details  Name: Finis BudVickie Willeford MRN: [id number] Date of Birth: [birth year]   Medicare Important Message Given:  Yes    Leone Havenaylor, Danni Leabo Clinton, RN 08/27/2017, 4:11 PM

## 2017-08-28 LAB — CULTURE, BLOOD (ROUTINE X 2)
CULTURE: NO GROWTH
Culture: NO GROWTH
SPECIAL REQUESTS: ADEQUATE

## 2017-08-28 MED ORDER — LORAZEPAM 2 MG/ML IJ SOLN
0.5000 mg | Freq: Four times a day (QID) | INTRAMUSCULAR | Status: DC
  Administered 2017-08-28 – 2017-09-01 (×15): 0.5 mg via INTRAVENOUS
  Filled 2017-08-28 (×15): qty 1

## 2017-08-28 MED ORDER — MORPHINE BOLUS VIA INFUSION
4.0000 mg | INTRAVENOUS | Status: DC | PRN
  Administered 2017-08-29 – 2017-09-01 (×11): 4 mg via INTRAVENOUS
  Filled 2017-08-28: qty 4

## 2017-08-28 MED ORDER — DEXAMETHASONE SODIUM PHOSPHATE 10 MG/ML IJ SOLN
6.0000 mg | INTRAMUSCULAR | Status: AC
  Administered 2017-08-28: 6 mg via INTRAVENOUS

## 2017-08-28 MED ORDER — DEXAMETHASONE SODIUM PHOSPHATE 10 MG/ML IJ SOLN
INTRAMUSCULAR | Status: AC
  Filled 2017-08-28: qty 1

## 2017-08-28 NOTE — Progress Notes (Signed)
Nutrition Brief Note  Chart reviewed. Pt now transitioning to comfort care.  No further nutrition interventions warranted at this time.  Please re-consult as needed.   Bethsaida Siegenthaler A. [place], RD, LDN, CDE Pager: [phone] After hours Pager: [phone]  

## 2017-08-28 NOTE — Progress Notes (Signed)
Palliative care at bedside. Notified this nurse that dressing to IV wet. This nurse went in to check IV site. IV ok but patient weeping from left wrist. Site cleaned and foam applied. Patient transferring to 6N. [facility], all questions answered. Patient's son Trey PaulaJeff at bedside and notified of room change. Patient's belongings transferred with patient.

## 2017-08-28 NOTE — Progress Notes (Signed)
Daily Progress Note   Patient Name: Danielle [name]       Date: 08/28/2017 DOB: 05/09/1946  Age: 71 y.o. MRN#: [id number] Attending Physician: Alba Coryegalado, Belkys A, MD Primary Care Physician: Patient, No Pcp Per Admit Date: 09/15/2017  Reason for Consultation/Follow-up: Non pain symptom management and Pain control  Subjective: Called to see pt 2/2 new wheezing, air hunger  Length of Stay: 5  Current Medications: Scheduled Meds:  . dexamethasone  6 mg Intravenous NOW  . LORazepam  0.5 mg Intravenous Q6H    Continuous Infusions: . morphine 2 mg/hr (08/27/17 1619)    PRN Meds: acetaminophen **OR** acetaminophen, albuterol, antiseptic oral rinse, glycopyrrolate **OR** glycopyrrolate, haloperidol **OR** haloperidol lactate, LORazepam **OR** LORazepam, morphine, [DISCONTINUED] ondansetron **OR** ondansetron (ZOFRAN) IV  Physical Exam  Constitutional: She appears well-nourished.  Acutely ill appearing older female with air hunger, wheezing, restlessness  Pulmonary/Chest: She has wheezes.  Increased work of breathing wheezing  Musculoskeletal: Normal range of motion.  Neurological: She is alert.  Skin: Skin is warm and dry. There is pallor.  No mottling  Psychiatric:  Anxious, restless  Nursing note and vitals reviewed.           Vital Signs: BP (!) 141/74 (BP Location: Left Arm)   Pulse (!) 104   Temp 97.8 F (36.6 C) (Axillary)   Resp 17   SpO2 98%  SpO2: SpO2: 98 % O2 Device: O2 Device: Nasal Cannula O2 Flow Rate: O2 Flow Rate (L/min): 2 L/min  Intake/output summary:   Intake/Output Summary (Last 24 hours) at 08/28/2017 1622 Last data filed at 08/28/2017 [id number] Gross per 24 hour  Intake 11.37 ml  Output 100 ml  Net -88.63 ml   LBM: Last BM Date: 08/27/17 Baseline  Weight:   Most recent weight:         Palliative Assessment/Data:    Flowsheet Rows     Most Recent Value  Intake Tab  Referral Department  Hospitalist  Unit at Time of Referral  ER  Palliative Care Primary Diagnosis  Sepsis/Infectious Disease  Date Notified  08/24/17  Palliative Care Type  New Palliative care  Reason for referral  Clarify Goals of Care  Date of Admission  03-14-2017  Date first seen by Palliative Care  08/24/17  # of days Palliative referral response time  0 Day(s)  #  of days IP prior to Palliative referral  1  Clinical Assessment  Palliative Performance Scale Score  10%  Psychosocial & Spiritual Assessment  Palliative Care Outcomes  Patient/Family meeting held?  Yes  Who was at the meeting?  daughter Joni Reining  Palliative Care Outcomes  Provided end of life care assistance, Provided psychosocial or spiritual support, Changed to focus on comfort  Patient/Family wishes: Interventions discontinued/not started   Mechanical Ventilation, Antibiotics, Trach, BiPAP, Vasopressors, Tube feedings/TPN, PEG, Hemodialysis      Patient Active Problem List   Diagnosis Date Noted  . End of life care   . Tachypnea   . Goals of care, counseling/discussion   . Shock (HCC)   . Palliative care by specialist   . Counseling regarding advanced care planning and goals of care   . Acute kidney failure (HCC) 09/09/2017  . Anemia 09/10/2017  . Hypokalemia   . Dementia with behavioral disturbance   . Acute respiratory failure with hypoxia (HCC)   . Abdominal pain 07/18/2017  . Colitis 07/18/2017  . COPD (chronic obstructive pulmonary disease) (HCC) 07/18/2017  . Cholelithiasis 05/16/2017  . Hyperchloremic metabolic acidosis 05/12/2017  . AKI (acute kidney injury) (HCC) 05/11/2017  . Leukocytosis 05/11/2017  . Metabolic acidosis 05/11/2017  . Lower extremity weakness 05/11/2017  . ICH (intracerebral hemorrhage) (HCC) 05/26/2014  . Hyponatremia 05/26/2014  . Acute encephalopathy  05/26/2014  . Severe sepsis with septic shock (HCC) 05/26/2014  . CVA (cerebral vascular accident) (HCC) 05/25/2014  . Intracerebral hemorrhage (HCC) 05/25/2014    Palliative Care Assessment & Plan   Patient Profile: 71 y.o.femalewith past medical history of COPD, dementia, HTN, severe depression, ETOH abuse, GI bleed, CVA w/ ICH, colitis, and chronic benzo and opiate useadmitted on 12/2/2018with AMS and poor oral intake.Patient initially hypotensive and received multiple fluid boluses. BP now stable. Unsure if BP is d/t sepsis or hypovolemia. Patient also had AKI, resolving now. CT scan shows mild colitis, improved from last CT.VQ scan negative.  Patient's daughter Joni Reining) and son-in-law Freida Busman) have been involved in care and decision making. They want to avoid invasive procedures and aggressive care; however, they want to continue medical management with antibiotics and fluids.   Pt now comfort care. Called to see pt 2/2 to worsening air hunger, new wheezing    Recommendations/Plan:  Give decadron IV 6 mg x 1. Pt was on solumedrol  Air hunger: Up titrate MS04 gtt 3mg /hr and 4mg  q15 min PRN  Neb tx now  Bladder scan for urinary retention. May insert foley for comfort and/or urinary retention  Schedule ativan 0.5 q6 ATC for anxiety 2/2 hypoxia  Goals of Care and Additional Recommendations:  Limitations on Scope of Treatment: Full Comfort Care  Code Status:    Code Status Orders  (From admission, onward)        Start     Ordered   08/27/17 1540  Do not attempt resuscitation (DNR)  Continuous    Question Answer Comment  In the event of cardiac or respiratory ARREST Do not call a "code blue"   In the event of cardiac or respiratory ARREST Do not perform Intubation, CPR, defibrillation or ACLS   In the event of cardiac or respiratory ARREST Use medication by any route, position, wound care, and other measures to relive pain and suffering. May use oxygen, suction  and manual treatment of airway obstruction as needed for comfort.      08/27/17 1543    Code Status History    Date Active  Date Inactive Code Status Order ID Comments User Context   09/21/2017 21:44 08/27/2017 15:43 DNR [id number]  Hillary BowGardner, Jared M, DO ED   09/18/2017 21:39 09/17/2017 21:44 DNR [id number]  Tobey GrimEubanks, Katalina M, NP ED   07/18/2017 17:19 07/27/2017 19:51 DNR [id number]  Roslynn AmbleNestor, Jennings E, MD Inpatient   07/18/2017 02:44 07/18/2017 17:19 Full Code [id number]  Onnie BoerEmokpae, Ejiroghene E, MD Inpatient   05/11/2017 21:03 05/19/2017 19:24 Full Code [id number]  Marinda ElkFeliz Ortiz, Abraham, MD Inpatient   05/26/2014 01:15 05/30/2014 16:52 Full Code [id number]  Kathlene Coteesai, Rahul P, PA-C Inpatient    Advance Directive Documentation     Most Recent Value  Type of Advance Directive  Out of facility DNR (pink MOST or yellow form)  Pre-existing out of facility DNR order (yellow form or pink MOST form)  No data  "MOST" Form in Place?  No data       Prognosis:   Hours - Days  Discharge Planning:  Residential hospice ( order placed by attending; not clear if this is family's goal) vs. Hospital death  Care plan was discussed with Dr. Neale BurlyFreeman  Addendum 1700: Improving.  Pt calm. RR 28/min down from 36/min. No audible wheezing  Thank you for allowing the Palliative Medicine Team to assist in the care of this patient.   Time In: 1600 Time Out: 1700 Total Time 60 min Prolonged Time Billed  no       Greater than 50%  of this time was spent counseling and coordinating care related to the above assessment and plan.  Irean HongSarah Grace Daiya Tamer, NP  Please contact Palliative Medicine Team phone at [phone] for questions and concerns.

## 2017-08-28 NOTE — Progress Notes (Signed)
Palliative Medicine RN Note: Afternoon symptom check. Pt was agitated upon arrival to floor. RN gave SL haloperidol, after which the pt was able to relax. Discussed with Dr Neale BurlyFreeman in case pt begins to have escalating symptoms over the weekend.  Margret ChanceMelanie G. Annalise Mcdiarmid, RN, BSN, [facility] 08/28/2017 3:59 PM Cell [phone] 8:00-4:00 Monday-Friday Office [phone]

## 2017-08-28 NOTE — Progress Notes (Signed)
PROGRESS NOTE    Danielle BudVickie Chap  JYN:[id number] DOB: March 30, 1946 DOA: 09/15/2017 PCP: Patient, No Pcp Per    Brief Narrative: Danielle [name] 71 y.o.[facility]emalefrom [facility] with dementia, HTN, prior GIB, COPD, ETOH abuse in the past, anxiety, CVA . Apparently she had been refusing to eat and drink for a few days prior to admission. She was sent to the ER for "not acting like herself". She was seen in the ER on 11/30 after a fall. At baseline she is confused but able to ambulate. In the ER she she was lethargic and hypotensive with SBP down to 70-80s and received a significant amount of IVF to bring BP up. She was also started on antibiotics for possible infection.     Assessment & Plan:   Principal Problem:   Severe sepsis with septic shock (HCC) Active Problems:   Acute encephalopathy   Dementia with behavioral disturbance   Acute kidney failure (HCC)   Anemia   Goals of care, counseling/discussion   Tachypnea   End of life care  1-Hypotension; Resolved with IV fluids.  Related to hypovolemia vs medications.  V-Q scan negative.  Low grade fever, UA with 50,000 VRE, continue with antibiotics.  MRSA negative, stop IV Vancomycin.   2-Sepsis; had hypotension, treating for UTI. Urine grew ESBL 50,000 Treated with IV fluids and IV antibiotics.  BP stabilized.   Acute encephalopathy; toxic vs metabolic.  Dementia with behavioral disturbance Continue with IV meropenem.  IV ativan and haldol PRN.  TSH, B 12 normal,  RPR negative.  No improvement.   3-Acute respiratory failure, tachypnea, COPD; exacerbation. Treated for PNA also.   IV solumedrol to Q 8 hours.  Nebulizer treatment.  Chest xray negative for pulmonary edema. Has hypernatremia. Avoid lasix.  PRN ativan for anxiety.  DNR, no BIPAP.  Patient with increase work of breathing the morning 12-06. She appears in distress. Discussed with daughter, started  morphine PRN for increase work of breathing. Daughter agrees with  medications to keep her mom comfortable. She will discussed further with palliative care steps moving forward. After discussed with palliative care team, patient was transition to full comfort care.    Hypernatremia; treated initially with D 5 IV fluids.   Dysphagia; fail swallow evaluation.  NPO except med.   Acute renal failure;  Related to hypovolemia,, hypotension.  Improved with fluids.   H/o severe alcohol abuse in the past  Tachy acardia; IV metoprolol/   Anemia- recent GI bleed - chronic- stool hemoccult was negative this time - Niferex on hold  Mild colitis on CT scan; on IV antibiotics.    DVT prophylaxis: SCD Code Status: DNR Family Communication: no family at bedside.  Disposition Plan: to be determine.    Consultants:   Palliative    Procedures: none   Antimicrobials: meropenem.    Subjective: She is resting, does not looks in distress as yesterday   Objective: Vitals:   08/27/17 1126 08/27/17 1445 08/28/17 0407 08/28/17 0732  BP: (!) 136/94     Pulse:   (!) 109   Resp: (!) 41  16   Temp: 99.9 F (37.7 C)  98 F (36.7 C) 97.7 F (36.5 C)  TempSrc: Axillary  Axillary Axillary  SpO2:  97% 97%     Intake/Output Summary (Last 24 hours) at 08/28/2017 0925 Last data filed at 08/28/2017 [id number] Gross per 24 hour  Intake 311.37 ml  Output 100 ml  Net 211.37 ml   There were no vitals filed for this visit.  Examination:  General exam: resting, appears in peace.  Respiratory system: less use of acessory muscle.  Cardiovascular system: S 1, S 2 RRR Gastrointestinal system: Bs present, soft.  Central nervous system: lethargic  Extremities: no edema.      Data Reviewed: I have personally reviewed following labs and imaging studies  CBC: Recent Labs  Lab 2017-08-25 1456 08/24/17 0411 08/25/17 0415 08/26/17 0354 08/27/17 0522  WBC 12.1* 11.9* 11.1* 8.9 15.4*  NEUTROABS 10.0*  --   --   --   --   HGB 9.1* 9.4* 9.3* 9.3* 9.0*  HCT 28.9*  30.4* 30.1* 29.7* 27.6*  MCV 99.3 100.7* 99.7 98.7 96.2  PLT 336 290 420* 395 452*   Basic Metabolic Panel: Recent Labs  Lab 08/24/17 0411 08/24/17 1626 08/25/17 0415 08/26/17 0354 08/27/17 0522  NA 139 137 139 141 147*  K 3.3* 3.4* 3.4* 3.7 3.4*  CL 113* 112* 109 111 117*  CO2 17* 16* 18* 19* 19*  GLUCOSE 76 80 103* 139* 167*  BUN 15 11 11 6 9   CREATININE 1.96* 1.46* 1.02* 0.79 0.74  CALCIUM 7.3* 7.7* 7.8* 7.9* 8.4*   GFR: Estimated Creatinine Clearance: 59.7 mL/min (by C-G formula based on SCr of 0.74 mg/dL). Liver Function Tests: Recent Labs  Lab 2017/08/25 1456  AST 29  ALT 17  ALKPHOS 122  BILITOT 0.2*  PROT 4.9*  ALBUMIN 2.1*   Recent Labs  Lab 2017/08/25 1456  LIPASE 17   No results for input(s): AMMONIA in the last 168 hours. Coagulation Profile: Recent Labs  Lab August 25, 2017 1456  INR 1.15   Cardiac Enzymes: Recent Labs  Lab Aug 25, 2017 1456 08/24/17 0430 08/24/17 1626 08/24/17 2327  TROPONINI <0.03 <0.03 <0.03 0.03*   BNP (last 3 results) No results for input(s): PROBNP in the last 8760 hours. HbA1C: No results for input(s): HGBA1C in the last 72 hours. CBG: Recent Labs  Lab 08/24/17 1855 08/24/17 1939 08/24/17 2348 08/25/17 0304 08/26/17 0824  GLUCAP 125* 97 93 89 116*   Lipid Profile: No results for input(s): CHOL, HDL, LDLCALC, TRIG, CHOLHDL, LDLDIRECT in the last 72 hours. Thyroid Function Tests: Recent Labs    08/26/17 0959  TSH 0.634   Anemia Panel: Recent Labs    08/26/17 0959  VITAMINB12 2,824*   Sepsis Labs: Recent Labs  Lab 08/25/2017 1518 08/24/17 0430 08/25/17 0415 08/26/17 0354  PROCALCITON  --  0.20 0.19 0.15  LATICACIDVEN 1.35  --   --   --     Recent Results (from the past 240 hour(s))  Culture, blood (routine x 2)     Status: None (Preliminary result)   Collection Time: August 25, 2017  4:15 PM  Result Value Ref Range Status   Specimen Description BLOOD RIGHT ANTECUBITAL  Final   Special Requests   Final    IN  PEDIATRIC BOTTLE Blood Culture results may not be optimal due to an excessive volume of blood received in culture bottles   Culture NO GROWTH 4 DAYS  Final   Report Status PENDING  Incomplete  Culture, blood (routine x 2)     Status: None (Preliminary result)   Collection Time: Aug 25, 2017  4:45 PM  Result Value Ref Range Status   Specimen Description BLOOD LEFT ANTECUBITAL  Final   Special Requests   Final    BOTTLES DRAWN AEROBIC AND ANAEROBIC Blood Culture adequate volume   Culture NO GROWTH 4 DAYS  Final   Report Status PENDING  Incomplete  Urine culture  Status: Abnormal   Collection Time: 08/29/2017  6:55 PM  Result Value Ref Range Status   Specimen Description URINE, CLEAN CATCH  Final   Special Requests NONE  Final   Culture (A)  Final    50,000 COLONIES/mL ESCHERICHIA COLI Confirmed Extended Spectrum Beta-Lactamase Producer (ESBL).  In bloodstream infections from ESBL organisms, carbapenems are preferred over piperacillin/tazobactam. They are shown to have a lower risk of mortality.    Report Status 08/26/2017 FINAL  Final   Organism ID, Bacteria ESCHERICHIA COLI (A)  Final      Susceptibility   Escherichia coli - MIC*    AMPICILLIN >=32 RESISTANT Resistant     CEFAZOLIN >=64 RESISTANT Resistant     CEFTRIAXONE RESISTANT Resistant     CIPROFLOXACIN >=4 RESISTANT Resistant     GENTAMICIN <=1 SENSITIVE Sensitive     IMIPENEM <=0.25 SENSITIVE Sensitive     NITROFURANTOIN <=16 SENSITIVE Sensitive     TRIMETH/SULFA <=20 SENSITIVE Sensitive     AMPICILLIN/SULBACTAM 4 SENSITIVE Sensitive     PIP/TAZO <=4 SENSITIVE Sensitive     Extended ESBL POSITIVE Resistant     * 50,000 COLONIES/mL ESCHERICHIA COLI  MRSA PCR Screening     Status: None   Collection Time: 08/24/17  4:34 PM  Result Value Ref Range Status   MRSA by PCR NEGATIVE NEGATIVE Final    Comment:        The GeneXpert MRSA Assay (FDA approved for NASAL specimens only), is one component of a comprehensive MRSA  colonization surveillance program. It is not intended to diagnose MRSA infection nor to guide or monitor treatment for MRSA infections.          Radiology Studies: No results found.      Scheduled Meds:  Continuous Infusions: . morphine 2 mg/hr (08/27/17 1619)     LOS: 5 days    Time spent: 35 minutes.     Alba CoryBelkys A Shanesha Bednarz, MD [facility] Pager [phone]  If 7PM-7AM, please contact night-coverage [email or web address] Password [email or web address] 08/28/2017, 9:25 AM

## 2017-08-28 NOTE — Progress Notes (Signed)
Pt arrived to 6n16 from 90M. Son at bedside

## 2017-08-28 NOTE — Progress Notes (Signed)
Palliative Medicine RN Note: Patient seen before transfer to 6N. Son is at bedside. Pt is awake, squeezing my hand independently, and not following commands. She does not look comfortable. IV dressing saturated, sheets damp under IV site. RN will evaluate for possible site change. Requested she give a prn dose of morphine prior to transfer to 6N to ensure comfortable transition. Plan for PMT f/u this afternoon.  Margret ChanceMelanie G. Yarelie Hams, RN, BSN, [facility] 08/28/2017 12:06 PM Cell [phone] 8:00-4:00 Monday-Friday Office [phone]

## 2017-08-29 NOTE — Progress Notes (Signed)
PROGRESS NOTE    Danielle [name]  ZOX:[id number] DOB: 02-11-1946 DOA: 09/05/2017 PCP: Patient, No Pcp Per    Brief Narrative: Danielle [name] 71 [facility] with dementia, HTN, prior GIB, COPD, ETOH abuse in the past, anxiety, CVA . Apparently she had been refusing to eat and drink for a few days prior to admission. She was sent to the ER for "not acting like herself". She was seen in the ER on 11/30 after a fall. At baseline she is confused but able to ambulate. In the ER she she was lethargic and hypotensive with SBP down to 70-80s and received a significant amount of IVF to bring BP up. She was also started on antibiotics for possible infection.  Assessment & Plan:   Principal Problem:   Severe sepsis with septic shock (HCC) Active Problems:   Acute encephalopathy   Dementia with behavioral disturbance   Acute kidney failure (HCC)   Anemia   Goals of care, counseling/discussion   Tachypnea   End of life care  1-Hypotension; Resolved with IV fluids.  Related to hypovolemia vs medications.  V-Q scan negative.  Low grade fever, UA with 50,000 VRE, continue with antibiotics.  MRSA negative, stop IV Vancomycin.   2-Sepsis; had hypotension, treating for UTI. Urine grew ESBL 50,000 Treated with IV fluids and IV antibiotics.  BP stabilized.   Acute encephalopathy; toxic vs metabolic.  Dementia with behavioral disturbance Continue with IV meropenem.  IV ativan and haldol PRN.  TSH, B 12 normal,  RPR negative.  No improvement.   3-Acute respiratory failure, tachypnea, COPD; exacerbation. Treated for PNA also.   IV solumedrol to Q 8 hours.  Nebulizer treatment.  Chest xray negative for pulmonary edema. Has hypernatremia. Avoid lasix.  PRN ativan for anxiety.  DNR, no BIPAP.  Patient with increase work of breathing the morning 12-06. She appears in distress. Discussed with daughter, started  morphine PRN for increase work of breathing. Daughter agrees with  medications to keep her mom comfortable. She will discussed further with palliative care steps moving forward. After discussed with palliative care team, patient was transition to full comfort care.  Patient is today more lethargic, rr 8, shallow breathing. Not stable to be transfer to residential hospice. Discussed with daughter. Patient will remain in the hospital. Expect hospital death.   Hypernatremia; treated initially with D 5 IV fluids.   Dysphagia; fail swallow evaluation.  NPO except med.   Acute renal failure;  Related to hypovolemia,, hypotension.  Improved with fluids.   H/o severe alcohol abuse in the past  Tachy acardia; IV metoprolol/   Anemia- recent GI bleed - chronic- stool hemoccult was negative this time - Niferex on hold  Mild colitis on CT scan; on IV antibiotics.    DVT prophylaxis: SCD Code Status: DNR Family Communication: no family at bedside.  Disposition Plan: to be determine.    Consultants:   Palliative    Procedures: none   Antimicrobials: meropenem.    Subjective: Lethargic   Objective: Vitals:   08/28/17 2100 08/29/17 0206 08/29/17 0417 08/29/17 1243  BP: (!) 145/85  114/65 (!) 110/59  Pulse: (!) 133 (!) 112 98 88  Resp: 20  18 (!) 8  Temp: 98.2 F (36.8 C)  97.8 F (36.6 C) 98.3 F (36.8 C)  TempSrc: Axillary  Axillary Axillary  SpO2: 98% 100% 100% 95%    Intake/Output Summary (Last 24 hours) at 08/29/2017 1421 Last data filed at 08/29/2017 0907 Gross per 24 hour  Intake 0  ml  Output 1000 ml  Net -1000 ml   There were no vitals filed for this visit.  Examination:  General exam: lethargic.  Respiratory system: shalow breathing  Cardiovascular system:  S 1, S 2  Gastrointestinal system: Bs present, soft.  Central nervous system: lethargic  Extremities: no edema.      Data Reviewed: I have personally reviewed following labs and imaging studies  CBC: Recent Labs  Lab 2017/02/02 1456 08/24/17 0411  08/25/17 0415 08/26/17 0354 08/27/17 0522  WBC 12.1* 11.9* 11.1* 8.9 15.4*  NEUTROABS 10.0*  --   --   --   --   HGB 9.1* 9.4* 9.3* 9.3* 9.0*  HCT 28.9* 30.4* 30.1* 29.7* 27.6*  MCV 99.3 100.7* 99.7 98.7 96.2  PLT 336 290 420* 395 452*   Basic Metabolic Panel: Recent Labs  Lab 08/24/17 0411 08/24/17 1626 08/25/17 0415 08/26/17 0354 08/27/17 0522  NA 139 137 139 141 147*  K 3.3* 3.4* 3.4* 3.7 3.4*  CL 113* 112* 109 111 117*  CO2 17* 16* 18* 19* 19*  GLUCOSE 76 80 103* 139* 167*  BUN 15 11 11 6 9   CREATININE 1.96* 1.46* 1.02* 0.79 0.74  CALCIUM 7.3* 7.7* 7.8* 7.9* 8.4*   GFR: Estimated Creatinine Clearance: 59.7 mL/min (by C-G formula based on SCr of 0.74 mg/dL). Liver Function Tests: Recent Labs  Lab 2017/02/02 1456  AST 29  ALT 17  ALKPHOS 122  BILITOT 0.2*  PROT 4.9*  ALBUMIN 2.1*   Recent Labs  Lab 2017/02/02 1456  LIPASE 17   No results for input(s): AMMONIA in the last 168 hours. Coagulation Profile: Recent Labs  Lab 2017/02/02 1456  INR 1.15   Cardiac Enzymes: Recent Labs  Lab 2017/02/02 1456 08/24/17 0430 08/24/17 1626 08/24/17 2327  TROPONINI <0.03 <0.03 <0.03 0.03*   BNP (last 3 results) No results for input(s): PROBNP in the last 8760 hours. HbA1C: No results for input(s): HGBA1C in the last 72 hours. CBG: Recent Labs  Lab 08/24/17 1855 08/24/17 1939 08/24/17 2348 08/25/17 0304 08/26/17 0824  GLUCAP 125* 97 93 89 116*   Lipid Profile: No results for input(s): CHOL, HDL, LDLCALC, TRIG, CHOLHDL, LDLDIRECT in the last 72 hours. Thyroid Function Tests: No results for input(s): TSH, T4TOTAL, FREET4, T3FREE, THYROIDAB in the last 72 hours. Anemia Panel: No results for input(s): VITAMINB12, FOLATE, FERRITIN, TIBC, IRON, RETICCTPCT in the last 72 hours. Sepsis Labs: Recent Labs  Lab 2017/02/02 1518 08/24/17 0430 08/25/17 0415 08/26/17 0354  PROCALCITON  --  0.20 0.19 0.15  LATICACIDVEN 1.35  --   --   --     Recent Results (from the  past 240 hour(s))  Culture, blood (routine x 2)     Status: None   Collection Time: 2017/02/02  4:15 PM  Result Value Ref Range Status   Specimen Description BLOOD RIGHT ANTECUBITAL  Final   Special Requests   Final    IN PEDIATRIC BOTTLE Blood Culture results may not be optimal due to an excessive volume of blood received in culture bottles   Culture NO GROWTH 5 DAYS  Final   Report Status 08/28/2017 FINAL  Final  Culture, blood (routine x 2)     Status: None   Collection Time: 2017/02/02  4:45 PM  Result Value Ref Range Status   Specimen Description BLOOD LEFT ANTECUBITAL  Final   Special Requests   Final    BOTTLES DRAWN AEROBIC AND ANAEROBIC Blood Culture adequate volume   Culture NO GROWTH  5 DAYS  Final   Report Status 08/28/2017 FINAL  Final  Urine culture     Status: Abnormal   Collection Time: 03-06-2017  6:55 PM  Result Value Ref Range Status   Specimen Description URINE, CLEAN CATCH  Final   Special Requests NONE  Final   Culture (A)  Final    50,000 COLONIES/mL ESCHERICHIA COLI Confirmed Extended Spectrum Beta-Lactamase Producer (ESBL).  In bloodstream infections from ESBL organisms, carbapenems are preferred over piperacillin/tazobactam. They are shown to have a lower risk of mortality.    Report Status 08/26/2017 FINAL  Final   Organism ID, Bacteria ESCHERICHIA COLI (A)  Final      Susceptibility   Escherichia coli - MIC*    AMPICILLIN >=32 RESISTANT Resistant     CEFAZOLIN >=64 RESISTANT Resistant     CEFTRIAXONE RESISTANT Resistant     CIPROFLOXACIN >=4 RESISTANT Resistant     GENTAMICIN <=1 SENSITIVE Sensitive     IMIPENEM <=0.25 SENSITIVE Sensitive     NITROFURANTOIN <=16 SENSITIVE Sensitive     TRIMETH/SULFA <=20 SENSITIVE Sensitive     AMPICILLIN/SULBACTAM 4 SENSITIVE Sensitive     PIP/TAZO <=4 SENSITIVE Sensitive     Extended ESBL POSITIVE Resistant     * 50,000 COLONIES/mL ESCHERICHIA COLI  MRSA PCR Screening     Status: None   Collection Time: 08/24/17  4:34  PM  Result Value Ref Range Status   MRSA by PCR NEGATIVE NEGATIVE Final    Comment:        The GeneXpert MRSA Assay (FDA approved for NASAL specimens only), is one component of a comprehensive MRSA colonization surveillance program. It is not intended to diagnose MRSA infection nor to guide or monitor treatment for MRSA infections.          Radiology Studies: No results found.      Scheduled Meds: . LORazepam  0.5 mg Intravenous Q6H   Continuous Infusions: . morphine 3 mg/hr (08/28/17 1643)     LOS: 6 days    Time spent: 35 minutes.     Danielle CoryBelkys A Tona Qualley, MD [facility] Pager [phone]  If 7PM-7AM, please contact night-coverage [email or web address] Password [email or web address] 08/29/2017, 2:21 PM

## 2017-08-29 NOTE — Progress Notes (Addendum)
Daily Progress Note   Patient Name: Danielle [name]       Date: 08/29/2017 DOB: 02-25-1946  Age: 71 y.o. MRN#: [id number] Attending Physician: Alba Coryegalado, Belkys A, MD Primary Care Physician: Patient, No Pcp Per Admit Date: 09/12/2017  Reason for Consultation/Follow-up: Non pain symptom management, Pain control, Psychosocial/spiritual support and Terminal Care  Subjective: Patient seen, chart reviewed.  No family at the bedside.  No wheezing noted this morning.  Respirations are shallow but no tachypnea.  There are no nonverbal signs of pain such as grimacing.  Minimal boluses for morphine continuous infusion  Length of Stay: 6  Current Medications: Scheduled Meds:  . LORazepam  0.5 mg Intravenous Q6H    Continuous Infusions: . morphine 3 mg/hr (08/28/17 1643)    PRN Meds: acetaminophen **OR** acetaminophen, albuterol, antiseptic oral rinse, glycopyrrolate **OR** glycopyrrolate, haloperidol **OR** haloperidol lactate, LORazepam **OR** LORazepam, morphine, [DISCONTINUED] ondansetron **OR** ondansetron (ZOFRAN) IV  Physical Exam  Constitutional:  Frail, older female; acutely ill Appears to be actively dying  HENT:  Head: Normocephalic and atraumatic.  Cardiovascular:  Irregular Pulses faint  Pulmonary/Chest:  Respirations are shallow but regular No tachypnea, no wheezing  Neurological:  Unresponsive  Skin:  Cool, pale.  Faint mottling noted to knees  Psychiatric:  No psychomotor restlessness observed otherwise unable to test  Nursing note and vitals reviewed.           Vital Signs: BP 114/65 (BP Location: Right Arm)   Pulse 98   Temp 97.8 F (36.6 C) (Axillary)   Resp 18   SpO2 100%  SpO2: SpO2: 100 % O2 Device: O2 Device: Nasal Cannula O2 Flow Rate: O2 Flow Rate  (L/min): 4 L/min  Intake/output summary:   Intake/Output Summary (Last 24 hours) at 08/29/2017 1151 Last data filed at 08/29/2017 [id number] Gross per 24 hour  Intake 0 ml  Output 1000 ml  Net -1000 ml   LBM: Last BM Date: 08/27/17 Baseline Weight:   Most recent weight:         Palliative Assessment/Data:    Flowsheet Rows     Most Recent Value  Intake Tab  Referral Department  Hospitalist  Unit at Time of Referral  ER  Palliative Care Primary Diagnosis  Sepsis/Infectious Disease  Date Notified  08/24/17  Palliative Care Type  New Palliative care  Reason  for referral  Clarify Goals of Care  Date of Admission  11/23/2016  Date first seen by Palliative Care  08/24/17  # of days Palliative referral response time  0 Day(s)  # of days IP prior to Palliative referral  1  Clinical Assessment  Palliative Performance Scale Score  10%  Psychosocial & Spiritual Assessment  Palliative Care Outcomes  Patient/Family meeting held?  Yes  Who was at the meeting?  daughter Joni Reiningicole  Palliative Care Outcomes  Provided end of life care assistance, Provided psychosocial or spiritual support, Changed to focus on comfort  Patient/Family wishes: Interventions discontinued/not started   Mechanical Ventilation, Antibiotics, Trach, BiPAP, Vasopressors, Tube feedings/TPN, PEG, Hemodialysis      Patient Active Problem List   Diagnosis Date Noted  . End of life care   . Tachypnea   . Goals of care, counseling/discussion   . Shock (HCC)   . Palliative care by specialist   . Counseling regarding advanced care planning and goals of care   . Acute kidney failure (HCC) August 13, 2017  . Anemia August 13, 2017  . Hypokalemia   . Dementia with behavioral disturbance   . Acute respiratory failure with hypoxia (HCC)   . Abdominal pain 07/18/2017  . Colitis 07/18/2017  . COPD (chronic obstructive pulmonary disease) (HCC) 07/18/2017  . Cholelithiasis 05/16/2017  . Hyperchloremic metabolic acidosis 05/12/2017  . AKI  (acute kidney injury) (HCC) 05/11/2017  . Leukocytosis 05/11/2017  . Metabolic acidosis 05/11/2017  . Lower extremity weakness 05/11/2017  . ICH (intracerebral hemorrhage) (HCC) 05/26/2014  . Hyponatremia 05/26/2014  . Acute encephalopathy 05/26/2014  . Severe sepsis with septic shock (HCC) 05/26/2014  . CVA (cerebral vascular accident) (HCC) 05/25/2014  . Intracerebral hemorrhage (HCC) 05/25/2014    Palliative Care Assessment & Plan   Patient Profile: 71 y.o.femalewith past medical history of COPD, dementia, HTN, severe depression, ETOH abuse, GI bleed, CVA w/ ICH, colitis, and chronic benzo and opiate useadmitted on 12/2/2018with AMS and poor oral intake.Patient initially hypotensive and received multiple fluid boluses. BP now stable. Unsure if BP is d/t sepsis or hypovolemia. Patient also had AKI, resolving now. CT scan shows mild colitis, improved from last CT.VQ scan negative.  Patient's daughter Joni Reining(Nicole) and son-in-law Freida Busman(Allen) have been involved in care and decision making. They want to avoid invasive procedures and aggressive care; however, they want to continue medical management with antibiotics and fluids.   Pt now comfort care. Called to see pt 2/2 to worsening air hunger, new wheezing on 08/28/2017.  This is improved with up titration of morphine continuous infusion and administration of steroid  Patient appears to be actively dying   Recommendations/Plan:  Patient appears to0 unstable for transfer to residential hospice.  Anticipate a hospital death in hours to days  Updated pt's SIL and dtr. Prepared them for death to be within  hours to perhaps 24 hours.Reiterated that pt is comfortable, no resp distress and signs of anxiety/fear  Continue with morphine continuous infusion at 3 mg an hour as well as scheduled Ativan.  Monitor for need for dosage titration.  Goals of Care and Additional Recommendations:  Limitations on Scope of Treatment: Full Comfort  Care  Code Status:    Code Status Orders  (From admission, onward)        Start     Ordered   08/27/17 1540  Do not attempt resuscitation (DNR)  Continuous    Question Answer Comment  In the event of cardiac or respiratory ARREST Do not call a "code  blue"   In the event of cardiac or respiratory ARREST Do not perform Intubation, CPR, defibrillation or ACLS   In the event of cardiac or respiratory ARREST Use medication by any route, position, wound care, and other measures to relive pain and suffering. May use oxygen, suction and manual treatment of airway obstruction as needed for comfort.      08/27/17 1543    Code Status History    Date Active Date Inactive Code Status Order ID Comments User Context   09/19/2017 21:44 08/27/2017 15:43 DNR [id number]  Hillary Bow, DO ED   09/19/2017 21:39 09-19-17 21:44 DNR [id number]  Tobey Grim, NP ED   07/18/2017 17:19 07/27/2017 19:51 DNR [id number]  Roslynn Amble, MD Inpatient   07/18/2017 02:44 07/18/2017 17:19 Full Code [id number]  Onnie Boer, MD Inpatient   05/11/2017 21:03 05/19/2017 19:24 Full Code [id number]  Marinda Elk, MD Inpatient   05/26/2014 01:15 05/30/2014 16:52 Full Code [id number]  Kathlene Cote, PA-C Inpatient    Advance Directive Documentation     Most Recent Value  Type of Advance Directive  Out of facility DNR (pink MOST or yellow form)  Pre-existing out of facility DNR order (yellow form or pink MOST form)  No data  "MOST" Form in Place?  No data       Prognosis:   Hours - Days  Discharge Planning:  Anticipated Hospital Death  Care plan was discussed with Dr. Sunnie Nielsen  Thank you for allowing the Palliative Medicine Team to assist in the care of this patient.   Time In: 0900 Time Out: 0925 Total Time 25 min Prolonged Time Billed  no       Greater than 50%  of this time was spent counseling and coordinating care related to the above assessment and plan.  Irean Hong,  NP  Please contact Palliative Medicine Team phone at [phone] for questions and concerns.

## 2017-08-30 NOTE — Progress Notes (Signed)
PROGRESS NOTE    Danielle [name]  EUM:[id number] DOB: [birth year] DOA: 08/28/2017 PCP: Patient, No Pcp Per    Brief Narrative: Danielle [name] 71 y.[facility]emalefrom [facility] with dementia, HTN, prior GIB, COPD, ETOH abuse in the past, anxiety, CVA . Apparently she had been refusing to eat and drink for a few days prior to admission. She was sent to the ER for "not acting like herself". She was seen in the ER on 11/30 after a fall. At baseline she is confused but able to ambulate. In the ER she she was lethargic and hypotensive with SBP down to 70-80s and received a significant amount of IVF to bring BP up. She was also started on antibiotics for possible infection.  Assessment & Plan:   Principal Problem:   Severe sepsis with septic shock (HCC) Active Problems:   Acute encephalopathy   Dementia with behavioral disturbance   Acute kidney failure (HCC)   Anemia   Goals of care, counseling/discussion   Tachypnea   End of life care  1-Acute respiratory failure, tachypnea, COPD; exacerbation. Treated for PNA also.  Treated with  IV solumedrol to Q 8 hours. Nebulizer treatment. IV antibiotics.  Chest xray negative for pulmonary edema. Has hypernatremia. Avoid lasix.  PRN ativan for anxiety.  DNR, no BIPAP.  Patient didn't improved with medical treatment. Family met with family, decision was made for full comfort care. Patient was started on morphine Gtt, with improvement of increase work of breathing.  Expect hospital death.    Acute encephalopathy; toxic vs metabolic.  Dementia with behavioral disturbance Treated with IV meropenem.  IV ativan and haldol PRN.  TSH, B 12 normal,  RPR negative.  No improvement with treatment of infection, and support care. No tube feeding per family.   Hypotension; Resolved with IV fluids.  Related to hypovolemia vs medications.  V-Q scan negative.  Low grade fever, UA with 50,000 VRE, continue with antibiotics.  MRSA negative, stop IV Vancomycin.    Sepsis; had hypotension, treating for UTI. Urine grew ESBL 50,000 Treated with IV fluids and IV antibiotics.  BP stabilized.   Hypernatremia; treated initially with D 5 IV fluids.   Dysphagia; fail swallow evaluation.  NPO except med.   Acute renal failure;  Related to hypovolemia,, hypotension.  Improved with fluids.   H/o severe alcohol abuse in the past  Tachy acardia; IV metoprolol/   Anemia- recent GI bleed - chronic- stool hemoccult was negative this time - Niferex on hold  Mild colitis on CT scan; on IV antibiotics.    DVT prophylaxis: SCD Code Status: DNR Family Communication: no family at bedside.  Disposition Plan: to be determine.    Consultants:   Palliative    Procedures: none   Antimicrobials: meropenem.    Subjective: Eyes open, does not follow command, non verbal.  Was agitated earlier. Received ativan.   Objective: Vitals:   08/29/17 1243 08/29/17 2237 08/29/17 2300 08/29/17 2359  BP: (!) 110/59     Pulse: 88 98 95 96  Resp: (!) 8 10 10    Temp: 98.3 F (36.8 C)     TempSrc: Axillary     SpO2: 95% 98% 94% 96%    Intake/Output Summary (Last 24 hours) at 08/30/2017 1104 Last data filed at 08/30/2017 0650 Gross per 24 hour  Intake 94.5 ml  Output 700 ml  Net -605.5 ml   There were no vitals filed for this visit.  Examination:  General exam: no resposive  Respiratory system: use accessory muscle, shallow  breathing.  Cardiovascular system:  S 1, S 2  Gastrointestinal system: BS present, soft, nt Central nervous system: Lethargic Extremities: no edema.      Data Reviewed: I have personally reviewed following labs and imaging studies  CBC: Recent Labs  Lab 08/29/2017 1456 08/24/17 0411 08/25/17 0415 08/26/17 0354 08/27/17 0522  WBC 12.1* 11.9* 11.1* 8.9 15.4*  NEUTROABS 10.0*  --   --   --   --   HGB 9.1* 9.4* 9.3* 9.3* 9.0*  HCT 28.9* 30.4* 30.1* 29.7* 27.6*  MCV 99.3 100.7* 99.7 98.7 96.2  PLT 336 290 420* 395  657*   Basic Metabolic Panel: Recent Labs  Lab 08/24/17 0411 08/24/17 1626 08/25/17 0415 08/26/17 0354 08/27/17 0522  NA 139 137 139 141 147*  K 3.3* 3.4* 3.4* 3.7 3.4*  CL 113* 112* 109 111 117*  CO2 17* 16* 18* 19* 19*  GLUCOSE 76 80 103* 139* 167*  BUN 15 11 11 6 9   CREATININE 1.96* 1.46* 1.02* 0.79 0.74  CALCIUM 7.3* 7.7* 7.8* 7.9* 8.4*   GFR: Estimated Creatinine Clearance: 59.7 mL/min (by C-G formula based on SCr of 0.74 mg/dL). Liver Function Tests: Recent Labs  Lab 08/26/2017 1456  AST 29  ALT 17  ALKPHOS 122  BILITOT 0.2*  PROT 4.9*  ALBUMIN 2.1*   Recent Labs  Lab 09/17/2017 1456  LIPASE 17   No results for input(s): AMMONIA in the last 168 hours. Coagulation Profile: Recent Labs  Lab 09/07/2017 1456  INR 1.15   Cardiac Enzymes: Recent Labs  Lab 09/06/2017 1456 08/24/17 0430 08/24/17 1626 08/24/17 2327  TROPONINI <0.03 <0.03 <0.03 0.03*   BNP (last 3 results) No results for input(s): PROBNP in the last 8760 hours. HbA1C: No results for input(s): HGBA1C in the last 72 hours. CBG: Recent Labs  Lab 08/24/17 1855 08/24/17 1939 08/24/17 2348 08/25/17 0304 08/26/17 0824  GLUCAP 125* 97 93 89 116*   Lipid Profile: No results for input(s): CHOL, HDL, LDLCALC, TRIG, CHOLHDL, LDLDIRECT in the last 72 hours. Thyroid Function Tests: No results for input(s): TSH, T4TOTAL, FREET4, T3FREE, THYROIDAB in the last 72 hours. Anemia Panel: No results for input(s): VITAMINB12, FOLATE, FERRITIN, TIBC, IRON, RETICCTPCT in the last 72 hours. Sepsis Labs: Recent Labs  Lab 09/02/2017 1518 08/24/17 0430 08/25/17 0415 08/26/17 0354  PROCALCITON  --  0.20 0.19 0.15  LATICACIDVEN 1.35  --   --   --     Recent Results (from the past 240 hour(s))  Culture, blood (routine x 2)     Status: None   Collection Time: 09/21/2017  4:15 PM  Result Value Ref Range Status   Specimen Description BLOOD RIGHT ANTECUBITAL  Final   Special Requests   Final    IN PEDIATRIC  BOTTLE Blood Culture results may not be optimal due to an excessive volume of blood received in culture bottles   Culture NO GROWTH 5 DAYS  Final   Report Status 08/28/2017 FINAL  Final  Culture, blood (routine x 2)     Status: None   Collection Time: 09/05/2017  4:45 PM  Result Value Ref Range Status   Specimen Description BLOOD LEFT ANTECUBITAL  Final   Special Requests   Final    BOTTLES DRAWN AEROBIC AND ANAEROBIC Blood Culture adequate volume   Culture NO GROWTH 5 DAYS  Final   Report Status 08/28/2017 FINAL  Final  Urine culture     Status: Abnormal   Collection Time: 09/16/2017  6:55 PM  Result Value Ref Range Status   Specimen Description URINE, CLEAN CATCH  Final   Special Requests NONE  Final   Culture (A)  Final    50,000 COLONIES/mL ESCHERICHIA COLI Confirmed Extended Spectrum Beta-Lactamase Producer (ESBL).  In bloodstream infections from ESBL organisms, carbapenems are preferred over piperacillin/tazobactam. They are shown to have a lower risk of mortality.    Report Status 08/26/2017 FINAL  Final   Organism ID, Bacteria ESCHERICHIA COLI (A)  Final      Susceptibility   Escherichia coli - MIC*    AMPICILLIN >=32 RESISTANT Resistant     CEFAZOLIN >=64 RESISTANT Resistant     CEFTRIAXONE RESISTANT Resistant     CIPROFLOXACIN >=4 RESISTANT Resistant     GENTAMICIN <=1 SENSITIVE Sensitive     IMIPENEM <=0.25 SENSITIVE Sensitive     NITROFURANTOIN <=16 SENSITIVE Sensitive     TRIMETH/SULFA <=20 SENSITIVE Sensitive     AMPICILLIN/SULBACTAM 4 SENSITIVE Sensitive     PIP/TAZO <=4 SENSITIVE Sensitive     Extended ESBL POSITIVE Resistant     * 50,000 COLONIES/mL ESCHERICHIA COLI  MRSA PCR Screening     Status: None   Collection Time: 08/24/17  4:34 PM  Result Value Ref Range Status   MRSA by PCR NEGATIVE NEGATIVE Final    Comment:        The GeneXpert MRSA Assay (FDA approved for NASAL specimens only), is one component of a comprehensive MRSA colonization surveillance  program. It is not intended to diagnose MRSA infection nor to guide or monitor treatment for MRSA infections.          Radiology Studies: No results found.      Scheduled Meds: . LORazepam  0.5 mg Intravenous Q6H   Continuous Infusions: . morphine 3 mg/hr (08/28/17 1643)     LOS: 7 days    Time spent: 35 minutes.     Elmarie Shiley, MD [facility] Pager [phone]  If 7PM-7AM, please contact night-coverage [email or web address] Password [email or web address] 08/30/2017, 11:04 AM

## 2017-08-31 NOTE — Progress Notes (Signed)
PROGRESS NOTE    Danielle [name]  AQT:[id number] DOB: 14-Mar-1946 DOA: 09/11/2017 PCP: Patient, No Pcp Per    Brief Narrative: Danielle [name] 71 y.[facility] with dementia, HTN, prior GIB, COPD, ETOH abuse in the past, anxiety, CVA . Apparently she had been refusing to eat and drink for a few days prior to admission. She was sent to the ER for "not acting like herself". She was seen in the ER on 11/30 after a fall. At baseline she is confused but able to ambulate. In the ER she she was lethargic and hypotensive with SBP down to 70-80s and received a significant amount of IVF to bring BP up. She was also started on antibiotics for possible infection.  Assessment & Plan:   Principal Problem:   Severe sepsis with septic shock (HCC) Active Problems:   Acute encephalopathy   Dementia with behavioral disturbance   Acute kidney failure (HCC)   Anemia   Goals of care, counseling/discussion   Tachypnea   End of life care  1-Acute respiratory failure, tachypnea, COPD; exacerbation. Treated for PNA also.  Treated with  IV solumedrol to Q 8 hours. Nebulizer treatment. IV antibiotics.  Chest xray negative for pulmonary edema. Has hypernatremia. Avoid lasix.  PRN ativan for anxiety.  DNR, no BIPAP.  Patient didn't improved with medical treatment. Family met with family, decision was made for full comfort care. Patient was started on morphine Gtt, with improvement of increase work of breathing.  No significant changes over last 24 hours. Will consider SW consult tomorrow for residential hospice if patient is stable.   Acute encephalopathy; toxic vs metabolic.  Dementia with behavioral disturbance Treated with IV meropenem.  IV ativan and haldol PRN.  TSH, B 12 normal,  RPR negative.  No improvement with treatment of infection, and support care. No tube feeding per family.   Hypotension; Resolved with IV fluids.  Related to hypovolemia vs medications.  V-Q scan negative.  Low  grade fever, UA with 50,000 VRE, continue with antibiotics.  MRSA negative, stop IV Vancomycin.   Sepsis; had hypotension, treating for UTI. Urine grew ESBL 50,000 Treated with IV fluids and IV antibiotics.  BP stabilized.   Hypernatremia; treated initially with D 5 IV fluids.   Dysphagia; fail swallow evaluation.  NPO except med.   Acute renal failure;  Related to hypovolemia,, hypotension.  Improved with fluids.   H/o severe alcohol abuse in the past  Tachy acardia; IV metoprolol/   Anemia- recent GI bleed - chronic- stool hemoccult was negative this time - Niferex on hold  Mild colitis on CT scan; on IV antibiotics.    DVT prophylaxis: SCD Code Status: DNR Family Communication: no family at bedside.  Disposition Plan: to be determine.    Consultants:   Palliative    Procedures: none   Antimicrobials: meropenem.    Subjective: Lethargic.   Objective: Vitals:   08/29/17 1243 08/29/17 2237 08/29/17 2300 08/29/17 2359  BP: (!) 110/59     Pulse: 88 98 95 96  Resp: (!) 8 10 10    Temp: 98.3 F (36.8 C)     TempSrc: Axillary     SpO2: 95% 98% 94% 96%    Intake/Output Summary (Last 24 hours) at 08/31/2017 1047 Last data filed at 08/31/2017 0135 Gross per 24 hour  Intake 68.25 ml  Output 525 ml  Net -456.75 ml   There were no vitals filed for this visit.  Examination:  General exam: no responsive. Comfortable.  Respiratory system: tachypnea,  bilateral ronchus;  Cardiovascular system:  S 1, S 2 RRR Gastrointestinal system: soft.  Central nervous system: Lethargic      Data Reviewed: I have personally reviewed following labs and imaging studies  CBC: Recent Labs  Lab 08/25/17 0415 08/26/17 0354 08/27/17 0522  WBC 11.1* 8.9 15.4*  HGB 9.3* 9.3* 9.0*  HCT 30.1* 29.7* 27.6*  MCV 99.7 98.7 96.2  PLT 420* 395 831*   Basic Metabolic Panel: Recent Labs  Lab 08/24/17 1626 08/25/17 0415 08/26/17 0354 08/27/17 0522  NA 137 139 141 147*   K 3.4* 3.4* 3.7 3.4*  CL 112* 109 111 117*  CO2 16* 18* 19* 19*  GLUCOSE 80 103* 139* 167*  BUN 11 11 6 9   CREATININE 1.46* 1.02* 0.79 0.74  CALCIUM 7.7* 7.8* 7.9* 8.4*   GFR: Estimated Creatinine Clearance: 59.7 mL/min (by C-G formula based on SCr of 0.74 mg/dL). Liver Function Tests: No results for input(s): AST, ALT, ALKPHOS, BILITOT, PROT, ALBUMIN in the last 168 hours. No results for input(s): LIPASE, AMYLASE in the last 168 hours. No results for input(s): AMMONIA in the last 168 hours. Coagulation Profile: No results for input(s): INR, PROTIME in the last 168 hours. Cardiac Enzymes: Recent Labs  Lab 08/24/17 1626 08/24/17 2327  TROPONINI <0.03 0.03*   BNP (last 3 results) No results for input(s): PROBNP in the last 8760 hours. HbA1C: No results for input(s): HGBA1C in the last 72 hours. CBG: Recent Labs  Lab 08/24/17 1855 08/24/17 1939 08/24/17 2348 08/25/17 0304 08/26/17 0824  GLUCAP 125* 97 93 89 116*   Lipid Profile: No results for input(s): CHOL, HDL, LDLCALC, TRIG, CHOLHDL, LDLDIRECT in the last 72 hours. Thyroid Function Tests: No results for input(s): TSH, T4TOTAL, FREET4, T3FREE, THYROIDAB in the last 72 hours. Anemia Panel: No results for input(s): VITAMINB12, FOLATE, FERRITIN, TIBC, IRON, RETICCTPCT in the last 72 hours. Sepsis Labs: Recent Labs  Lab 08/25/17 0415 08/26/17 0354  PROCALCITON 0.19 0.15    Recent Results (from the past 240 hour(s))  Culture, blood (routine x 2)     Status: None   Collection Time: 08/22/2017  4:15 PM  Result Value Ref Range Status   Specimen Description BLOOD RIGHT ANTECUBITAL  Final   Special Requests   Final    IN PEDIATRIC BOTTLE Blood Culture results may not be optimal due to an excessive volume of blood received in culture bottles   Culture NO GROWTH 5 DAYS  Final   Report Status 08/28/2017 FINAL  Final  Culture, blood (routine x 2)     Status: None   Collection Time: 09/16/2017  4:45 PM  Result Value Ref  Range Status   Specimen Description BLOOD LEFT ANTECUBITAL  Final   Special Requests   Final    BOTTLES DRAWN AEROBIC AND ANAEROBIC Blood Culture adequate volume   Culture NO GROWTH 5 DAYS  Final   Report Status 08/28/2017 FINAL  Final  Urine culture     Status: Abnormal   Collection Time: 09/14/2017  6:55 PM  Result Value Ref Range Status   Specimen Description URINE, CLEAN CATCH  Final   Special Requests NONE  Final   Culture (A)  Final    50,000 COLONIES/mL ESCHERICHIA COLI Confirmed Extended Spectrum Beta-Lactamase Producer (ESBL).  In bloodstream infections from ESBL organisms, carbapenems are preferred over piperacillin/tazobactam. They are shown to have a lower risk of mortality.    Report Status 08/26/2017 FINAL  Final   Organism ID, Bacteria ESCHERICHIA COLI (A)  Final      Susceptibility   Escherichia coli - MIC*    AMPICILLIN >=32 RESISTANT Resistant     CEFAZOLIN >=64 RESISTANT Resistant     CEFTRIAXONE RESISTANT Resistant     CIPROFLOXACIN >=4 RESISTANT Resistant     GENTAMICIN <=1 SENSITIVE Sensitive     IMIPENEM <=0.25 SENSITIVE Sensitive     NITROFURANTOIN <=16 SENSITIVE Sensitive     TRIMETH/SULFA <=20 SENSITIVE Sensitive     AMPICILLIN/SULBACTAM 4 SENSITIVE Sensitive     PIP/TAZO <=4 SENSITIVE Sensitive     Extended ESBL POSITIVE Resistant     * 50,000 COLONIES/mL ESCHERICHIA COLI  MRSA PCR Screening     Status: None   Collection Time: 08/24/17  4:34 PM  Result Value Ref Range Status   MRSA by PCR NEGATIVE NEGATIVE Final    Comment:        The GeneXpert MRSA Assay (FDA approved for NASAL specimens only), is one component of a comprehensive MRSA colonization surveillance program. It is not intended to diagnose MRSA infection nor to guide or monitor treatment for MRSA infections.          Radiology Studies: No results found.      Scheduled Meds: . LORazepam  0.5 mg Intravenous Q6H   Continuous Infusions: . morphine 3 mg/hr (08/31/17 0135)       LOS: 8 days    Time spent: 35 minutes.     Elmarie Shiley, MD [facility] Pager [phone]  If 7PM-7AM, please contact night-coverage [email or web address] Password [email or web address] 08/31/2017, 10:47 AM

## 2017-09-01 MED ORDER — LORAZEPAM 2 MG/ML IJ SOLN
1.0000 mg | INTRAMUSCULAR | Status: DC
  Administered 2017-09-01 (×2): 1 mg via INTRAVENOUS
  Filled 2017-09-01 (×2): qty 1

## 2017-09-22 NOTE — Plan of Care (Signed)
  Clinical Measurements: Ability to maintain clinical measurements within normal limits will improve 06/20/17 0445 - Progressing by Luther Redourgott, Karthik Whittinghill, RN   Clinical Measurements: Respiratory complications will improve 06/20/17 0445 - Progressing by Luther Redourgott, Naveya Ellerman, RN

## 2017-09-22 NOTE — Progress Notes (Signed)
PROGRESS NOTE    Danielle [name]  WCB:[id number] DOB: Jul 20, 1946 DOA: 08/22/2017 PCP: Patient, No Pcp Per    Brief Narrative: Danielle [name] 72 y.o[facility]emalefrom SNF with dementia, HTN, prior GIB, COPD, ETOH abuse in the past, anxiety, CVA . Apparently she had been refusing to eat and drink for a few days prior to admission. She was sent to the ER for "not acting like herself". She was seen in the ER on 11/30 after a fall. At baseline she is confused but able to ambulate. In the ER she she was lethargic and hypotensive with SBP down to 70-80s and received a significant amount of IVF to bring BP up. She was also started on antibiotics for possible infection.  Patient admitted with encephalopathy, sepsis, respiratory failure. She was treated with IV fluids, IV antibiotics, nebulizer.  Patient mental status and respiratory status  didn't improved with medical treatment. Family met with family, decision was made for full comfort care. Patient was started on morphine Gtt, with improvement of increase work of breathing.  She remain in morphine gtt. Stable for last 24 hours. Await palliative follow up to determine if patient can be transfer to residential hospice.   Assessment & Plan:   Principal Problem:   Severe sepsis with septic shock (HCC) Active Problems:   Acute encephalopathy   Dementia with behavioral disturbance   Acute kidney failure (HCC)   Anemia   Goals of care, counseling/discussion   Tachypnea   End of life care  1-Acute respiratory failure, tachypnea, COPD; exacerbation. Treated for PNA also.  Treated with  IV solumedrol to Q 8 hours. Nebulizer treatment. IV antibiotics.  Chest xray negative for pulmonary edema. Has hypernatremia. Avoid lasix.  PRN ativan for anxiety.  DNR, no BIPAP.  Patient didn't improved with medical treatment. Family met with family, decision was made for full comfort care. Patient was started on morphine Gtt, with improvement of increase work of  breathing.  No significant changes. Await palliative follow up today , to determine transfer to residential hospice.   Acute encephalopathy; toxic vs metabolic.  Dementia with behavioral disturbance Treated with IV meropenem.  IV ativan and haldol PRN.  TSH, B 12 normal,  RPR negative.  No improvement with treatment of infection, and support care. No tube feeding per family.   Hypotension; Resolved with IV fluids.  Related to hypovolemia vs medications.  V-Q scan negative.  Low grade fever, UA with 50,000 VRE, continue with antibiotics.  MRSA negative, stop IV Vancomycin.   Sepsis; had hypotension, treating for UTI. Urine grew ESBL 50,000 Treated with IV fluids and IV antibiotics.  BP stabilized.   Hypernatremia; treated initially with D 5 IV fluids.   Dysphagia; fail swallow evaluation.  NPO except med.   Acute renal failure;  Related to hypovolemia,, hypotension.  Improved with fluids.   H/o severe alcohol abuse in the past  Tachy acardia; IV metoprolol/   Anemia- recent GI bleed - chronic- stool hemoccult was negative this time - Niferex on hold  Mild colitis on CT scan; on IV antibiotics.    DVT prophylaxis: SCD Code Status: DNR Family Communication: no family at bedside.  Disposition Plan: to be determine.    Consultants:   Palliative    Procedures: none   Antimicrobials: meropenem.    Subjective: Lethargic.   Objective: Vitals:   08/29/17 2359 08/31/17 1300 08/31/17 2101 2017/09/14 0500  BP:  108/60 105/65 (!) 102/58  Pulse: 96 92 90 (!) 118  Resp:   (!) 22  Temp:  98 F (36.7 C) 97.8 F (36.6 C) 98.5 F (36.9 C)  TempSrc:  Axillary Axillary Axillary  SpO2: 96% 97% 99% (!) 82%    Intake/Output Summary (Last 24 hours) at 09/15/17 1345 Last data filed at 09-15-2017 5374 Gross per 24 hour  Intake 0 ml  Output 650 ml  Net -650 ml   There were no vitals filed for this visit.  Examination:  General exam: Lethargic, tachypnea.    Respiratory system: bilateral ronchus.  Cardiovascular system:  S 1, S 2 RRR Gastrointestinal system: soft.  Central nervous system:lethargic.       Data Reviewed: I have personally reviewed following labs and imaging studies  CBC: Recent Labs  Lab 08/26/17 0354 08/27/17 0522  WBC 8.9 15.4*  HGB 9.3* 9.0*  HCT 29.7* 27.6*  MCV 98.7 96.2  PLT 395 827*   Basic Metabolic Panel: Recent Labs  Lab 08/26/17 0354 08/27/17 0522  NA 141 147*  K 3.7 3.4*  CL 111 117*  CO2 19* 19*  GLUCOSE 139* 167*  BUN 6 9  CREATININE 0.79 0.74  CALCIUM 7.9* 8.4*   GFR: Estimated Creatinine Clearance: 59.7 mL/min (by C-G formula based on SCr of 0.74 mg/dL). Liver Function Tests: No results for input(s): AST, ALT, ALKPHOS, BILITOT, PROT, ALBUMIN in the last 168 hours. No results for input(s): LIPASE, AMYLASE in the last 168 hours. No results for input(s): AMMONIA in the last 168 hours. Coagulation Profile: No results for input(s): INR, PROTIME in the last 168 hours. Cardiac Enzymes: No results for input(s): CKTOTAL, CKMB, CKMBINDEX, TROPONINI in the last 168 hours. BNP (last 3 results) No results for input(s): PROBNP in the last 8760 hours. HbA1C: No results for input(s): HGBA1C in the last 72 hours. CBG: Recent Labs  Lab 08/26/17 0824  GLUCAP 116*   Lipid Profile: No results for input(s): CHOL, HDL, LDLCALC, TRIG, CHOLHDL, LDLDIRECT in the last 72 hours. Thyroid Function Tests: No results for input(s): TSH, T4TOTAL, FREET4, T3FREE, THYROIDAB in the last 72 hours. Anemia Panel: No results for input(s): VITAMINB12, FOLATE, FERRITIN, TIBC, IRON, RETICCTPCT in the last 72 hours. Sepsis Labs: Recent Labs  Lab 08/26/17 0354  PROCALCITON 0.15    Recent Results (from the past 240 hour(s))  Culture, blood (routine x 2)     Status: None   Collection Time: 08/24/2017  4:15 PM  Result Value Ref Range Status   Specimen Description BLOOD RIGHT ANTECUBITAL  Final   Special Requests    Final    IN PEDIATRIC BOTTLE Blood Culture results may not be optimal due to an excessive volume of blood received in culture bottles   Culture NO GROWTH 5 DAYS  Final   Report Status 08/28/2017 FINAL  Final  Culture, blood (routine x 2)     Status: None   Collection Time: 09/05/2017  4:45 PM  Result Value Ref Range Status   Specimen Description BLOOD LEFT ANTECUBITAL  Final   Special Requests   Final    BOTTLES DRAWN AEROBIC AND ANAEROBIC Blood Culture adequate volume   Culture NO GROWTH 5 DAYS  Final   Report Status 08/28/2017 FINAL  Final  Urine culture     Status: Abnormal   Collection Time: 08/29/2017  6:55 PM  Result Value Ref Range Status   Specimen Description URINE, CLEAN CATCH  Final   Special Requests NONE  Final   Culture (A)  Final    50,000 COLONIES/mL ESCHERICHIA COLI Confirmed Extended Spectrum Beta-Lactamase Producer (ESBL).  In  bloodstream infections from ESBL organisms, carbapenems are preferred over piperacillin/tazobactam. They are shown to have a lower risk of mortality.    Report Status 08/26/2017 FINAL  Final   Organism ID, Bacteria ESCHERICHIA COLI (A)  Final      Susceptibility   Escherichia coli - MIC*    AMPICILLIN >=32 RESISTANT Resistant     CEFAZOLIN >=64 RESISTANT Resistant     CEFTRIAXONE RESISTANT Resistant     CIPROFLOXACIN >=4 RESISTANT Resistant     GENTAMICIN <=1 SENSITIVE Sensitive     IMIPENEM <=0.25 SENSITIVE Sensitive     NITROFURANTOIN <=16 SENSITIVE Sensitive     TRIMETH/SULFA <=20 SENSITIVE Sensitive     AMPICILLIN/SULBACTAM 4 SENSITIVE Sensitive     PIP/TAZO <=4 SENSITIVE Sensitive     Extended ESBL POSITIVE Resistant     * 50,000 COLONIES/mL ESCHERICHIA COLI  MRSA PCR Screening     Status: None   Collection Time: 08/24/17  4:34 PM  Result Value Ref Range Status   MRSA by PCR NEGATIVE NEGATIVE Final    Comment:        The GeneXpert MRSA Assay (FDA approved for NASAL specimens only), is one component of a comprehensive MRSA  colonization surveillance program. It is not intended to diagnose MRSA infection nor to guide or monitor treatment for MRSA infections.          Radiology Studies: No results found.      Scheduled Meds: . LORazepam  0.5 mg Intravenous Q6H   Continuous Infusions: . morphine 3 mg/hr (08/31/17 0135)     LOS: 9 days    Time spent: 35 minutes.     Elmarie Shiley, MD [facility] Pager [phone]  If 7PM-7AM, please contact night-coverage [email or web address] Password [email or web address] 07-Sep-2017, 1:45 PM

## 2017-09-22 NOTE — Progress Notes (Signed)
Daily Progress Note   Patient Name: Danielle [name]       Date: 09/21/2017 DOB: 12/04/1945  Age: 72 y.o. MRN#: [id number] Attending Physician: Alba Coryegalado, Belkys A, MD Primary Care Physician: Patient, No Pcp Per Admit Date: 08/30/2017  Reason for Consultation/Follow-up: Establishing goals of care, Hospice Evaluation, Non pain symptom management, Pain control, Psychosocial/spiritual support and Terminal Care  Subjective: Patient unresponsive. No family at bedside. Chart review shows 1 bolus of morphine ~9 am and robinul ~11 am.  Length of Stay: 9  Current Medications: Scheduled Meds:  . LORazepam  0.5 mg Intravenous Q6H    Continuous Infusions: . morphine 3 mg/hr (08/31/17 0135)    PRN Meds: acetaminophen **OR** acetaminophen, albuterol, antiseptic oral rinse, glycopyrrolate **OR** glycopyrrolate, haloperidol **OR** haloperidol lactate, LORazepam **OR** LORazepam, morphine, [DISCONTINUED] ondansetron **OR** ondansetron (ZOFRAN) IV  Physical Exam  Constitutional:  Eyes open but unresponsive, ill appearing, frail  HENT:  Head: Normocephalic and atraumatic.  Cardiovascular: Regular rhythm and intact distal pulses. Tachycardia present.  Pulmonary/Chest: Accessory muscle usage present. Tachypnea noted. She has decreased breath sounds in the right lower field and the left lower field. She has rhonchi in the right upper field and the left upper field.  Genitourinary:  Genitourinary Comments: Foley in place w/urine  Musculoskeletal:  +1 edema all extremities  Neurological:  Unresponsive, does not follow commands  Skin: Skin is warm and dry. She is not diaphoretic.            Vital Signs: BP (!) 102/58 (BP Location: Left Arm)   Pulse (!) 118   Temp 98.5 F (36.9 C) (Axillary)   Resp (!) 22    SpO2 (!) 82%  SpO2: SpO2: (!) 82 % O2 Device: O2 Device: Nasal Cannula O2 Flow Rate: O2 Flow Rate (L/min): 2 L/min  Intake/output summary:   Intake/Output Summary (Last 24 hours) at 08/26/2017 1417 Last data filed at 08/23/2017 [id number] Gross per 24 hour  Intake 0 ml  Output 650 ml  Net -650 ml   LBM: Last BM Date: 08/28/17 Baseline Weight:   Most recent weight:         Palliative Assessment/Data: 10%    Flowsheet Rows     Most Recent Value  Intake Tab  Referral Department  Hospitalist  Unit at Time of Referral  ER  Palliative Care Primary Diagnosis  Sepsis/Infectious Disease  Date Notified  08/24/17  Palliative Care Type  New Palliative care  Reason for referral  Clarify Goals of Care  Date of Admission  10-05-2016  Date first seen by Palliative Care  08/24/17  # of days Palliative referral response time  0 Day(s)  # of days IP prior to Palliative referral  1  Clinical Assessment  Palliative Performance Scale Score  10%  Psychosocial & Spiritual Assessment  Palliative Care Outcomes  Patient/Family meeting held?  Yes  Who was at the meeting?  daughter Joni Reiningicole  Palliative Care Outcomes  Provided end of life care assistance, Provided psychosocial or spiritual support, Changed to focus on comfort  Patient/Family wishes: Interventions discontinued/not started   Mechanical Ventilation, Antibiotics, Trach, BiPAP, Vasopressors, Tube feedings/TPN, PEG, Hemodialysis      Patient Active Problem List   Diagnosis Date Noted  .  End of life care   . Tachypnea   . Goals of care, counseling/discussion   . Shock (HCC)   . Palliative care by specialist   . Counseling regarding advanced care planning and goals of care   . Acute kidney failure (HCC) 2017/08/27  . Anemia 2017/08/27  . Hypokalemia   . Dementia with behavioral disturbance   . Acute respiratory failure with hypoxia (HCC)   . Abdominal pain 07/18/2017  . Colitis 07/18/2017  . COPD (chronic obstructive pulmonary disease)  (HCC) 07/18/2017  . Cholelithiasis 05/16/2017  . Hyperchloremic metabolic acidosis 05/12/2017  . AKI (acute kidney injury) (HCC) 05/11/2017  . Leukocytosis 05/11/2017  . Metabolic acidosis 05/11/2017  . Lower extremity weakness 05/11/2017  . ICH (intracerebral hemorrhage) (HCC) 05/26/2014  . Hyponatremia 05/26/2014  . Acute encephalopathy 05/26/2014  . Severe sepsis with septic shock (HCC) 05/26/2014  . CVA (cerebral vascular accident) (HCC) 05/25/2014  . Intracerebral hemorrhage (HCC) 05/25/2014    Palliative Care Assessment & Plan   HPI: 72 y.o.femalewith past medical history of COPD, dementia, HTN, severe depression, ETOH abuse, GI bleed, CVA w/ ICH, colitis, and chronic benzo and opiate useadmitted on 12/2/2018with AMS and poor oral intake.Patient initially hypotensive and received multiple fluid boluses. BP now stable. Unsure if BP is d/t sepsis or hypovolemia. Patient also had AKI, resolving now. CT scan shows mild colitis, improved from last CT.VQ scan negative.  Per family and SNF staff, patient's baseline is ambulatory and conversational. [facility]. Of note, recent ED visit on 11/30 for fall at SNF.  Patient's daughter Joni Reining(Nicole) and son-in-law Freida Busman(Allen) have been involved in care and decision making. They want to avoid invasive procedures and aggressive care; however, they want to continue medical management with antibiotics and fluids.   Decided to transition to comfort care 12/6 after conversation with Joni ReiningNicole and Freida BusmanAllen.  Assessment: Upon arrival to assess patient, she appears to be actively dying. Patient appeared uncomfortable - agitated and tachypneic.  Given morphine boluses for dyspnea - 1 bolus at 14:12 and 1 bolus at 14:30. After bolus administration, breathing is less labored and patient appears more comfortable.   Updated daughter, Joni Reiningicole, with patient's condition. All questions addressed and emotional support provided.    Recommendations/Plan:  Increase morphine basal rate to 4 mg/hr d/t tachypnea  Increase scheduled ativan to 1 mg q4hr for agitation  Pt appears too unstable for transfer to residential hospice - would not recommend. Anticipate hospital death.   Goals of Care and Additional Recommendations:  Limitations on Scope of Treatment: Full Comfort Care, No Artificial Feeding, No Diagnostics, No Glucose Monitoring, No IV Antibiotics, No IV Fluids and No Lab Draws  Code Status:  DNR  Prognosis:   Hours - Days  Discharge Planning:  Anticipated Hospital Death  Care plan was discussed with palliative care nurse, bedside nurse, patient's daughter Beacher Mayicole  Thank you for allowing the Palliative Medicine Team to assist in the care of this patient.   Total Time 40 minutes Prolonged Time Billed  no       Greater than 50%  of this time was spent counseling and coordinating care related to the above assessment and plan.  Gerlean RenShae Lee Felcia Huebert, DNP, AGNP-C Palliative Medicine Team Team Phone # [phone]

## 2017-09-22 NOTE — Discharge Summary (Signed)
Death Summary  Danielle [name] RXY:[id number] DOB: 06/03/1946 DOA: 09-02-2017  PCP: Patient, No Pcp Per   Admit date: 2017/09/02 Date of Death: 2017/09/18  Final Diagnoses:    Acute respiratory failure, tachypnea, COPD;   Severe sepsis with septic shock ([place])   Acute encephalopathy   Dementia with behavioral disturbance   Acute kidney failure ([place])   Anemia   Goals of care, counseling/discussion   Tachypnea   End of life care     History of present illness:  SNFwith dementia, HTN, prior GIB, COPD, ETOH abuse in the past, anxiety, CVA . Apparently she had been refusing to eat and drink for a few days prior to admission. She was sent to the ER for "not acting like herself". She was seen in the ER on 11/30 after a fall. At baseline she is confused but able to ambulate. In the ER she she was lethargic and hypotensive with SBP down to 70-80s and received a significant amount of IVF to bring BP up. She was also started on antibiotics for possible infection.  Patient admitted with encephalopathy, sepsis, respiratory failure. She was treated with IV fluids, IV antibiotics, nebulizer.  Patient mental status and respiratory status  didn't improved with medical treatment. Family met with family, decision was made for full comfort care. Patient was started on morphine Gtt, with improvement of increase work of breathing.  She remain in morphine gtt. Patient died September 12, 2023 at 66;   Principal Problem:   Severe sepsis with septic shock ([place]) Active Problems:   Acute encephalopathy   Dementia with behavioral disturbance   Acute kidney failure ([facility])   Anemia   Goals of care, counseling/discussion   Tachypnea   End of life care  1-Acute respiratory failure, tachypnea, COPD; exacerbation. Treated for PNA also.  Treated with  IV solumedrol to Q 8 hours. Nebulizer treatment. IV antibiotics.  Chest xray negative for pulmonary edema. Has hypernatremia. Avoid lasix.  PRN ativan for anxiety.  DNR,  no BIPAP.  Patient didn't improved with medical treatment. Family met with family, decision was made for full comfort care. Patient was started on morphine Gtt, with improvement of increase work of breathing.  No significant changes. Await palliative follow up today , to determine transfer to residential hospice.   Acute encephalopathy; toxic vs metabolic.  Dementia with behavioral disturbance Treated with IV meropenem.  IV ativan and haldol PRN.  TSH, B 12 normal,  RPR negative.  No improvement with treatment of infection, and support care. No tube feeding per family.   Hypotension; Resolved with IV fluids.  Related to hypovolemia vs medications.  V-Q scan negative.  Low grade fever, UA with 50,000 VRE, continue with antibiotics.  MRSA negative, stop IV Vancomycin.   Sepsis; had hypotension, treating for UTI. Urine grew ESBL 50,000 Treated with IV fluids and IV antibiotics.  BP stabilized.   Hypernatremia; treated initially with D 5 IV fluids.   Dysphagia; fail swallow evaluation.  NPO except med.   Acute renal failure;  Related to hypovolemia,, hypotension.  Improved with fluids.   H/o severe alcohol abuse in the past  Tachy acardia; IV metoprolol/   Anemia- recent GI bleed - chronic- stool hemoccult was negativethis time - Niferex on hold  Mild colitis on CT scan; on IV antibiotics.     Time: 23  Signed:  Juley Giovanetti A Zalaya Astarita  [facility] Sep 18, 2017, 4:36 PM

## 2017-09-22 NOTE — Progress Notes (Signed)
Wasted Morphine into the sink approximately 100ml with ChiropractorCarla RN.

## 2017-09-22 DEATH — deceased

## 2019-09-18 IMAGING — DX DG CHEST 1V PORT
1 series · 1 of 1 positions shown · non-contrast
Comparison: Chest CT dated 05/14/2017. Chest radiographs dated
05/13/2017.

CLINICAL DATA: Weakness.  Cough.  Shortness of breath.  Smoker.

EXAM:
PORTABLE CHEST 1 VIEW

[chest ap]
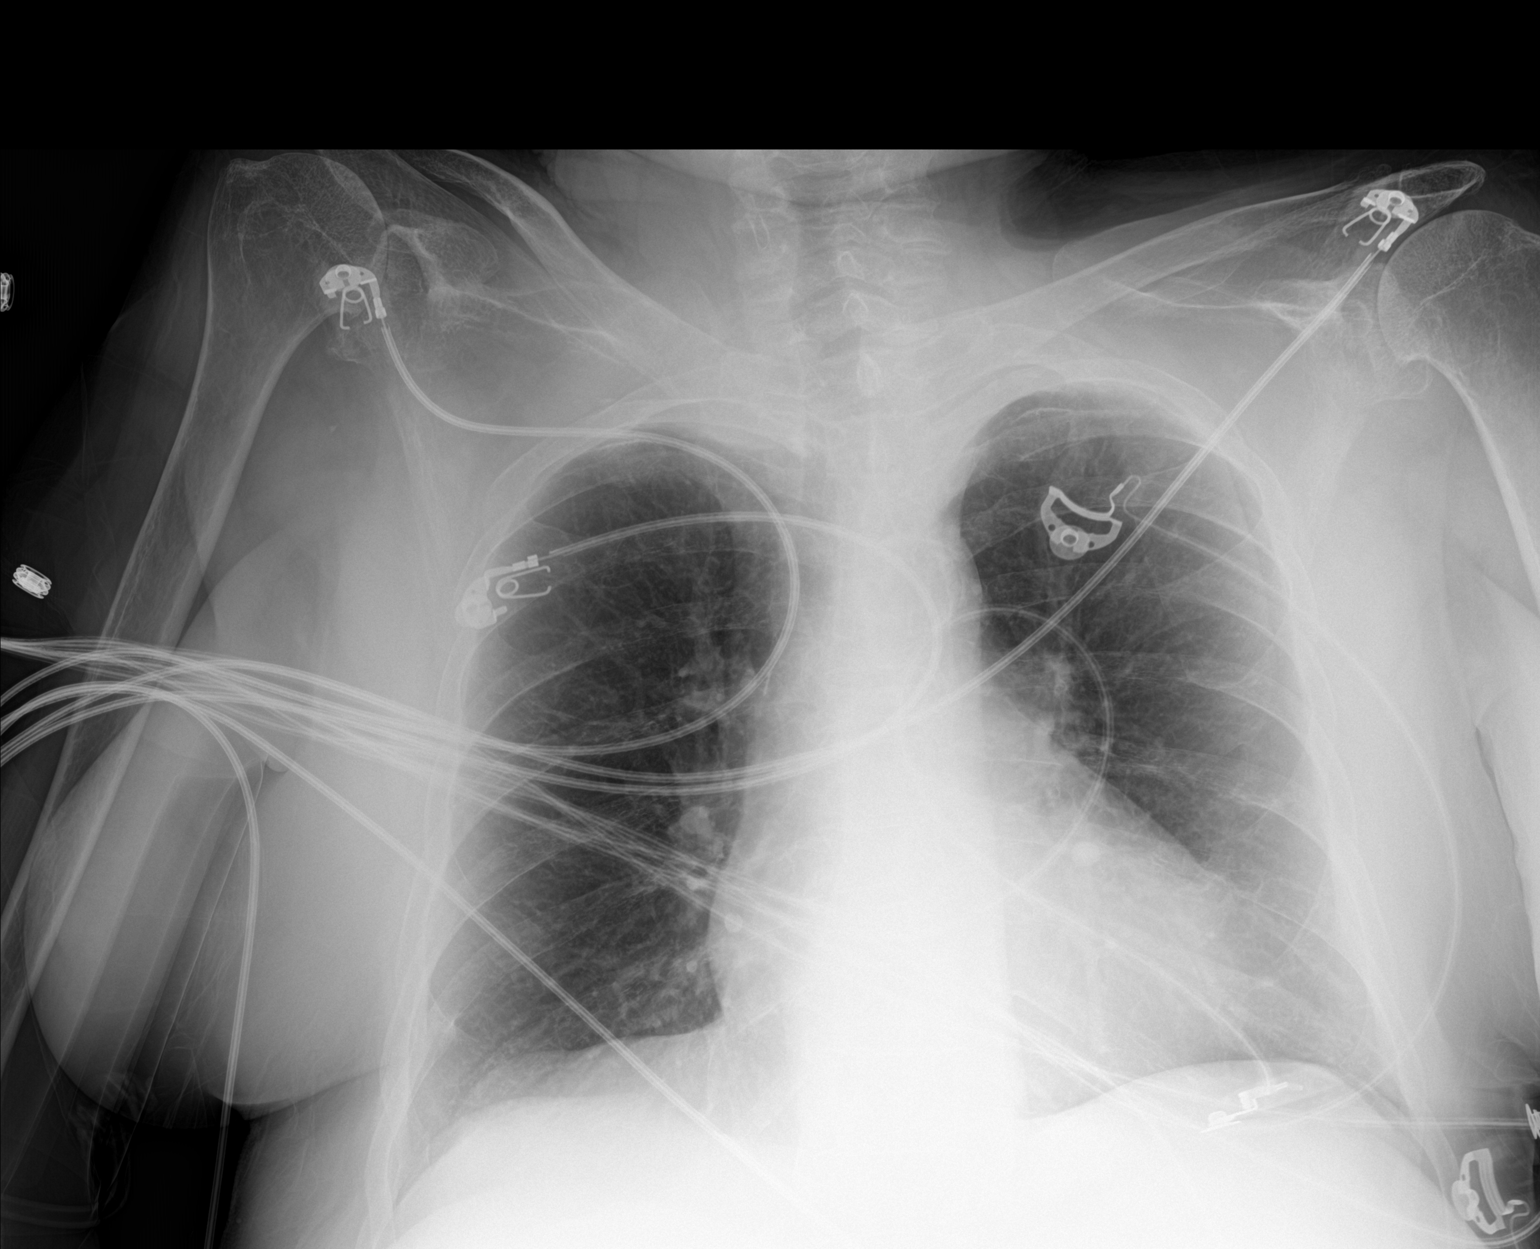

[1 of 1 positions shown; findings below may reference images not displayed]

FINDINGS: Borderline enlarged cardiac silhouette. Clear lungs with normal
vascularity. Old, healed right eighth rib fracture. Diffuse
osteopenia.
IMPRESSION: No acute abnormality.

## 2019-09-19 IMAGING — CT CT CHEST W/O CM
2 of 3 series · 15 of 36 positions shown, 18 images · non-contrast
Comparison: Portable chest 07/17/2017.  CTs 05/14/2017.

CLINICAL DATA: Leukocytosis. Weakness and abdominal symptoms. No
reported fever or cough. Sepsis workup.

EXAM:
CT CHEST WITHOUT CONTRAST
TECHNIQUE: Multidetector CT imaging of the chest was performed following the
standard protocol without IV contrast.

[Series 2: thorax · axial · 0.64mm/px · z∈[-311,-81]mm · 12 of 136 slices shown, 15 images]
[im 11/136  mediastinal]
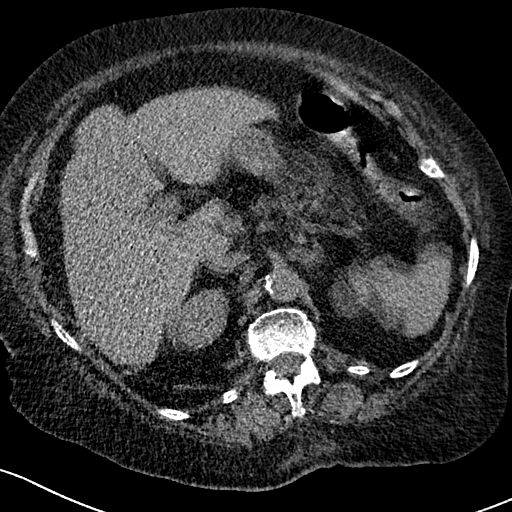
[im 11/136  lung]
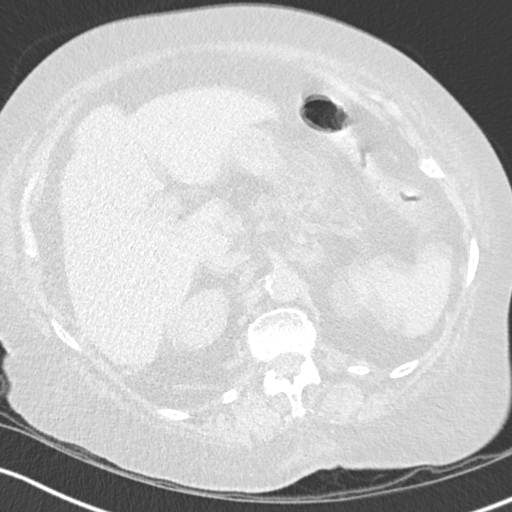
[im 21/136  lung]
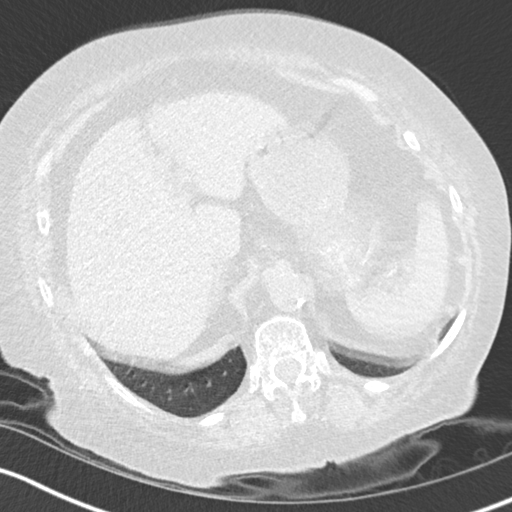
[im 31/136  lung]
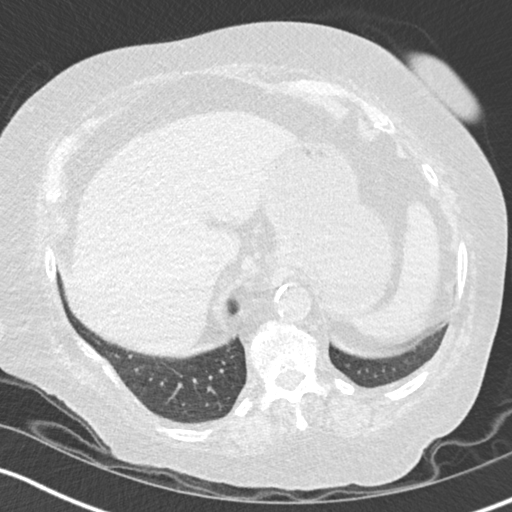
[im 41/136  lung]
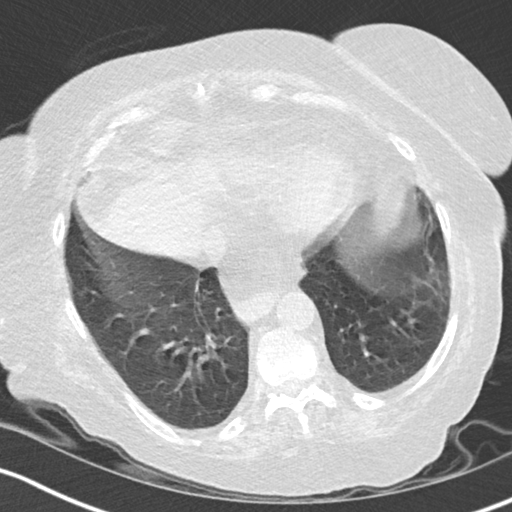
[im 51/136  mediastinal]
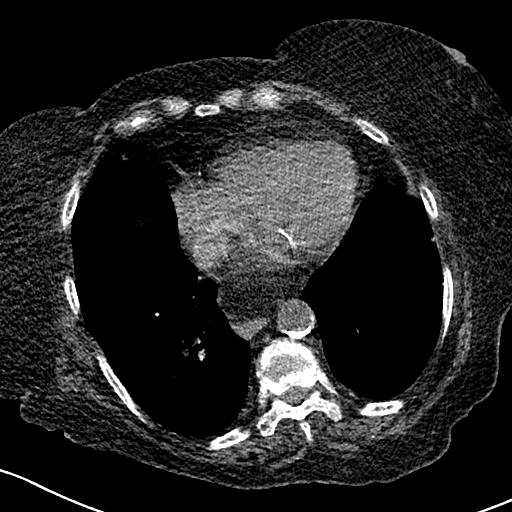
[im 51/136  lung]
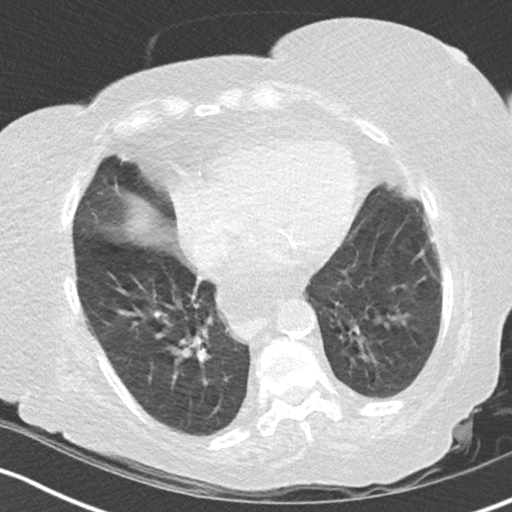
[im 61/136  lung]
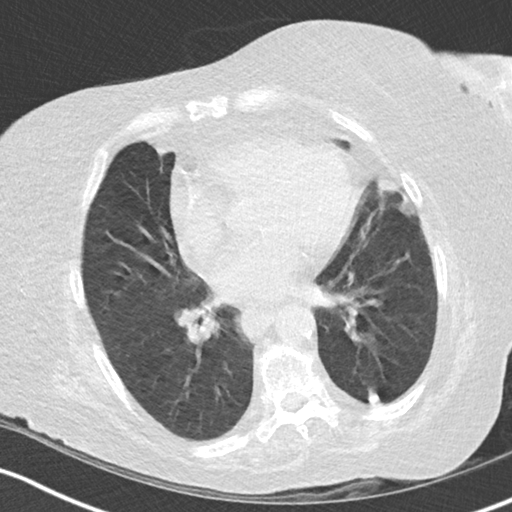
[im 76/136  lung]
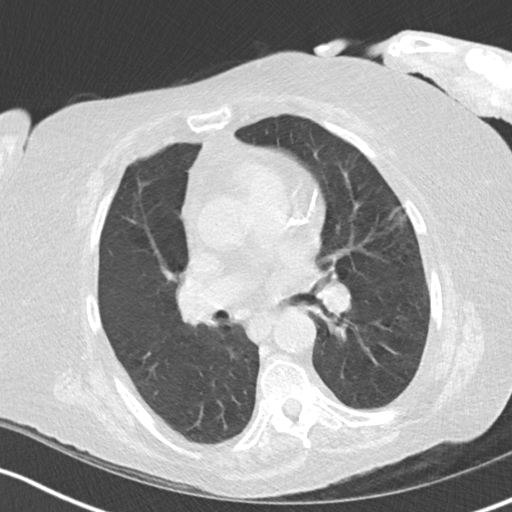
[im 86/136  lung]
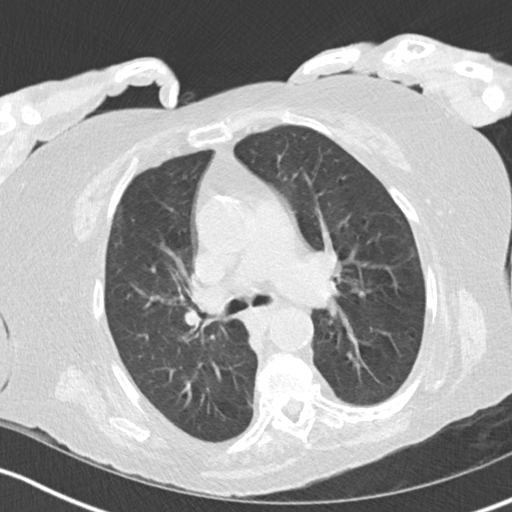
[im 96/136  mediastinal]
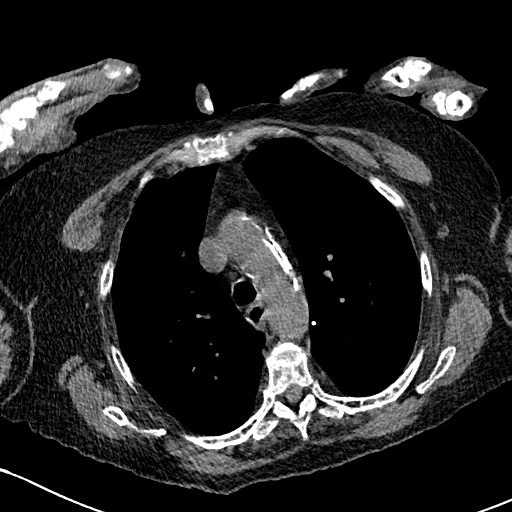
[im 96/136  lung]
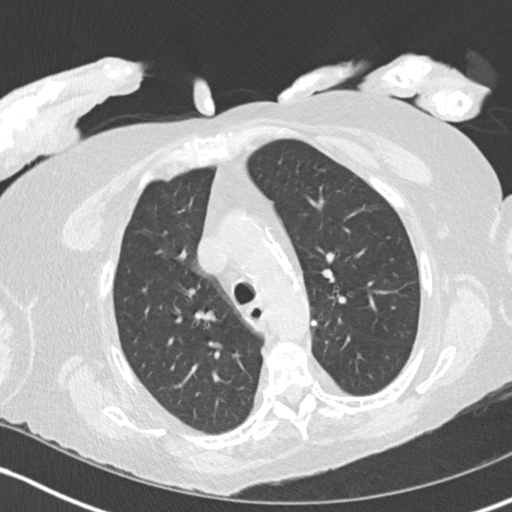
[im 106/136  lung]
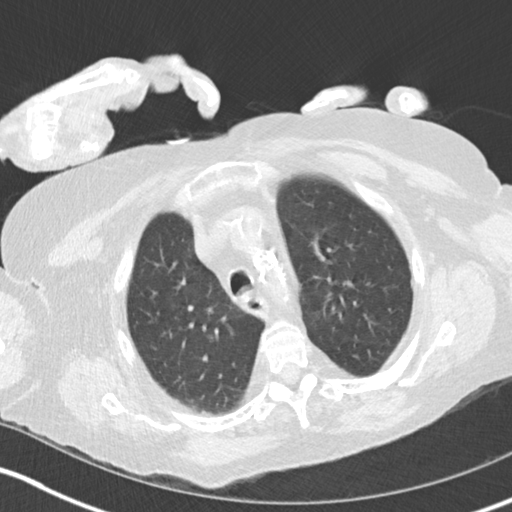
[im 116/136  lung]
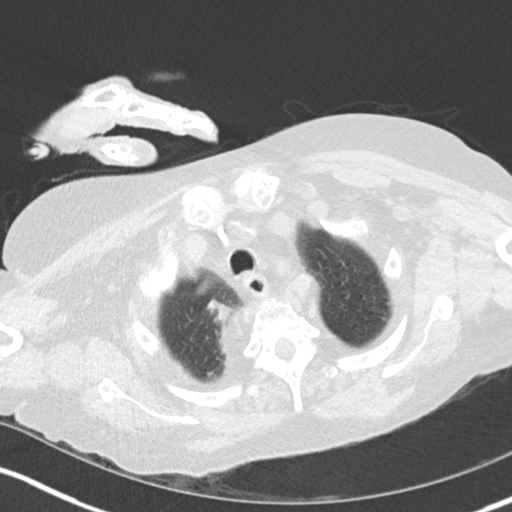
[im 126/136  lung]
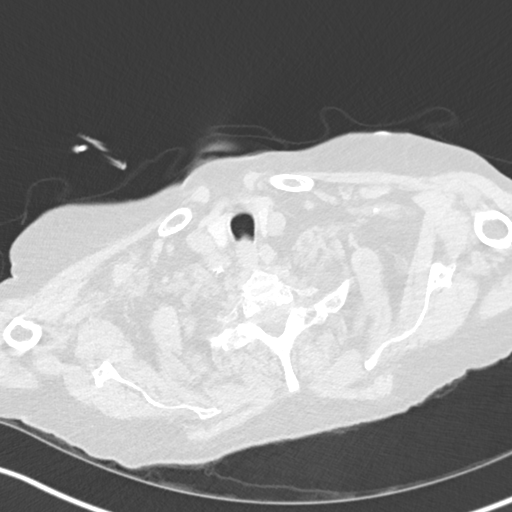

[Series 6: coronal · coronal · 0.57mm/px · 3 of 151 slices shown]
[im 31/151  lung]
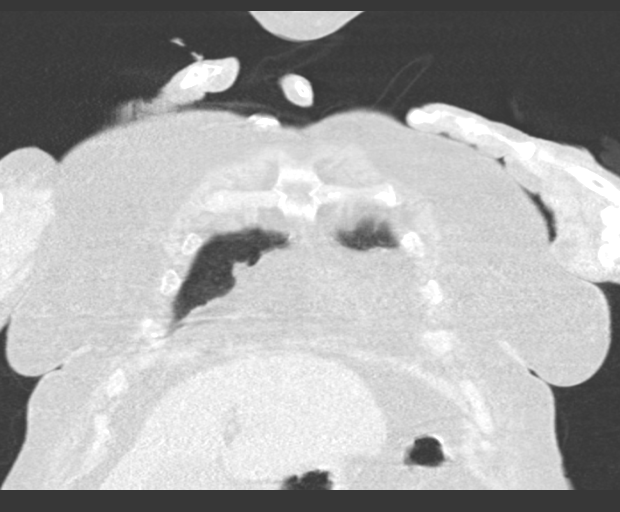
[im 61/151  lung]
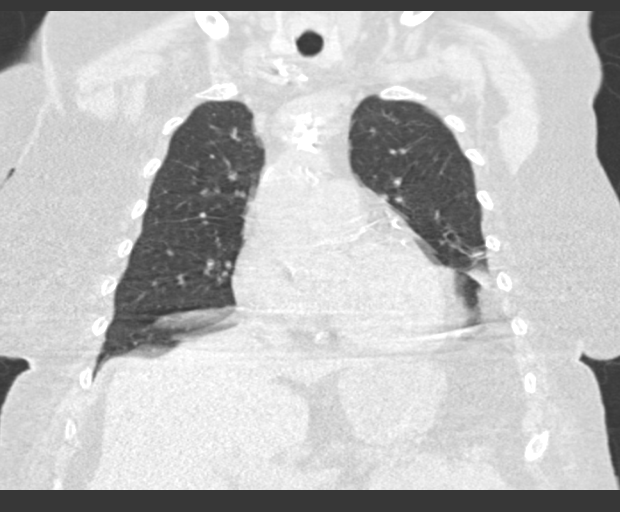
[im 91/151  lung]
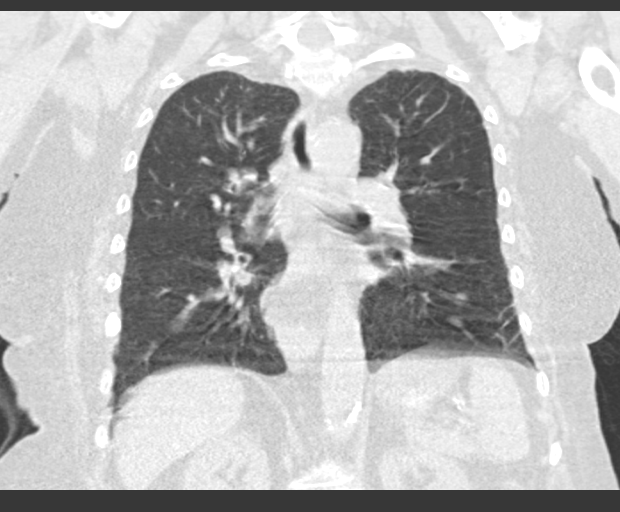

[15 of 36 positions shown; findings below may reference images not displayed]

FINDINGS: Cardiovascular: Again demonstrated is extensive atherosclerosis of
the aorta, great vessels and coronary arteries. No acute vascular
findings are seen on noncontrast imaging. The heart size is normal.
There is no pericardial effusion.

Mediastinum/Nodes: There are no enlarged mediastinal, hilar or
axillary lymph nodes.Hilar assessment is limited by the lack of
intravenous contrast, although the hilar contours appear unchanged.
The thyroid gland, trachea and esophagus demonstrate no significant
findings. There is a small fat containing hiatal hernia.

Lungs/Pleura: There is no pleural effusion. Stable mild atelectasis
or scarring in the lingula and right upper lobe. There are calcified
left lower lobe granulomas. No suspicious pulmonary nodule or
confluent airspace opacity.

Upper abdomen: Hepatic low density consistent with steatosis. Aortic
and branch vessel atherosclerosis. No acute findings.

Musculoskeletal/Chest wall: Multiple thoracic compression
deformities are again noted, unchanged from previous CT. These
involve the T4, T5, T6 and T9 vertebral bodies. No acute osseous
findings or chest wall masses identified.
IMPRESSION: 1. No acute findings or explanation for the patient's symptoms.
Stable appearance of the lungs with mild atelectasis or scarring. No
infiltrate.
2. Extensive atherosclerosis, including Aortic Atherosclerosis
([id number]).
3. Stable thoracic compression deformities.
